# Patient Record
Sex: Female | Born: 1945
Health system: Southern US, Community
[De-identification: ages and names within clinical notes are randomized; demographics above are authoritative.]

## PROBLEM LIST (undated history)

## (undated) DIAGNOSIS — K551 Chronic vascular disorders of intestine: Secondary | ICD-10-CM

## (undated) DIAGNOSIS — I771 Stricture of artery: Secondary | ICD-10-CM

## (undated) DIAGNOSIS — J449 Chronic obstructive pulmonary disease, unspecified: Secondary | ICD-10-CM

## (undated) DIAGNOSIS — I739 Peripheral vascular disease, unspecified: Secondary | ICD-10-CM

## (undated) DIAGNOSIS — U071 COVID-19: Secondary | ICD-10-CM

## (undated) HISTORY — PX: AORTO-FEMORAL BYPASS GRAFT: SHX885

---

## 2013-06-02 DIAGNOSIS — M545 Low back pain: Secondary | ICD-10-CM | POA: Diagnosis not present

## 2013-06-02 DIAGNOSIS — G8929 Other chronic pain: Secondary | ICD-10-CM | POA: Diagnosis not present

## 2013-06-02 DIAGNOSIS — F329 Major depressive disorder, single episode, unspecified: Secondary | ICD-10-CM | POA: Diagnosis not present

## 2013-06-02 DIAGNOSIS — F17200 Nicotine dependence, unspecified, uncomplicated: Secondary | ICD-10-CM | POA: Diagnosis not present

## 2013-06-08 DIAGNOSIS — Z79899 Other long term (current) drug therapy: Secondary | ICD-10-CM | POA: Diagnosis not present

## 2013-06-15 DIAGNOSIS — D649 Anemia, unspecified: Secondary | ICD-10-CM | POA: Diagnosis not present

## 2013-07-01 DIAGNOSIS — D649 Anemia, unspecified: Secondary | ICD-10-CM | POA: Diagnosis not present

## 2013-07-01 DIAGNOSIS — D45 Polycythemia vera: Secondary | ICD-10-CM | POA: Diagnosis not present

## 2013-07-05 DIAGNOSIS — Z0000 Encounter for general adult medical examination without abnormal findings: Secondary | ICD-10-CM | POA: Diagnosis not present

## 2013-07-08 DIAGNOSIS — J40 Bronchitis, not specified as acute or chronic: Secondary | ICD-10-CM | POA: Diagnosis not present

## 2013-07-08 DIAGNOSIS — D45 Polycythemia vera: Secondary | ICD-10-CM | POA: Diagnosis not present

## 2013-07-08 DIAGNOSIS — R0602 Shortness of breath: Secondary | ICD-10-CM | POA: Diagnosis not present

## 2013-07-08 DIAGNOSIS — F17200 Nicotine dependence, unspecified, uncomplicated: Secondary | ICD-10-CM | POA: Diagnosis not present

## 2013-07-27 DIAGNOSIS — I10 Essential (primary) hypertension: Secondary | ICD-10-CM | POA: Diagnosis not present

## 2013-07-27 DIAGNOSIS — R9431 Abnormal electrocardiogram [ECG] [EKG]: Secondary | ICD-10-CM | POA: Diagnosis not present

## 2013-07-27 DIAGNOSIS — F17200 Nicotine dependence, unspecified, uncomplicated: Secondary | ICD-10-CM | POA: Diagnosis not present

## 2013-07-27 DIAGNOSIS — I739 Peripheral vascular disease, unspecified: Secondary | ICD-10-CM | POA: Diagnosis not present

## 2013-08-08 DIAGNOSIS — R9431 Abnormal electrocardiogram [ECG] [EKG]: Secondary | ICD-10-CM | POA: Diagnosis not present

## 2013-08-30 DIAGNOSIS — I119 Hypertensive heart disease without heart failure: Secondary | ICD-10-CM | POA: Diagnosis not present

## 2013-09-27 DIAGNOSIS — I70219 Atherosclerosis of native arteries of extremities with intermittent claudication, unspecified extremity: Secondary | ICD-10-CM | POA: Diagnosis not present

## 2013-09-29 DIAGNOSIS — R9431 Abnormal electrocardiogram [ECG] [EKG]: Secondary | ICD-10-CM | POA: Diagnosis not present

## 2013-09-29 DIAGNOSIS — I739 Peripheral vascular disease, unspecified: Secondary | ICD-10-CM | POA: Diagnosis not present

## 2013-09-29 DIAGNOSIS — I10 Essential (primary) hypertension: Secondary | ICD-10-CM | POA: Diagnosis not present

## 2013-11-04 DIAGNOSIS — I70219 Atherosclerosis of native arteries of extremities with intermittent claudication, unspecified extremity: Secondary | ICD-10-CM | POA: Diagnosis not present

## 2013-11-09 DIAGNOSIS — D45 Polycythemia vera: Secondary | ICD-10-CM | POA: Diagnosis not present

## 2013-11-09 DIAGNOSIS — E785 Hyperlipidemia, unspecified: Secondary | ICD-10-CM | POA: Diagnosis not present

## 2013-11-09 DIAGNOSIS — I10 Essential (primary) hypertension: Secondary | ICD-10-CM | POA: Diagnosis not present

## 2013-11-09 DIAGNOSIS — Z87891 Personal history of nicotine dependence: Secondary | ICD-10-CM | POA: Diagnosis not present

## 2013-11-09 DIAGNOSIS — I739 Peripheral vascular disease, unspecified: Secondary | ICD-10-CM | POA: Diagnosis not present

## 2013-12-07 DIAGNOSIS — I10 Essential (primary) hypertension: Secondary | ICD-10-CM | POA: Diagnosis not present

## 2013-12-07 DIAGNOSIS — I739 Peripheral vascular disease, unspecified: Secondary | ICD-10-CM | POA: Diagnosis not present

## 2013-12-07 DIAGNOSIS — E785 Hyperlipidemia, unspecified: Secondary | ICD-10-CM | POA: Diagnosis not present

## 2013-12-12 DIAGNOSIS — I739 Peripheral vascular disease, unspecified: Secondary | ICD-10-CM | POA: Diagnosis not present

## 2013-12-12 DIAGNOSIS — E785 Hyperlipidemia, unspecified: Secondary | ICD-10-CM | POA: Diagnosis not present

## 2013-12-12 DIAGNOSIS — F17200 Nicotine dependence, unspecified, uncomplicated: Secondary | ICD-10-CM | POA: Diagnosis not present

## 2013-12-12 DIAGNOSIS — I10 Essential (primary) hypertension: Secondary | ICD-10-CM | POA: Diagnosis not present

## 2013-12-26 DIAGNOSIS — Z79899 Other long term (current) drug therapy: Secondary | ICD-10-CM | POA: Diagnosis not present

## 2013-12-26 DIAGNOSIS — F17200 Nicotine dependence, unspecified, uncomplicated: Secondary | ICD-10-CM | POA: Diagnosis not present

## 2013-12-26 DIAGNOSIS — E785 Hyperlipidemia, unspecified: Secondary | ICD-10-CM | POA: Diagnosis not present

## 2013-12-26 DIAGNOSIS — Z7982 Long term (current) use of aspirin: Secondary | ICD-10-CM | POA: Diagnosis not present

## 2013-12-26 DIAGNOSIS — I700 Atherosclerosis of aorta: Secondary | ICD-10-CM | POA: Diagnosis not present

## 2013-12-26 DIAGNOSIS — Z886 Allergy status to analgesic agent status: Secondary | ICD-10-CM | POA: Diagnosis not present

## 2013-12-26 DIAGNOSIS — I70219 Atherosclerosis of native arteries of extremities with intermittent claudication, unspecified extremity: Secondary | ICD-10-CM | POA: Diagnosis not present

## 2013-12-26 DIAGNOSIS — I10 Essential (primary) hypertension: Secondary | ICD-10-CM | POA: Diagnosis not present

## 2013-12-26 DIAGNOSIS — I7409 Other arterial embolism and thrombosis of abdominal aorta: Secondary | ICD-10-CM | POA: Diagnosis not present

## 2013-12-26 DIAGNOSIS — Z7902 Long term (current) use of antithrombotics/antiplatelets: Secondary | ICD-10-CM | POA: Diagnosis not present

## 2013-12-26 DIAGNOSIS — Z91041 Radiographic dye allergy status: Secondary | ICD-10-CM | POA: Diagnosis not present

## 2014-01-05 DIAGNOSIS — I509 Heart failure, unspecified: Secondary | ICD-10-CM | POA: Diagnosis not present

## 2014-01-05 DIAGNOSIS — F17200 Nicotine dependence, unspecified, uncomplicated: Secondary | ICD-10-CM | POA: Diagnosis not present

## 2014-01-05 DIAGNOSIS — I110 Hypertensive heart disease with heart failure: Secondary | ICD-10-CM | POA: Diagnosis not present

## 2014-01-05 DIAGNOSIS — I739 Peripheral vascular disease, unspecified: Secondary | ICD-10-CM | POA: Diagnosis not present

## 2014-01-05 DIAGNOSIS — I5032 Chronic diastolic (congestive) heart failure: Secondary | ICD-10-CM | POA: Diagnosis not present

## 2014-01-12 DIAGNOSIS — I10 Essential (primary) hypertension: Secondary | ICD-10-CM | POA: Diagnosis not present

## 2014-01-12 DIAGNOSIS — R0989 Other specified symptoms and signs involving the circulatory and respiratory systems: Secondary | ICD-10-CM | POA: Diagnosis not present

## 2014-01-12 DIAGNOSIS — R109 Unspecified abdominal pain: Secondary | ICD-10-CM | POA: Diagnosis not present

## 2014-01-12 DIAGNOSIS — Q2529 Other atresia of aorta: Secondary | ICD-10-CM | POA: Diagnosis not present

## 2014-01-12 DIAGNOSIS — I739 Peripheral vascular disease, unspecified: Secondary | ICD-10-CM | POA: Diagnosis not present

## 2014-01-12 DIAGNOSIS — F17200 Nicotine dependence, unspecified, uncomplicated: Secondary | ICD-10-CM | POA: Diagnosis not present

## 2014-01-12 DIAGNOSIS — E785 Hyperlipidemia, unspecified: Secondary | ICD-10-CM | POA: Diagnosis not present

## 2014-01-12 DIAGNOSIS — Q251 Coarctation of aorta: Secondary | ICD-10-CM | POA: Diagnosis not present

## 2014-02-02 DIAGNOSIS — I110 Hypertensive heart disease with heart failure: Secondary | ICD-10-CM | POA: Diagnosis not present

## 2014-02-02 DIAGNOSIS — I739 Peripheral vascular disease, unspecified: Secondary | ICD-10-CM | POA: Diagnosis not present

## 2014-02-02 DIAGNOSIS — I509 Heart failure, unspecified: Secondary | ICD-10-CM | POA: Diagnosis not present

## 2014-02-02 DIAGNOSIS — J449 Chronic obstructive pulmonary disease, unspecified: Secondary | ICD-10-CM | POA: Diagnosis not present

## 2014-02-15 DIAGNOSIS — R109 Unspecified abdominal pain: Secondary | ICD-10-CM | POA: Diagnosis not present

## 2014-02-15 DIAGNOSIS — I658 Occlusion and stenosis of other precerebral arteries: Secondary | ICD-10-CM | POA: Diagnosis not present

## 2014-02-16 DIAGNOSIS — F17200 Nicotine dependence, unspecified, uncomplicated: Secondary | ICD-10-CM | POA: Diagnosis not present

## 2014-02-16 DIAGNOSIS — K551 Chronic vascular disorders of intestine: Secondary | ICD-10-CM | POA: Diagnosis not present

## 2014-02-16 DIAGNOSIS — E785 Hyperlipidemia, unspecified: Secondary | ICD-10-CM | POA: Diagnosis not present

## 2014-02-16 DIAGNOSIS — I739 Peripheral vascular disease, unspecified: Secondary | ICD-10-CM | POA: Diagnosis not present

## 2014-02-16 DIAGNOSIS — I6529 Occlusion and stenosis of unspecified carotid artery: Secondary | ICD-10-CM | POA: Diagnosis not present

## 2014-02-16 DIAGNOSIS — Q251 Coarctation of aorta: Secondary | ICD-10-CM | POA: Diagnosis not present

## 2014-02-16 DIAGNOSIS — Q2529 Other atresia of aorta: Secondary | ICD-10-CM | POA: Diagnosis not present

## 2014-02-16 DIAGNOSIS — I10 Essential (primary) hypertension: Secondary | ICD-10-CM | POA: Diagnosis not present

## 2014-02-21 DIAGNOSIS — I110 Hypertensive heart disease with heart failure: Secondary | ICD-10-CM | POA: Diagnosis not present

## 2014-02-21 DIAGNOSIS — J449 Chronic obstructive pulmonary disease, unspecified: Secondary | ICD-10-CM | POA: Diagnosis not present

## 2014-02-21 DIAGNOSIS — I739 Peripheral vascular disease, unspecified: Secondary | ICD-10-CM | POA: Diagnosis not present

## 2014-02-21 DIAGNOSIS — I509 Heart failure, unspecified: Secondary | ICD-10-CM | POA: Diagnosis not present

## 2014-02-27 DIAGNOSIS — I10 Essential (primary) hypertension: Secondary | ICD-10-CM | POA: Diagnosis not present

## 2014-02-27 DIAGNOSIS — K551 Chronic vascular disorders of intestine: Secondary | ICD-10-CM | POA: Diagnosis not present

## 2014-02-27 DIAGNOSIS — Z5309 Procedure and treatment not carried out because of other contraindication: Secondary | ICD-10-CM | POA: Diagnosis not present

## 2014-02-27 DIAGNOSIS — I739 Peripheral vascular disease, unspecified: Secondary | ICD-10-CM | POA: Diagnosis not present

## 2014-02-27 DIAGNOSIS — E7800 Pure hypercholesterolemia, unspecified: Secondary | ICD-10-CM | POA: Diagnosis not present

## 2014-02-27 DIAGNOSIS — Z886 Allergy status to analgesic agent status: Secondary | ICD-10-CM | POA: Diagnosis not present

## 2014-02-27 DIAGNOSIS — Z01818 Encounter for other preprocedural examination: Secondary | ICD-10-CM | POA: Diagnosis not present

## 2014-02-28 DIAGNOSIS — K551 Chronic vascular disorders of intestine: Secondary | ICD-10-CM | POA: Diagnosis not present

## 2014-02-28 DIAGNOSIS — Z01818 Encounter for other preprocedural examination: Secondary | ICD-10-CM | POA: Diagnosis not present

## 2014-02-28 DIAGNOSIS — I10 Essential (primary) hypertension: Secondary | ICD-10-CM | POA: Diagnosis not present

## 2014-02-28 DIAGNOSIS — I739 Peripheral vascular disease, unspecified: Secondary | ICD-10-CM | POA: Diagnosis not present

## 2014-02-28 DIAGNOSIS — E7800 Pure hypercholesterolemia, unspecified: Secondary | ICD-10-CM | POA: Diagnosis not present

## 2014-02-28 DIAGNOSIS — Z5309 Procedure and treatment not carried out because of other contraindication: Secondary | ICD-10-CM | POA: Diagnosis not present

## 2014-03-01 DIAGNOSIS — R269 Unspecified abnormalities of gait and mobility: Secondary | ICD-10-CM | POA: Diagnosis not present

## 2014-03-01 DIAGNOSIS — F17200 Nicotine dependence, unspecified, uncomplicated: Secondary | ICD-10-CM | POA: Diagnosis not present

## 2014-03-01 DIAGNOSIS — I110 Hypertensive heart disease with heart failure: Secondary | ICD-10-CM | POA: Diagnosis not present

## 2014-03-01 DIAGNOSIS — J449 Chronic obstructive pulmonary disease, unspecified: Secondary | ICD-10-CM | POA: Diagnosis not present

## 2014-03-01 DIAGNOSIS — I5032 Chronic diastolic (congestive) heart failure: Secondary | ICD-10-CM | POA: Diagnosis not present

## 2014-03-01 DIAGNOSIS — I739 Peripheral vascular disease, unspecified: Secondary | ICD-10-CM | POA: Diagnosis not present

## 2014-03-21 DIAGNOSIS — F17200 Nicotine dependence, unspecified, uncomplicated: Secondary | ICD-10-CM | POA: Diagnosis not present

## 2014-03-21 DIAGNOSIS — K551 Chronic vascular disorders of intestine: Secondary | ICD-10-CM | POA: Diagnosis not present

## 2014-03-21 DIAGNOSIS — I70219 Atherosclerosis of native arteries of extremities with intermittent claudication, unspecified extremity: Secondary | ICD-10-CM | POA: Diagnosis not present

## 2014-03-21 DIAGNOSIS — Z79899 Other long term (current) drug therapy: Secondary | ICD-10-CM | POA: Diagnosis not present

## 2014-03-21 DIAGNOSIS — Z90710 Acquired absence of both cervix and uterus: Secondary | ICD-10-CM | POA: Diagnosis not present

## 2014-03-21 DIAGNOSIS — I739 Peripheral vascular disease, unspecified: Secondary | ICD-10-CM | POA: Diagnosis not present

## 2014-03-21 DIAGNOSIS — E785 Hyperlipidemia, unspecified: Secondary | ICD-10-CM | POA: Diagnosis not present

## 2014-03-21 DIAGNOSIS — Z9889 Other specified postprocedural states: Secondary | ICD-10-CM | POA: Diagnosis not present

## 2014-03-21 DIAGNOSIS — I771 Stricture of artery: Secondary | ICD-10-CM | POA: Diagnosis not present

## 2014-03-21 DIAGNOSIS — I6529 Occlusion and stenosis of unspecified carotid artery: Secondary | ICD-10-CM | POA: Diagnosis not present

## 2014-03-21 DIAGNOSIS — Z01818 Encounter for other preprocedural examination: Secondary | ICD-10-CM | POA: Diagnosis not present

## 2014-03-21 DIAGNOSIS — Z7982 Long term (current) use of aspirin: Secondary | ICD-10-CM | POA: Diagnosis not present

## 2014-03-21 DIAGNOSIS — I10 Essential (primary) hypertension: Secondary | ICD-10-CM | POA: Diagnosis not present

## 2014-03-22 DIAGNOSIS — I10 Essential (primary) hypertension: Secondary | ICD-10-CM | POA: Diagnosis not present

## 2014-03-22 DIAGNOSIS — Z79899 Other long term (current) drug therapy: Secondary | ICD-10-CM | POA: Diagnosis not present

## 2014-03-22 DIAGNOSIS — I6529 Occlusion and stenosis of unspecified carotid artery: Secondary | ICD-10-CM | POA: Diagnosis not present

## 2014-03-22 DIAGNOSIS — K551 Chronic vascular disorders of intestine: Secondary | ICD-10-CM | POA: Diagnosis not present

## 2014-03-22 DIAGNOSIS — E785 Hyperlipidemia, unspecified: Secondary | ICD-10-CM | POA: Diagnosis not present

## 2014-03-22 DIAGNOSIS — I739 Peripheral vascular disease, unspecified: Secondary | ICD-10-CM | POA: Diagnosis not present

## 2014-03-22 DIAGNOSIS — F17200 Nicotine dependence, unspecified, uncomplicated: Secondary | ICD-10-CM | POA: Diagnosis not present

## 2014-03-22 DIAGNOSIS — I70219 Atherosclerosis of native arteries of extremities with intermittent claudication, unspecified extremity: Secondary | ICD-10-CM | POA: Diagnosis not present

## 2014-03-30 DIAGNOSIS — I509 Heart failure, unspecified: Secondary | ICD-10-CM | POA: Diagnosis not present

## 2014-03-30 DIAGNOSIS — I110 Hypertensive heart disease with heart failure: Secondary | ICD-10-CM | POA: Diagnosis not present

## 2014-03-30 DIAGNOSIS — I739 Peripheral vascular disease, unspecified: Secondary | ICD-10-CM | POA: Diagnosis not present

## 2014-03-30 DIAGNOSIS — F17200 Nicotine dependence, unspecified, uncomplicated: Secondary | ICD-10-CM | POA: Diagnosis not present

## 2014-04-05 DIAGNOSIS — R197 Diarrhea, unspecified: Secondary | ICD-10-CM | POA: Diagnosis not present

## 2014-04-10 DIAGNOSIS — R634 Abnormal weight loss: Secondary | ICD-10-CM | POA: Diagnosis not present

## 2014-04-10 DIAGNOSIS — I739 Peripheral vascular disease, unspecified: Secondary | ICD-10-CM | POA: Diagnosis not present

## 2014-04-10 DIAGNOSIS — R197 Diarrhea, unspecified: Secondary | ICD-10-CM | POA: Diagnosis not present

## 2014-04-10 DIAGNOSIS — K551 Chronic vascular disorders of intestine: Secondary | ICD-10-CM | POA: Diagnosis not present

## 2014-04-25 DIAGNOSIS — R1031 Right lower quadrant pain: Secondary | ICD-10-CM | POA: Diagnosis not present

## 2014-04-25 DIAGNOSIS — R197 Diarrhea, unspecified: Secondary | ICD-10-CM | POA: Diagnosis not present

## 2014-04-25 DIAGNOSIS — R634 Abnormal weight loss: Secondary | ICD-10-CM | POA: Diagnosis not present

## 2014-04-30 DIAGNOSIS — I5032 Chronic diastolic (congestive) heart failure: Secondary | ICD-10-CM | POA: Diagnosis not present

## 2014-04-30 DIAGNOSIS — I119 Hypertensive heart disease without heart failure: Secondary | ICD-10-CM | POA: Diagnosis not present

## 2014-04-30 DIAGNOSIS — J449 Chronic obstructive pulmonary disease, unspecified: Secondary | ICD-10-CM | POA: Diagnosis not present

## 2014-04-30 DIAGNOSIS — I509 Heart failure, unspecified: Secondary | ICD-10-CM | POA: Diagnosis not present

## 2014-04-30 DIAGNOSIS — B9681 Helicobacter pylori [H. pylori] as the cause of diseases classified elsewhere: Secondary | ICD-10-CM | POA: Diagnosis not present

## 2014-04-30 DIAGNOSIS — I739 Peripheral vascular disease, unspecified: Secondary | ICD-10-CM | POA: Diagnosis not present

## 2014-05-02 DIAGNOSIS — I739 Peripheral vascular disease, unspecified: Secondary | ICD-10-CM | POA: Diagnosis not present

## 2014-05-02 DIAGNOSIS — Q2529 Other atresia of aorta: Secondary | ICD-10-CM | POA: Diagnosis not present

## 2014-05-02 DIAGNOSIS — K551 Chronic vascular disorders of intestine: Secondary | ICD-10-CM | POA: Diagnosis not present

## 2014-05-02 DIAGNOSIS — Q251 Coarctation of aorta: Secondary | ICD-10-CM | POA: Diagnosis not present

## 2014-05-03 DIAGNOSIS — I509 Heart failure, unspecified: Secondary | ICD-10-CM | POA: Diagnosis not present

## 2014-05-03 DIAGNOSIS — I119 Hypertensive heart disease without heart failure: Secondary | ICD-10-CM | POA: Diagnosis not present

## 2014-05-03 DIAGNOSIS — B9681 Helicobacter pylori [H. pylori] as the cause of diseases classified elsewhere: Secondary | ICD-10-CM | POA: Diagnosis not present

## 2014-05-03 DIAGNOSIS — I739 Peripheral vascular disease, unspecified: Secondary | ICD-10-CM | POA: Diagnosis not present

## 2014-05-03 DIAGNOSIS — J449 Chronic obstructive pulmonary disease, unspecified: Secondary | ICD-10-CM | POA: Diagnosis not present

## 2014-05-03 DIAGNOSIS — I5032 Chronic diastolic (congestive) heart failure: Secondary | ICD-10-CM | POA: Diagnosis not present

## 2014-05-04 DIAGNOSIS — J449 Chronic obstructive pulmonary disease, unspecified: Secondary | ICD-10-CM | POA: Diagnosis not present

## 2014-05-04 DIAGNOSIS — I119 Hypertensive heart disease without heart failure: Secondary | ICD-10-CM | POA: Diagnosis not present

## 2014-05-04 DIAGNOSIS — B9681 Helicobacter pylori [H. pylori] as the cause of diseases classified elsewhere: Secondary | ICD-10-CM | POA: Diagnosis not present

## 2014-05-04 DIAGNOSIS — I5032 Chronic diastolic (congestive) heart failure: Secondary | ICD-10-CM | POA: Diagnosis not present

## 2014-05-04 DIAGNOSIS — I739 Peripheral vascular disease, unspecified: Secondary | ICD-10-CM | POA: Diagnosis not present

## 2014-05-04 DIAGNOSIS — I509 Heart failure, unspecified: Secondary | ICD-10-CM | POA: Diagnosis not present

## 2014-05-05 DIAGNOSIS — I739 Peripheral vascular disease, unspecified: Secondary | ICD-10-CM | POA: Diagnosis not present

## 2014-05-05 DIAGNOSIS — I509 Heart failure, unspecified: Secondary | ICD-10-CM | POA: Diagnosis not present

## 2014-05-05 DIAGNOSIS — B9681 Helicobacter pylori [H. pylori] as the cause of diseases classified elsewhere: Secondary | ICD-10-CM | POA: Diagnosis not present

## 2014-05-05 DIAGNOSIS — J449 Chronic obstructive pulmonary disease, unspecified: Secondary | ICD-10-CM | POA: Diagnosis not present

## 2014-05-05 DIAGNOSIS — I5032 Chronic diastolic (congestive) heart failure: Secondary | ICD-10-CM | POA: Diagnosis not present

## 2014-05-05 DIAGNOSIS — I119 Hypertensive heart disease without heart failure: Secondary | ICD-10-CM | POA: Diagnosis not present

## 2014-05-09 DIAGNOSIS — J449 Chronic obstructive pulmonary disease, unspecified: Secondary | ICD-10-CM | POA: Diagnosis not present

## 2014-05-09 DIAGNOSIS — I739 Peripheral vascular disease, unspecified: Secondary | ICD-10-CM | POA: Diagnosis not present

## 2014-05-09 DIAGNOSIS — B9681 Helicobacter pylori [H. pylori] as the cause of diseases classified elsewhere: Secondary | ICD-10-CM | POA: Diagnosis not present

## 2014-05-09 DIAGNOSIS — I509 Heart failure, unspecified: Secondary | ICD-10-CM | POA: Diagnosis not present

## 2014-05-09 DIAGNOSIS — I5032 Chronic diastolic (congestive) heart failure: Secondary | ICD-10-CM | POA: Diagnosis not present

## 2014-05-09 DIAGNOSIS — I119 Hypertensive heart disease without heart failure: Secondary | ICD-10-CM | POA: Diagnosis not present

## 2014-05-10 DIAGNOSIS — R197 Diarrhea, unspecified: Secondary | ICD-10-CM | POA: Diagnosis not present

## 2014-05-11 DIAGNOSIS — I5032 Chronic diastolic (congestive) heart failure: Secondary | ICD-10-CM | POA: Diagnosis not present

## 2014-05-11 DIAGNOSIS — I739 Peripheral vascular disease, unspecified: Secondary | ICD-10-CM | POA: Diagnosis not present

## 2014-05-11 DIAGNOSIS — B9681 Helicobacter pylori [H. pylori] as the cause of diseases classified elsewhere: Secondary | ICD-10-CM | POA: Diagnosis not present

## 2014-05-11 DIAGNOSIS — I509 Heart failure, unspecified: Secondary | ICD-10-CM | POA: Diagnosis not present

## 2014-05-11 DIAGNOSIS — J449 Chronic obstructive pulmonary disease, unspecified: Secondary | ICD-10-CM | POA: Diagnosis not present

## 2014-05-11 DIAGNOSIS — I119 Hypertensive heart disease without heart failure: Secondary | ICD-10-CM | POA: Diagnosis not present

## 2014-05-12 DIAGNOSIS — I509 Heart failure, unspecified: Secondary | ICD-10-CM | POA: Diagnosis not present

## 2014-05-12 DIAGNOSIS — I739 Peripheral vascular disease, unspecified: Secondary | ICD-10-CM | POA: Diagnosis not present

## 2014-05-12 DIAGNOSIS — I119 Hypertensive heart disease without heart failure: Secondary | ICD-10-CM | POA: Diagnosis not present

## 2014-05-12 DIAGNOSIS — B9681 Helicobacter pylori [H. pylori] as the cause of diseases classified elsewhere: Secondary | ICD-10-CM | POA: Diagnosis not present

## 2014-05-12 DIAGNOSIS — I5032 Chronic diastolic (congestive) heart failure: Secondary | ICD-10-CM | POA: Diagnosis not present

## 2014-05-12 DIAGNOSIS — J449 Chronic obstructive pulmonary disease, unspecified: Secondary | ICD-10-CM | POA: Diagnosis not present

## 2014-05-17 DIAGNOSIS — I739 Peripheral vascular disease, unspecified: Secondary | ICD-10-CM | POA: Diagnosis not present

## 2014-05-17 DIAGNOSIS — I119 Hypertensive heart disease without heart failure: Secondary | ICD-10-CM | POA: Diagnosis not present

## 2014-05-17 DIAGNOSIS — J449 Chronic obstructive pulmonary disease, unspecified: Secondary | ICD-10-CM | POA: Diagnosis not present

## 2014-05-17 DIAGNOSIS — I509 Heart failure, unspecified: Secondary | ICD-10-CM | POA: Diagnosis not present

## 2014-05-17 DIAGNOSIS — I5032 Chronic diastolic (congestive) heart failure: Secondary | ICD-10-CM | POA: Diagnosis not present

## 2014-05-17 DIAGNOSIS — B9681 Helicobacter pylori [H. pylori] as the cause of diseases classified elsewhere: Secondary | ICD-10-CM | POA: Diagnosis not present

## 2014-05-18 DIAGNOSIS — I509 Heart failure, unspecified: Secondary | ICD-10-CM | POA: Diagnosis not present

## 2014-05-18 DIAGNOSIS — J449 Chronic obstructive pulmonary disease, unspecified: Secondary | ICD-10-CM | POA: Diagnosis not present

## 2014-05-18 DIAGNOSIS — I119 Hypertensive heart disease without heart failure: Secondary | ICD-10-CM | POA: Diagnosis not present

## 2014-05-18 DIAGNOSIS — I5032 Chronic diastolic (congestive) heart failure: Secondary | ICD-10-CM | POA: Diagnosis not present

## 2014-05-18 DIAGNOSIS — I739 Peripheral vascular disease, unspecified: Secondary | ICD-10-CM | POA: Diagnosis not present

## 2014-05-18 DIAGNOSIS — B9681 Helicobacter pylori [H. pylori] as the cause of diseases classified elsewhere: Secondary | ICD-10-CM | POA: Diagnosis not present

## 2014-05-19 DIAGNOSIS — I5032 Chronic diastolic (congestive) heart failure: Secondary | ICD-10-CM | POA: Diagnosis not present

## 2014-05-19 DIAGNOSIS — I739 Peripheral vascular disease, unspecified: Secondary | ICD-10-CM | POA: Diagnosis not present

## 2014-05-19 DIAGNOSIS — B9681 Helicobacter pylori [H. pylori] as the cause of diseases classified elsewhere: Secondary | ICD-10-CM | POA: Diagnosis not present

## 2014-05-19 DIAGNOSIS — J449 Chronic obstructive pulmonary disease, unspecified: Secondary | ICD-10-CM | POA: Diagnosis not present

## 2014-05-19 DIAGNOSIS — I509 Heart failure, unspecified: Secondary | ICD-10-CM | POA: Diagnosis not present

## 2014-05-19 DIAGNOSIS — I119 Hypertensive heart disease without heart failure: Secondary | ICD-10-CM | POA: Diagnosis not present

## 2014-05-22 DIAGNOSIS — I5032 Chronic diastolic (congestive) heart failure: Secondary | ICD-10-CM | POA: Diagnosis not present

## 2014-05-22 DIAGNOSIS — I119 Hypertensive heart disease without heart failure: Secondary | ICD-10-CM | POA: Diagnosis not present

## 2014-05-22 DIAGNOSIS — B9681 Helicobacter pylori [H. pylori] as the cause of diseases classified elsewhere: Secondary | ICD-10-CM | POA: Diagnosis not present

## 2014-05-22 DIAGNOSIS — J449 Chronic obstructive pulmonary disease, unspecified: Secondary | ICD-10-CM | POA: Diagnosis not present

## 2014-05-22 DIAGNOSIS — I509 Heart failure, unspecified: Secondary | ICD-10-CM | POA: Diagnosis not present

## 2014-05-22 DIAGNOSIS — I739 Peripheral vascular disease, unspecified: Secondary | ICD-10-CM | POA: Diagnosis not present

## 2014-05-23 DIAGNOSIS — I119 Hypertensive heart disease without heart failure: Secondary | ICD-10-CM | POA: Diagnosis not present

## 2014-05-23 DIAGNOSIS — I509 Heart failure, unspecified: Secondary | ICD-10-CM | POA: Diagnosis not present

## 2014-05-23 DIAGNOSIS — I739 Peripheral vascular disease, unspecified: Secondary | ICD-10-CM | POA: Diagnosis not present

## 2014-05-23 DIAGNOSIS — B9681 Helicobacter pylori [H. pylori] as the cause of diseases classified elsewhere: Secondary | ICD-10-CM | POA: Diagnosis not present

## 2014-05-23 DIAGNOSIS — I5032 Chronic diastolic (congestive) heart failure: Secondary | ICD-10-CM | POA: Diagnosis not present

## 2014-05-23 DIAGNOSIS — J449 Chronic obstructive pulmonary disease, unspecified: Secondary | ICD-10-CM | POA: Diagnosis not present

## 2014-05-25 DIAGNOSIS — I10 Essential (primary) hypertension: Secondary | ICD-10-CM | POA: Diagnosis not present

## 2014-05-25 DIAGNOSIS — J449 Chronic obstructive pulmonary disease, unspecified: Secondary | ICD-10-CM | POA: Diagnosis not present

## 2014-05-25 DIAGNOSIS — I739 Peripheral vascular disease, unspecified: Secondary | ICD-10-CM | POA: Diagnosis not present

## 2014-05-25 DIAGNOSIS — I771 Stricture of artery: Secondary | ICD-10-CM | POA: Diagnosis not present

## 2014-05-25 DIAGNOSIS — I509 Heart failure, unspecified: Secondary | ICD-10-CM | POA: Diagnosis not present

## 2014-05-25 DIAGNOSIS — B9681 Helicobacter pylori [H. pylori] as the cause of diseases classified elsewhere: Secondary | ICD-10-CM | POA: Diagnosis not present

## 2014-05-25 DIAGNOSIS — I119 Hypertensive heart disease without heart failure: Secondary | ICD-10-CM | POA: Diagnosis not present

## 2014-05-25 DIAGNOSIS — I5032 Chronic diastolic (congestive) heart failure: Secondary | ICD-10-CM | POA: Diagnosis not present

## 2014-05-25 DIAGNOSIS — Q253 Supravalvular aortic stenosis: Secondary | ICD-10-CM | POA: Diagnosis not present

## 2014-05-26 DIAGNOSIS — I739 Peripheral vascular disease, unspecified: Secondary | ICD-10-CM | POA: Diagnosis not present

## 2014-05-26 DIAGNOSIS — I5032 Chronic diastolic (congestive) heart failure: Secondary | ICD-10-CM | POA: Diagnosis not present

## 2014-05-26 DIAGNOSIS — J449 Chronic obstructive pulmonary disease, unspecified: Secondary | ICD-10-CM | POA: Diagnosis not present

## 2014-05-26 DIAGNOSIS — A09 Infectious gastroenteritis and colitis, unspecified: Secondary | ICD-10-CM | POA: Diagnosis not present

## 2014-05-26 DIAGNOSIS — I509 Heart failure, unspecified: Secondary | ICD-10-CM | POA: Diagnosis not present

## 2014-05-26 DIAGNOSIS — B9681 Helicobacter pylori [H. pylori] as the cause of diseases classified elsewhere: Secondary | ICD-10-CM | POA: Diagnosis not present

## 2014-05-26 DIAGNOSIS — I119 Hypertensive heart disease without heart failure: Secondary | ICD-10-CM | POA: Diagnosis not present

## 2014-05-30 DIAGNOSIS — I739 Peripheral vascular disease, unspecified: Secondary | ICD-10-CM | POA: Diagnosis not present

## 2014-05-30 DIAGNOSIS — I119 Hypertensive heart disease without heart failure: Secondary | ICD-10-CM | POA: Diagnosis not present

## 2014-05-30 DIAGNOSIS — B9681 Helicobacter pylori [H. pylori] as the cause of diseases classified elsewhere: Secondary | ICD-10-CM | POA: Diagnosis not present

## 2014-05-30 DIAGNOSIS — J449 Chronic obstructive pulmonary disease, unspecified: Secondary | ICD-10-CM | POA: Diagnosis not present

## 2014-05-30 DIAGNOSIS — I5032 Chronic diastolic (congestive) heart failure: Secondary | ICD-10-CM | POA: Diagnosis not present

## 2014-05-30 DIAGNOSIS — I509 Heart failure, unspecified: Secondary | ICD-10-CM | POA: Diagnosis not present

## 2014-06-01 DIAGNOSIS — I509 Heart failure, unspecified: Secondary | ICD-10-CM | POA: Diagnosis not present

## 2014-06-01 DIAGNOSIS — J449 Chronic obstructive pulmonary disease, unspecified: Secondary | ICD-10-CM | POA: Diagnosis not present

## 2014-06-01 DIAGNOSIS — I119 Hypertensive heart disease without heart failure: Secondary | ICD-10-CM | POA: Diagnosis not present

## 2014-06-01 DIAGNOSIS — I739 Peripheral vascular disease, unspecified: Secondary | ICD-10-CM | POA: Diagnosis not present

## 2014-06-01 DIAGNOSIS — B9681 Helicobacter pylori [H. pylori] as the cause of diseases classified elsewhere: Secondary | ICD-10-CM | POA: Diagnosis not present

## 2014-06-01 DIAGNOSIS — I5032 Chronic diastolic (congestive) heart failure: Secondary | ICD-10-CM | POA: Diagnosis not present

## 2014-06-02 DIAGNOSIS — I119 Hypertensive heart disease without heart failure: Secondary | ICD-10-CM | POA: Diagnosis not present

## 2014-06-02 DIAGNOSIS — B9681 Helicobacter pylori [H. pylori] as the cause of diseases classified elsewhere: Secondary | ICD-10-CM | POA: Diagnosis not present

## 2014-06-02 DIAGNOSIS — I509 Heart failure, unspecified: Secondary | ICD-10-CM | POA: Diagnosis not present

## 2014-06-02 DIAGNOSIS — I5032 Chronic diastolic (congestive) heart failure: Secondary | ICD-10-CM | POA: Diagnosis not present

## 2014-06-02 DIAGNOSIS — J449 Chronic obstructive pulmonary disease, unspecified: Secondary | ICD-10-CM | POA: Diagnosis not present

## 2014-06-02 DIAGNOSIS — I739 Peripheral vascular disease, unspecified: Secondary | ICD-10-CM | POA: Diagnosis not present

## 2014-06-05 DIAGNOSIS — J449 Chronic obstructive pulmonary disease, unspecified: Secondary | ICD-10-CM | POA: Diagnosis not present

## 2014-06-05 DIAGNOSIS — I739 Peripheral vascular disease, unspecified: Secondary | ICD-10-CM | POA: Diagnosis not present

## 2014-06-05 DIAGNOSIS — I119 Hypertensive heart disease without heart failure: Secondary | ICD-10-CM | POA: Diagnosis not present

## 2014-06-05 DIAGNOSIS — I5032 Chronic diastolic (congestive) heart failure: Secondary | ICD-10-CM | POA: Diagnosis not present

## 2014-06-05 DIAGNOSIS — B9681 Helicobacter pylori [H. pylori] as the cause of diseases classified elsewhere: Secondary | ICD-10-CM | POA: Diagnosis not present

## 2014-06-05 DIAGNOSIS — I509 Heart failure, unspecified: Secondary | ICD-10-CM | POA: Diagnosis not present

## 2014-06-07 DIAGNOSIS — I739 Peripheral vascular disease, unspecified: Secondary | ICD-10-CM | POA: Diagnosis not present

## 2014-06-07 DIAGNOSIS — I5032 Chronic diastolic (congestive) heart failure: Secondary | ICD-10-CM | POA: Diagnosis not present

## 2014-06-07 DIAGNOSIS — B9681 Helicobacter pylori [H. pylori] as the cause of diseases classified elsewhere: Secondary | ICD-10-CM | POA: Diagnosis not present

## 2014-06-07 DIAGNOSIS — I509 Heart failure, unspecified: Secondary | ICD-10-CM | POA: Diagnosis not present

## 2014-06-07 DIAGNOSIS — I119 Hypertensive heart disease without heart failure: Secondary | ICD-10-CM | POA: Diagnosis not present

## 2014-06-07 DIAGNOSIS — J449 Chronic obstructive pulmonary disease, unspecified: Secondary | ICD-10-CM | POA: Diagnosis not present

## 2014-06-08 DIAGNOSIS — J449 Chronic obstructive pulmonary disease, unspecified: Secondary | ICD-10-CM | POA: Diagnosis not present

## 2014-06-08 DIAGNOSIS — I119 Hypertensive heart disease without heart failure: Secondary | ICD-10-CM | POA: Diagnosis not present

## 2014-06-08 DIAGNOSIS — I739 Peripheral vascular disease, unspecified: Secondary | ICD-10-CM | POA: Diagnosis not present

## 2014-06-08 DIAGNOSIS — I509 Heart failure, unspecified: Secondary | ICD-10-CM | POA: Diagnosis not present

## 2014-06-08 DIAGNOSIS — I5032 Chronic diastolic (congestive) heart failure: Secondary | ICD-10-CM | POA: Diagnosis not present

## 2014-06-08 DIAGNOSIS — B9681 Helicobacter pylori [H. pylori] as the cause of diseases classified elsewhere: Secondary | ICD-10-CM | POA: Diagnosis not present

## 2014-06-09 DIAGNOSIS — I739 Peripheral vascular disease, unspecified: Secondary | ICD-10-CM | POA: Diagnosis not present

## 2014-06-09 DIAGNOSIS — B9681 Helicobacter pylori [H. pylori] as the cause of diseases classified elsewhere: Secondary | ICD-10-CM | POA: Diagnosis not present

## 2014-06-09 DIAGNOSIS — I5032 Chronic diastolic (congestive) heart failure: Secondary | ICD-10-CM | POA: Diagnosis not present

## 2014-06-09 DIAGNOSIS — J449 Chronic obstructive pulmonary disease, unspecified: Secondary | ICD-10-CM | POA: Diagnosis not present

## 2014-06-09 DIAGNOSIS — I509 Heart failure, unspecified: Secondary | ICD-10-CM | POA: Diagnosis not present

## 2014-06-09 DIAGNOSIS — I119 Hypertensive heart disease without heart failure: Secondary | ICD-10-CM | POA: Diagnosis not present

## 2014-06-14 DIAGNOSIS — I509 Heart failure, unspecified: Secondary | ICD-10-CM | POA: Diagnosis not present

## 2014-06-14 DIAGNOSIS — J449 Chronic obstructive pulmonary disease, unspecified: Secondary | ICD-10-CM | POA: Diagnosis not present

## 2014-06-14 DIAGNOSIS — B9681 Helicobacter pylori [H. pylori] as the cause of diseases classified elsewhere: Secondary | ICD-10-CM | POA: Diagnosis not present

## 2014-06-14 DIAGNOSIS — I119 Hypertensive heart disease without heart failure: Secondary | ICD-10-CM | POA: Diagnosis not present

## 2014-06-14 DIAGNOSIS — I5032 Chronic diastolic (congestive) heart failure: Secondary | ICD-10-CM | POA: Diagnosis not present

## 2014-06-14 DIAGNOSIS — I739 Peripheral vascular disease, unspecified: Secondary | ICD-10-CM | POA: Diagnosis not present

## 2014-06-16 DIAGNOSIS — J449 Chronic obstructive pulmonary disease, unspecified: Secondary | ICD-10-CM | POA: Diagnosis not present

## 2014-06-16 DIAGNOSIS — R1032 Left lower quadrant pain: Secondary | ICD-10-CM | POA: Diagnosis not present

## 2014-06-16 DIAGNOSIS — I509 Heart failure, unspecified: Secondary | ICD-10-CM | POA: Diagnosis not present

## 2014-06-16 DIAGNOSIS — I5032 Chronic diastolic (congestive) heart failure: Secondary | ICD-10-CM | POA: Diagnosis not present

## 2014-06-16 DIAGNOSIS — R197 Diarrhea, unspecified: Secondary | ICD-10-CM | POA: Diagnosis not present

## 2014-06-16 DIAGNOSIS — B9681 Helicobacter pylori [H. pylori] as the cause of diseases classified elsewhere: Secondary | ICD-10-CM | POA: Diagnosis not present

## 2014-06-16 DIAGNOSIS — I739 Peripheral vascular disease, unspecified: Secondary | ICD-10-CM | POA: Diagnosis not present

## 2014-06-16 DIAGNOSIS — I119 Hypertensive heart disease without heart failure: Secondary | ICD-10-CM | POA: Diagnosis not present

## 2014-06-16 DIAGNOSIS — R634 Abnormal weight loss: Secondary | ICD-10-CM | POA: Diagnosis not present

## 2014-06-20 DIAGNOSIS — I119 Hypertensive heart disease without heart failure: Secondary | ICD-10-CM | POA: Diagnosis not present

## 2014-06-20 DIAGNOSIS — B9681 Helicobacter pylori [H. pylori] as the cause of diseases classified elsewhere: Secondary | ICD-10-CM | POA: Diagnosis not present

## 2014-06-20 DIAGNOSIS — I5032 Chronic diastolic (congestive) heart failure: Secondary | ICD-10-CM | POA: Diagnosis not present

## 2014-06-20 DIAGNOSIS — I509 Heart failure, unspecified: Secondary | ICD-10-CM | POA: Diagnosis not present

## 2014-06-20 DIAGNOSIS — J449 Chronic obstructive pulmonary disease, unspecified: Secondary | ICD-10-CM | POA: Diagnosis not present

## 2014-06-20 DIAGNOSIS — I739 Peripheral vascular disease, unspecified: Secondary | ICD-10-CM | POA: Diagnosis not present

## 2014-06-22 DIAGNOSIS — I509 Heart failure, unspecified: Secondary | ICD-10-CM | POA: Diagnosis not present

## 2014-06-22 DIAGNOSIS — I119 Hypertensive heart disease without heart failure: Secondary | ICD-10-CM | POA: Diagnosis not present

## 2014-06-22 DIAGNOSIS — I5032 Chronic diastolic (congestive) heart failure: Secondary | ICD-10-CM | POA: Diagnosis not present

## 2014-06-22 DIAGNOSIS — J449 Chronic obstructive pulmonary disease, unspecified: Secondary | ICD-10-CM | POA: Diagnosis not present

## 2014-06-22 DIAGNOSIS — I739 Peripheral vascular disease, unspecified: Secondary | ICD-10-CM | POA: Diagnosis not present

## 2014-06-22 DIAGNOSIS — B9681 Helicobacter pylori [H. pylori] as the cause of diseases classified elsewhere: Secondary | ICD-10-CM | POA: Diagnosis not present

## 2014-06-27 DIAGNOSIS — J449 Chronic obstructive pulmonary disease, unspecified: Secondary | ICD-10-CM | POA: Diagnosis not present

## 2014-06-27 DIAGNOSIS — I509 Heart failure, unspecified: Secondary | ICD-10-CM | POA: Diagnosis not present

## 2014-06-27 DIAGNOSIS — I5032 Chronic diastolic (congestive) heart failure: Secondary | ICD-10-CM | POA: Diagnosis not present

## 2014-06-27 DIAGNOSIS — B9681 Helicobacter pylori [H. pylori] as the cause of diseases classified elsewhere: Secondary | ICD-10-CM | POA: Diagnosis not present

## 2014-06-27 DIAGNOSIS — I119 Hypertensive heart disease without heart failure: Secondary | ICD-10-CM | POA: Diagnosis not present

## 2014-06-27 DIAGNOSIS — I739 Peripheral vascular disease, unspecified: Secondary | ICD-10-CM | POA: Diagnosis not present

## 2014-07-04 DIAGNOSIS — J449 Chronic obstructive pulmonary disease, unspecified: Secondary | ICD-10-CM | POA: Diagnosis not present

## 2014-07-04 DIAGNOSIS — I110 Hypertensive heart disease with heart failure: Secondary | ICD-10-CM | POA: Diagnosis not present

## 2014-07-04 DIAGNOSIS — I5032 Chronic diastolic (congestive) heart failure: Secondary | ICD-10-CM | POA: Diagnosis not present

## 2014-07-04 DIAGNOSIS — I509 Heart failure, unspecified: Secondary | ICD-10-CM | POA: Diagnosis not present

## 2014-07-11 DIAGNOSIS — R1033 Periumbilical pain: Secondary | ICD-10-CM | POA: Diagnosis not present

## 2014-07-11 DIAGNOSIS — I700 Atherosclerosis of aorta: Secondary | ICD-10-CM | POA: Diagnosis not present

## 2014-07-11 DIAGNOSIS — K5790 Diverticulosis of intestine, part unspecified, without perforation or abscess without bleeding: Secondary | ICD-10-CM | POA: Diagnosis not present

## 2014-07-11 DIAGNOSIS — R1031 Right lower quadrant pain: Secondary | ICD-10-CM | POA: Diagnosis not present

## 2014-07-11 DIAGNOSIS — R634 Abnormal weight loss: Secondary | ICD-10-CM | POA: Diagnosis not present

## 2014-07-11 DIAGNOSIS — R1030 Lower abdominal pain, unspecified: Secondary | ICD-10-CM | POA: Diagnosis not present

## 2014-07-24 DIAGNOSIS — I70213 Atherosclerosis of native arteries of extremities with intermittent claudication, bilateral legs: Secondary | ICD-10-CM | POA: Diagnosis not present

## 2014-07-24 DIAGNOSIS — F17210 Nicotine dependence, cigarettes, uncomplicated: Secondary | ICD-10-CM | POA: Diagnosis not present

## 2014-07-24 DIAGNOSIS — I771 Stricture of artery: Secondary | ICD-10-CM | POA: Diagnosis not present

## 2014-07-24 DIAGNOSIS — E785 Hyperlipidemia, unspecified: Secondary | ICD-10-CM | POA: Diagnosis not present

## 2014-07-24 DIAGNOSIS — I10 Essential (primary) hypertension: Secondary | ICD-10-CM | POA: Diagnosis not present

## 2014-09-13 DIAGNOSIS — I70203 Unspecified atherosclerosis of native arteries of extremities, bilateral legs: Secondary | ICD-10-CM | POA: Diagnosis not present

## 2014-09-13 DIAGNOSIS — K551 Chronic vascular disorders of intestine: Secondary | ICD-10-CM | POA: Diagnosis not present

## 2014-09-21 DIAGNOSIS — I739 Peripheral vascular disease, unspecified: Secondary | ICD-10-CM | POA: Diagnosis not present

## 2014-09-21 DIAGNOSIS — Z95820 Peripheral vascular angioplasty status with implants and grafts: Secondary | ICD-10-CM | POA: Diagnosis not present

## 2014-09-21 DIAGNOSIS — I70213 Atherosclerosis of native arteries of extremities with intermittent claudication, bilateral legs: Secondary | ICD-10-CM | POA: Diagnosis not present

## 2014-09-21 DIAGNOSIS — Q253 Supravalvular aortic stenosis: Secondary | ICD-10-CM | POA: Diagnosis not present

## 2014-09-21 DIAGNOSIS — I771 Stricture of artery: Secondary | ICD-10-CM | POA: Diagnosis not present

## 2014-09-21 DIAGNOSIS — R197 Diarrhea, unspecified: Secondary | ICD-10-CM | POA: Diagnosis not present

## 2014-09-29 DIAGNOSIS — I739 Peripheral vascular disease, unspecified: Secondary | ICD-10-CM | POA: Diagnosis not present

## 2014-09-29 DIAGNOSIS — E559 Vitamin D deficiency, unspecified: Secondary | ICD-10-CM | POA: Diagnosis not present

## 2014-09-29 DIAGNOSIS — J449 Chronic obstructive pulmonary disease, unspecified: Secondary | ICD-10-CM | POA: Diagnosis not present

## 2014-09-29 DIAGNOSIS — Z0000 Encounter for general adult medical examination without abnormal findings: Secondary | ICD-10-CM | POA: Diagnosis not present

## 2014-09-29 DIAGNOSIS — G629 Polyneuropathy, unspecified: Secondary | ICD-10-CM | POA: Diagnosis not present

## 2014-09-29 DIAGNOSIS — I110 Hypertensive heart disease with heart failure: Secondary | ICD-10-CM | POA: Diagnosis not present

## 2014-09-29 DIAGNOSIS — Z79899 Other long term (current) drug therapy: Secondary | ICD-10-CM | POA: Diagnosis not present

## 2015-01-04 DIAGNOSIS — I70203 Unspecified atherosclerosis of native arteries of extremities, bilateral legs: Secondary | ICD-10-CM | POA: Diagnosis not present

## 2015-01-25 DIAGNOSIS — I771 Stricture of artery: Secondary | ICD-10-CM | POA: Diagnosis not present

## 2015-01-25 DIAGNOSIS — I70213 Atherosclerosis of native arteries of extremities with intermittent claudication, bilateral legs: Secondary | ICD-10-CM | POA: Diagnosis not present

## 2015-01-25 DIAGNOSIS — Q253 Supravalvular aortic stenosis: Secondary | ICD-10-CM | POA: Diagnosis not present

## 2015-01-25 DIAGNOSIS — I739 Peripheral vascular disease, unspecified: Secondary | ICD-10-CM | POA: Diagnosis not present

## 2015-01-25 DIAGNOSIS — E785 Hyperlipidemia, unspecified: Secondary | ICD-10-CM | POA: Diagnosis not present

## 2015-01-25 DIAGNOSIS — I10 Essential (primary) hypertension: Secondary | ICD-10-CM | POA: Diagnosis not present

## 2015-02-01 DIAGNOSIS — Q253 Supravalvular aortic stenosis: Secondary | ICD-10-CM | POA: Diagnosis not present

## 2015-02-01 DIAGNOSIS — I701 Atherosclerosis of renal artery: Secondary | ICD-10-CM | POA: Diagnosis not present

## 2015-02-01 DIAGNOSIS — I739 Peripheral vascular disease, unspecified: Secondary | ICD-10-CM | POA: Diagnosis not present

## 2015-02-27 DIAGNOSIS — Z01810 Encounter for preprocedural cardiovascular examination: Secondary | ICD-10-CM | POA: Diagnosis not present

## 2015-03-15 DIAGNOSIS — I7410 Embolism and thrombosis of unspecified parts of aorta: Secondary | ICD-10-CM | POA: Diagnosis not present

## 2015-03-15 DIAGNOSIS — I771 Stricture of artery: Secondary | ICD-10-CM | POA: Diagnosis not present

## 2015-03-15 DIAGNOSIS — I70213 Atherosclerosis of native arteries of extremities with intermittent claudication, bilateral legs: Secondary | ICD-10-CM | POA: Diagnosis not present

## 2015-03-15 DIAGNOSIS — I739 Peripheral vascular disease, unspecified: Secondary | ICD-10-CM | POA: Diagnosis not present

## 2015-04-04 DIAGNOSIS — Z01818 Encounter for other preprocedural examination: Secondary | ICD-10-CM | POA: Diagnosis not present

## 2015-04-04 DIAGNOSIS — I7410 Embolism and thrombosis of unspecified parts of aorta: Secondary | ICD-10-CM | POA: Diagnosis not present

## 2015-05-01 DIAGNOSIS — I70223 Atherosclerosis of native arteries of extremities with rest pain, bilateral legs: Secondary | ICD-10-CM | POA: Diagnosis not present

## 2015-05-01 DIAGNOSIS — I7410 Embolism and thrombosis of unspecified parts of aorta: Secondary | ICD-10-CM | POA: Diagnosis not present

## 2015-05-02 DIAGNOSIS — E872 Acidosis: Secondary | ICD-10-CM | POA: Diagnosis not present

## 2015-05-02 DIAGNOSIS — R0600 Dyspnea, unspecified: Secondary | ICD-10-CM | POA: Diagnosis not present

## 2015-05-02 DIAGNOSIS — I70301 Unspecified atherosclerosis of unspecified type of bypass graft(s) of the extremities, right leg: Secondary | ICD-10-CM | POA: Diagnosis not present

## 2015-05-02 DIAGNOSIS — E46 Unspecified protein-calorie malnutrition: Secondary | ICD-10-CM | POA: Diagnosis not present

## 2015-05-02 DIAGNOSIS — J9601 Acute respiratory failure with hypoxia: Secondary | ICD-10-CM | POA: Diagnosis not present

## 2015-05-02 DIAGNOSIS — D751 Secondary polycythemia: Secondary | ICD-10-CM | POA: Diagnosis present

## 2015-05-02 DIAGNOSIS — Z681 Body mass index (BMI) 19 or less, adult: Secondary | ICD-10-CM | POA: Diagnosis not present

## 2015-05-02 DIAGNOSIS — I70223 Atherosclerosis of native arteries of extremities with rest pain, bilateral legs: Secondary | ICD-10-CM | POA: Diagnosis not present

## 2015-05-02 DIAGNOSIS — I10 Essential (primary) hypertension: Secondary | ICD-10-CM | POA: Diagnosis present

## 2015-05-02 DIAGNOSIS — Z4682 Encounter for fitting and adjustment of non-vascular catheter: Secondary | ICD-10-CM | POA: Diagnosis not present

## 2015-05-02 DIAGNOSIS — Q253 Supravalvular aortic stenosis: Secondary | ICD-10-CM | POA: Diagnosis not present

## 2015-05-02 DIAGNOSIS — I7409 Other arterial embolism and thrombosis of abdominal aorta: Secondary | ICD-10-CM | POA: Diagnosis not present

## 2015-05-02 DIAGNOSIS — D72829 Elevated white blood cell count, unspecified: Secondary | ICD-10-CM | POA: Diagnosis not present

## 2015-05-02 DIAGNOSIS — Z9889 Other specified postprocedural states: Secondary | ICD-10-CM | POA: Diagnosis not present

## 2015-05-02 DIAGNOSIS — D696 Thrombocytopenia, unspecified: Secondary | ICD-10-CM | POA: Diagnosis not present

## 2015-05-02 DIAGNOSIS — I700 Atherosclerosis of aorta: Secondary | ICD-10-CM | POA: Diagnosis not present

## 2015-05-02 DIAGNOSIS — I70203 Unspecified atherosclerosis of native arteries of extremities, bilateral legs: Secondary | ICD-10-CM | POA: Diagnosis not present

## 2015-05-02 DIAGNOSIS — J189 Pneumonia, unspecified organism: Secondary | ICD-10-CM | POA: Diagnosis not present

## 2015-05-02 DIAGNOSIS — T82868A Thrombosis of vascular prosthetic devices, implants and grafts, initial encounter: Secondary | ICD-10-CM | POA: Diagnosis not present

## 2015-05-02 DIAGNOSIS — J90 Pleural effusion, not elsewhere classified: Secondary | ICD-10-CM | POA: Diagnosis not present

## 2015-05-02 DIAGNOSIS — D649 Anemia, unspecified: Secondary | ICD-10-CM | POA: Diagnosis not present

## 2015-05-02 DIAGNOSIS — Z01818 Encounter for other preprocedural examination: Secondary | ICD-10-CM | POA: Diagnosis not present

## 2015-05-02 DIAGNOSIS — R739 Hyperglycemia, unspecified: Secondary | ICD-10-CM | POA: Diagnosis not present

## 2015-05-02 DIAGNOSIS — N179 Acute kidney failure, unspecified: Secondary | ICD-10-CM | POA: Diagnosis not present

## 2015-05-02 DIAGNOSIS — E870 Hyperosmolality and hypernatremia: Secondary | ICD-10-CM | POA: Diagnosis not present

## 2015-05-02 DIAGNOSIS — F17210 Nicotine dependence, cigarettes, uncomplicated: Secondary | ICD-10-CM | POA: Diagnosis present

## 2015-05-02 DIAGNOSIS — J439 Emphysema, unspecified: Secondary | ICD-10-CM | POA: Diagnosis not present

## 2015-05-02 DIAGNOSIS — I7410 Embolism and thrombosis of unspecified parts of aorta: Secondary | ICD-10-CM | POA: Diagnosis not present

## 2015-05-02 DIAGNOSIS — I998 Other disorder of circulatory system: Secondary | ICD-10-CM | POA: Diagnosis not present

## 2015-05-02 DIAGNOSIS — E2740 Unspecified adrenocortical insufficiency: Secondary | ICD-10-CM | POA: Diagnosis not present

## 2015-05-02 DIAGNOSIS — A419 Sepsis, unspecified organism: Secondary | ICD-10-CM | POA: Diagnosis not present

## 2015-05-02 DIAGNOSIS — Z95828 Presence of other vascular implants and grafts: Secondary | ICD-10-CM | POA: Diagnosis not present

## 2015-05-02 DIAGNOSIS — G92 Toxic encephalopathy: Secondary | ICD-10-CM | POA: Diagnosis not present

## 2015-05-02 DIAGNOSIS — T4275XA Adverse effect of unspecified antiepileptic and sedative-hypnotic drugs, initial encounter: Secondary | ICD-10-CM | POA: Diagnosis not present

## 2015-05-02 DIAGNOSIS — I70302 Unspecified atherosclerosis of unspecified type of bypass graft(s) of the extremities, left leg: Secondary | ICD-10-CM | POA: Diagnosis not present

## 2015-05-02 DIAGNOSIS — E876 Hypokalemia: Secondary | ICD-10-CM | POA: Diagnosis not present

## 2015-05-02 HISTORY — DX: Emphysema, unspecified: J43.9

## 2015-05-06 DIAGNOSIS — Q253 Supravalvular aortic stenosis: Secondary | ICD-10-CM | POA: Diagnosis not present

## 2015-05-10 ENCOUNTER — Other Ambulatory Visit (HOSPITAL_COMMUNITY): Payer: Self-pay

## 2015-05-10 ENCOUNTER — Inpatient Hospital Stay
Admission: AD | Admit: 2015-05-10 | Discharge: 2015-06-01 | Disposition: A | Discharge status: Skilled Nursing Facility | Payer: Medicare Other | Source: Ambulatory Visit | Attending: Internal Medicine | Admitting: Internal Medicine

## 2015-05-10 DIAGNOSIS — J9601 Acute respiratory failure with hypoxia: Secondary | ICD-10-CM | POA: Diagnosis not present

## 2015-05-10 DIAGNOSIS — Z720 Tobacco use: Secondary | ICD-10-CM | POA: Diagnosis not present

## 2015-05-10 DIAGNOSIS — Z87898 Personal history of other specified conditions: Secondary | ICD-10-CM | POA: Diagnosis not present

## 2015-05-10 DIAGNOSIS — J189 Pneumonia, unspecified organism: Secondary | ICD-10-CM | POA: Diagnosis present

## 2015-05-10 DIAGNOSIS — I7410 Embolism and thrombosis of unspecified parts of aorta: Secondary | ICD-10-CM | POA: Diagnosis not present

## 2015-05-10 DIAGNOSIS — R319 Hematuria, unspecified: Secondary | ICD-10-CM | POA: Diagnosis not present

## 2015-05-10 DIAGNOSIS — K5900 Constipation, unspecified: Secondary | ICD-10-CM | POA: Diagnosis present

## 2015-05-10 DIAGNOSIS — I739 Peripheral vascular disease, unspecified: Secondary | ICD-10-CM

## 2015-05-10 DIAGNOSIS — I70213 Atherosclerosis of native arteries of extremities with intermittent claudication, bilateral legs: Secondary | ICD-10-CM | POA: Diagnosis not present

## 2015-05-10 DIAGNOSIS — J439 Emphysema, unspecified: Secondary | ICD-10-CM | POA: Diagnosis not present

## 2015-05-10 DIAGNOSIS — R531 Weakness: Secondary | ICD-10-CM | POA: Diagnosis not present

## 2015-05-10 DIAGNOSIS — R8290 Unspecified abnormal findings in urine: Secondary | ICD-10-CM

## 2015-05-10 DIAGNOSIS — J9811 Atelectasis: Secondary | ICD-10-CM | POA: Diagnosis not present

## 2015-05-10 DIAGNOSIS — Z9911 Dependence on respirator [ventilator] status: Secondary | ICD-10-CM

## 2015-05-10 DIAGNOSIS — J9690 Respiratory failure, unspecified, unspecified whether with hypoxia or hypercapnia: Secondary | ICD-10-CM

## 2015-05-10 DIAGNOSIS — I10 Essential (primary) hypertension: Secondary | ICD-10-CM | POA: Diagnosis not present

## 2015-05-10 DIAGNOSIS — R739 Hyperglycemia, unspecified: Secondary | ICD-10-CM | POA: Diagnosis present

## 2015-05-10 DIAGNOSIS — I2510 Atherosclerotic heart disease of native coronary artery without angina pectoris: Secondary | ICD-10-CM | POA: Diagnosis present

## 2015-05-10 DIAGNOSIS — E43 Unspecified severe protein-calorie malnutrition: Secondary | ICD-10-CM | POA: Diagnosis not present

## 2015-05-10 DIAGNOSIS — J9621 Acute and chronic respiratory failure with hypoxia: Secondary | ICD-10-CM | POA: Diagnosis not present

## 2015-05-10 DIAGNOSIS — D638 Anemia in other chronic diseases classified elsewhere: Secondary | ICD-10-CM | POA: Diagnosis present

## 2015-05-10 DIAGNOSIS — J811 Chronic pulmonary edema: Secondary | ICD-10-CM | POA: Diagnosis not present

## 2015-05-10 DIAGNOSIS — Z87891 Personal history of nicotine dependence: Secondary | ICD-10-CM

## 2015-05-10 DIAGNOSIS — Z66 Do not resuscitate: Secondary | ICD-10-CM | POA: Diagnosis present

## 2015-05-10 DIAGNOSIS — J9600 Acute respiratory failure, unspecified whether with hypoxia or hypercapnia: Secondary | ICD-10-CM

## 2015-05-10 DIAGNOSIS — B964 Proteus (mirabilis) (morganii) as the cause of diseases classified elsewhere: Secondary | ICD-10-CM | POA: Diagnosis not present

## 2015-05-10 DIAGNOSIS — R1310 Dysphagia, unspecified: Secondary | ICD-10-CM | POA: Diagnosis present

## 2015-05-10 DIAGNOSIS — N390 Urinary tract infection, site not specified: Secondary | ICD-10-CM | POA: Diagnosis not present

## 2015-05-10 DIAGNOSIS — T8131XA Disruption of external operation (surgical) wound, not elsewhere classified, initial encounter: Secondary | ICD-10-CM | POA: Diagnosis not present

## 2015-05-10 DIAGNOSIS — J449 Chronic obstructive pulmonary disease, unspecified: Secondary | ICD-10-CM | POA: Diagnosis not present

## 2015-05-10 DIAGNOSIS — H3530 Unspecified macular degeneration: Secondary | ICD-10-CM

## 2015-05-10 DIAGNOSIS — F418 Other specified anxiety disorders: Secondary | ICD-10-CM | POA: Diagnosis present

## 2015-05-10 DIAGNOSIS — E46 Unspecified protein-calorie malnutrition: Secondary | ICD-10-CM | POA: Diagnosis not present

## 2015-05-10 DIAGNOSIS — J432 Centrilobular emphysema: Secondary | ICD-10-CM | POA: Diagnosis not present

## 2015-05-10 DIAGNOSIS — E870 Hyperosmolality and hypernatremia: Secondary | ICD-10-CM | POA: Diagnosis present

## 2015-05-10 DIAGNOSIS — Z4682 Encounter for fitting and adjustment of non-vascular catheter: Secondary | ICD-10-CM | POA: Diagnosis not present

## 2015-05-10 DIAGNOSIS — M1990 Unspecified osteoarthritis, unspecified site: Secondary | ICD-10-CM

## 2015-05-10 DIAGNOSIS — Z452 Encounter for adjustment and management of vascular access device: Secondary | ICD-10-CM

## 2015-05-10 DIAGNOSIS — E876 Hypokalemia: Secondary | ICD-10-CM | POA: Diagnosis present

## 2015-05-10 DIAGNOSIS — Z95828 Presence of other vascular implants and grafts: Secondary | ICD-10-CM | POA: Diagnosis not present

## 2015-05-10 DIAGNOSIS — Z48812 Encounter for surgical aftercare following surgery on the circulatory system: Secondary | ICD-10-CM | POA: Diagnosis not present

## 2015-05-10 DIAGNOSIS — Z9889 Other specified postprocedural states: Secondary | ICD-10-CM | POA: Diagnosis not present

## 2015-05-10 DIAGNOSIS — J95821 Acute postprocedural respiratory failure: Secondary | ICD-10-CM | POA: Diagnosis not present

## 2015-05-10 DIAGNOSIS — I70209 Unspecified atherosclerosis of native arteries of extremities, unspecified extremity: Secondary | ICD-10-CM

## 2015-05-10 DIAGNOSIS — Z4659 Encounter for fitting and adjustment of other gastrointestinal appliance and device: Secondary | ICD-10-CM

## 2015-05-10 DIAGNOSIS — I70203 Unspecified atherosclerosis of native arteries of extremities, bilateral legs: Secondary | ICD-10-CM | POA: Diagnosis not present

## 2015-05-10 HISTORY — DX: Chronic obstructive pulmonary disease, unspecified: J44.9

## 2015-05-10 HISTORY — DX: Stricture of artery: I77.1

## 2015-05-10 HISTORY — DX: Peripheral vascular disease, unspecified: I73.9

## 2015-05-10 HISTORY — DX: Chronic vascular disorders of intestine: K55.1

## 2015-05-11 ENCOUNTER — Other Ambulatory Visit (HOSPITAL_COMMUNITY): Payer: Self-pay

## 2015-05-11 DIAGNOSIS — Z9911 Dependence on respirator [ventilator] status: Secondary | ICD-10-CM

## 2015-05-11 DIAGNOSIS — H3530 Unspecified macular degeneration: Secondary | ICD-10-CM

## 2015-05-11 DIAGNOSIS — I739 Peripheral vascular disease, unspecified: Secondary | ICD-10-CM

## 2015-05-11 DIAGNOSIS — J449 Chronic obstructive pulmonary disease, unspecified: Secondary | ICD-10-CM

## 2015-05-11 DIAGNOSIS — I70209 Unspecified atherosclerosis of native arteries of extremities, unspecified extremity: Secondary | ICD-10-CM

## 2015-05-11 DIAGNOSIS — Z720 Tobacco use: Secondary | ICD-10-CM

## 2015-05-11 DIAGNOSIS — J9690 Respiratory failure, unspecified, unspecified whether with hypoxia or hypercapnia: Secondary | ICD-10-CM | POA: Diagnosis not present

## 2015-05-11 DIAGNOSIS — Z87891 Personal history of nicotine dependence: Secondary | ICD-10-CM

## 2015-05-11 DIAGNOSIS — J95821 Acute postprocedural respiratory failure: Secondary | ICD-10-CM

## 2015-05-11 DIAGNOSIS — M1990 Unspecified osteoarthritis, unspecified site: Secondary | ICD-10-CM

## 2015-05-11 LAB — BLOOD GAS, ARTERIAL
ACID-BASE EXCESS: 9.4 mmol/L — AB (ref 0.0–2.0)
Bicarbonate: 32.7 mEq/L — ABNORMAL HIGH (ref 20.0–24.0)
FIO2: 0.4
LHR: 10 {breaths}/min
O2 SAT: 97.6 %
PATIENT TEMPERATURE: 98.6
PCO2 ART: 37.6 mmHg (ref 35.0–45.0)
PEEP/CPAP: 10 cmH2O
PH ART: 7.548 — AB (ref 7.350–7.450)
PO2 ART: 91.6 mmHg (ref 80.0–100.0)
TCO2: 33.8 mmol/L (ref 0–100)
VT: 500 mL

## 2015-05-11 LAB — COMPREHENSIVE METABOLIC PANEL
ALBUMIN: 2.4 g/dL — AB (ref 3.5–5.0)
ALT: 20 U/L (ref 14–54)
AST: 42 U/L — AB (ref 15–41)
Alkaline Phosphatase: 88 U/L (ref 38–126)
Anion gap: 10 (ref 5–15)
BUN: 26 mg/dL — AB (ref 6–20)
CHLORIDE: 108 mmol/L (ref 101–111)
CO2: 32 mmol/L (ref 22–32)
CREATININE: 0.53 mg/dL (ref 0.44–1.00)
Calcium: 8.3 mg/dL — ABNORMAL LOW (ref 8.9–10.3)
GFR calc Af Amer: >60 mL/min (ref >60)
GLUCOSE: 236 mg/dL — AB (ref 65–99)
Potassium: 3.2 mmol/L — ABNORMAL LOW (ref 3.5–5.1)
Sodium: 150 mmol/L — ABNORMAL HIGH (ref 135–145)
Total Bilirubin: 1.1 mg/dL (ref 0.3–1.2)
Total Protein: 5.3 g/dL — ABNORMAL LOW (ref 6.5–8.1)

## 2015-05-11 LAB — CBC
HEMATOCRIT: 29 % — AB (ref 36.0–46.0)
Hemoglobin: 9.5 g/dL — ABNORMAL LOW (ref 12.0–15.0)
MCH: 32.6 pg (ref 26.0–34.0)
MCHC: 32.8 g/dL (ref 30.0–36.0)
MCV: 99.7 fL (ref 78.0–100.0)
PLATELETS: 84 10*3/uL — AB (ref 150–400)
RBC: 2.91 MIL/uL — AB (ref 3.87–5.11)
RDW: 18.4 % — ABNORMAL HIGH (ref 11.5–15.5)
WBC: 11.7 10*3/uL — AB (ref 4.0–10.5)

## 2015-05-11 LAB — MAGNESIUM: MAGNESIUM: 2 mg/dL (ref 1.7–2.4)

## 2015-05-11 LAB — BRAIN NATRIURETIC PEPTIDE: B Natriuretic Peptide: 581.7 pg/mL — ABNORMAL HIGH (ref 0.0–100.0)

## 2015-05-11 NOTE — Consult Note (Signed)
Name: Kelly Phillips MRN: [id number] DOB: 11-16-1945    ADMISSION DATE:  05/10/2015 CONSULTATION DATE:  9/16  REFERRING MD :  [facility]  CHIEF COMPLAINT:  VDRF  BRIEF PATIENT DESCRIPTION: 69 yo on vent  SIGNIFICANT EVENTS    STUDIES:     HISTORY OF PRESENT ILLNESS:   69 yo female , life long smoker, who underwent aortic - bi femoral grafting 04/26/15 at [facility] and was extubated post op. She required return to OR for thrombectomy and sustained substantial blood loss requiring transfusion and fluid resuscitation which led to massive volume overload. She was transferred to [facility], intubated with FIO2 needs of 40%  , right IJ CVL, sedated on diprivan but awake and following commands. CxR consistent with multifocal pna and PCCM asked to assist with vent management. Please note there was no discharge summary available at time of this note. Information was gleaned from chart and [facility] MD. Note she is on stress steroids without any documentation. She has been life long smoker and her c x r is consistent with copd.  PAST MEDICAL HISTORY :   has no past medical history on file.  has no past surgical history on file. Prior to Admission medications   Reviewed     FAMILY HISTORY:  family history is not on file. SOCIAL HISTORY:  Life long smoker  REVIEW OF SYSTEMS:   Na  VITAL SIGNS: Vital signs reviewed. Abnormal values will appear under impression plan section.    Vent:    PHYSICAL EXAMINATION: General:  EWF Sedated on vent Neuro:  Follows comands HEENT:  OTT-> vent , OGT-> TF Cardiovascular:  HSR RRR Lungs:  Decreased in bases, faint exp wheeze Abdomen:  Dressings intact abd/bifem with serous drainage    Musculoskeletal:  Intact Skin:  Warm and dry, toes warm with good cap fill   Recent Labs Lab 05/11/15 0600  NA 150*  K 3.2*  CL 108  CO2 32  BUN 26*  CREATININE 0.53  GLUCOSE 236*    Recent Labs Lab 05/11/15 0600  HGB 9.5*  HCT 29.0*  WBC 11.7*  PLT 84*   Dg  Chest Port 1 View  05/11/2015   CLINICAL DATA:  Respiratory failure.  EXAM: PORTABLE CHEST - 1 VIEW  COMPARISON:  None.  FINDINGS: Endotracheal tube, NG tube, right IJ line in good anatomic position. Heart size normal. Multifocal bilateral pulmonary infiltrates particularly prominent in the right lung base. Findings consist with multifocal pneumonia. No pleural effusion or pneumothorax. No acute osseous abnormality.  IMPRESSION: 1. Lines and tubes in good anatomic position. 2. Multifocal bilateral pulmonary infiltrates, particular prominent in the right lung base. Findings consistent with multifocal pneumonia.   Electronically Signed   By: Marcello Moores  Register   On: 05/11/2015 07:25   Dg Abd Portable 1v  05/10/2015   CLINICAL DATA:  Enteric tube placement.  EXAM: PORTABLE ABDOMEN - 1 VIEW  COMPARISON:  None.  FINDINGS: Exam demonstrates an enteric tube with tip over the left mid to lower abdomen and side-port in the left mid to upper abdomen. Bowel gas pattern is nonobstructive. There are surgical clips over the midline abdomen as well as skin staples vertically just right of midline. Metallic stent like structure over the upper abdomen just left of midline. Mild degenerate change of the spine.  IMPRESSION: Nonobstructive bowel gas pattern.  Enteric tube with tip over the left mid to lower abdomen and side-port over the mid to upper abdomen.   Electronically Signed   By: Quillian Quince  Derrel Nip M.D.   On: 05/10/2015 23:52   Discussion: 69 yo female , life long smoker, who underwent aortic - bi femoral grafting 04/26/15 at [facility] and was extubated post op. She required return to OR for thrombectomy and sustained substantial blood loss requiring transfusion and fluid resuscitation which led to massive volume overload. She was transferred to [facility], intubated with FIO2 needs of 40%  , right IJ CVL, sedated on diprivan but awake and following commands. CxR consistent with multifocal pna and PCCM asked to assist with vent management.  Please note there was no discharge summary available at time of this note. Information was gleaned from chart and  [facility] MD. Note she is on stress steroids without any documentation. She has been life long smoker and her c x r is consistent with copd. ASSESSMENT:    Ventilator dependence, reintubated 9/7 for return to OR for thrombectomy, Massive volume overload, may need trach, suspected pna   Tobacco abuse , life long smoker, suspected COPD   Atherosclerotic peripheral vascular disease, post AF bypass 9/6 with return to OR 9/7 for thrombectomy   Arthritis   Macular degeneration   PLAN: Wean per protocol Agressive diuresis as tolerated, creatine 0.53 BD as needed Wean steroids as tolerated  Abx per [facility] PCCM Pager [phone] till 3 pm If no answer page [phone] 05/11/2015, 10:48 AM

## 2015-05-12 LAB — BASIC METABOLIC PANEL
ANION GAP: 10 (ref 5–15)
BUN: 35 mg/dL — ABNORMAL HIGH (ref 6–20)
CALCIUM: 8.4 mg/dL — AB (ref 8.9–10.3)
CO2: 36 mmol/L — ABNORMAL HIGH (ref 22–32)
CREATININE: 0.72 mg/dL (ref 0.44–1.00)
Chloride: 107 mmol/L (ref 101–111)
Glucose, Bld: 127 mg/dL — ABNORMAL HIGH (ref 65–99)
Potassium: 3.6 mmol/L (ref 3.5–5.1)
SODIUM: 153 mmol/L — AB (ref 135–145)

## 2015-05-12 LAB — CBC
HEMATOCRIT: 29.1 % — AB (ref 36.0–46.0)
HEMOGLOBIN: 9.3 g/dL — AB (ref 12.0–15.0)
MCH: 32.6 pg (ref 26.0–34.0)
MCHC: 32 g/dL (ref 30.0–36.0)
MCV: 102.1 fL — ABNORMAL HIGH (ref 78.0–100.0)
Platelets: 97 10*3/uL — ABNORMAL LOW (ref 150–400)
RBC: 2.85 MIL/uL — ABNORMAL LOW (ref 3.87–5.11)
RDW: 18.2 % — AB (ref 11.5–15.5)
WBC: 12.7 10*3/uL — AB (ref 4.0–10.5)

## 2015-05-12 LAB — BRAIN NATRIURETIC PEPTIDE: B NATRIURETIC PEPTIDE 5: 428.6 pg/mL — AB (ref 0.0–100.0)

## 2015-05-13 ENCOUNTER — Other Ambulatory Visit (HOSPITAL_COMMUNITY): Payer: Self-pay

## 2015-05-13 DIAGNOSIS — J9690 Respiratory failure, unspecified, unspecified whether with hypoxia or hypercapnia: Secondary | ICD-10-CM | POA: Diagnosis not present

## 2015-05-13 LAB — BASIC METABOLIC PANEL
Anion gap: 9 (ref 5–15)
BUN: 29 mg/dL — AB (ref 6–20)
CHLORIDE: 105 mmol/L (ref 101–111)
CO2: 40 mmol/L — AB (ref 22–32)
CREATININE: 0.6 mg/dL (ref 0.44–1.00)
Calcium: 8.3 mg/dL — ABNORMAL LOW (ref 8.9–10.3)
GFR calc Af Amer: >60 mL/min (ref >60)
GFR calc non Af Amer: >60 mL/min (ref >60)
GLUCOSE: 164 mg/dL — AB (ref 65–99)
POTASSIUM: 2.6 mmol/L — AB (ref 3.5–5.1)
SODIUM: 154 mmol/L — AB (ref 135–145)

## 2015-05-13 LAB — CBC WITH DIFFERENTIAL/PLATELET
Basophils Absolute: 0 10*3/uL (ref 0.0–0.1)
Basophils Relative: 0 %
EOS ABS: 0 10*3/uL (ref 0.0–0.7)
Eosinophils Relative: 0 %
HCT: 27.6 % — ABNORMAL LOW (ref 36.0–46.0)
HEMOGLOBIN: 8.4 g/dL — AB (ref 12.0–15.0)
LYMPHS ABS: 0.5 10*3/uL — AB (ref 0.7–4.0)
LYMPHS PCT: 4 %
MCH: 31.2 pg (ref 26.0–34.0)
MCHC: 30.4 g/dL (ref 30.0–36.0)
MCV: 102.6 fL — AB (ref 78.0–100.0)
MONOS PCT: 3 %
Monocytes Absolute: 0.4 10*3/uL (ref 0.1–1.0)
NEUTROS PCT: 92 %
Neutro Abs: 10.6 10*3/uL — ABNORMAL HIGH (ref 1.7–7.7)
Platelets: 113 10*3/uL — ABNORMAL LOW (ref 150–400)
RBC: 2.69 MIL/uL — ABNORMAL LOW (ref 3.87–5.11)
RDW: 17.7 % — ABNORMAL HIGH (ref 11.5–15.5)
WBC: 11.6 10*3/uL — ABNORMAL HIGH (ref 4.0–10.5)

## 2015-05-14 LAB — BRAIN NATRIURETIC PEPTIDE: B NATRIURETIC PEPTIDE 5: 440.9 pg/mL — AB (ref 0.0–100.0)

## 2015-05-14 LAB — CBC
HEMATOCRIT: 25.7 % — AB (ref 36.0–46.0)
Hemoglobin: 7.9 g/dL — ABNORMAL LOW (ref 12.0–15.0)
MCH: 32 pg (ref 26.0–34.0)
MCHC: 30.7 g/dL (ref 30.0–36.0)
MCV: 104 fL — ABNORMAL HIGH (ref 78.0–100.0)
PLATELETS: 135 10*3/uL — AB (ref 150–400)
RBC: 2.47 MIL/uL — ABNORMAL LOW (ref 3.87–5.11)
RDW: 17.6 % — AB (ref 11.5–15.5)
WBC: 12.1 10*3/uL — AB (ref 4.0–10.5)

## 2015-05-14 LAB — BASIC METABOLIC PANEL
ANION GAP: 8 (ref 5–15)
BUN: 30 mg/dL — ABNORMAL HIGH (ref 6–20)
CALCIUM: 8.8 mg/dL — AB (ref 8.9–10.3)
CO2: 36 mmol/L — ABNORMAL HIGH (ref 22–32)
Chloride: 109 mmol/L (ref 101–111)
Creatinine, Ser: 0.58 mg/dL (ref 0.44–1.00)
Glucose, Bld: 158 mg/dL — ABNORMAL HIGH (ref 65–99)
Potassium: 4.6 mmol/L (ref 3.5–5.1)
Sodium: 153 mmol/L — ABNORMAL HIGH (ref 135–145)

## 2015-05-14 LAB — PHOSPHORUS: Phosphorus: 4.5 mg/dL (ref 2.5–4.6)

## 2015-05-14 LAB — ALBUMIN: Albumin: 2.1 g/dL — ABNORMAL LOW (ref 3.5–5.0)

## 2015-05-14 LAB — MAGNESIUM: MAGNESIUM: 2.1 mg/dL (ref 1.7–2.4)

## 2015-05-15 ENCOUNTER — Encounter: Payer: Self-pay | Admitting: Pulmonary Disease

## 2015-05-15 DIAGNOSIS — Z720 Tobacco use: Secondary | ICD-10-CM

## 2015-05-15 DIAGNOSIS — J432 Centrilobular emphysema: Secondary | ICD-10-CM

## 2015-05-15 DIAGNOSIS — J9621 Acute and chronic respiratory failure with hypoxia: Secondary | ICD-10-CM

## 2015-05-15 DIAGNOSIS — Z9911 Dependence on respirator [ventilator] status: Secondary | ICD-10-CM

## 2015-05-15 NOTE — Progress Notes (Signed)
PCCM PROGRESS NOTE  ADMISSION DATE: 05/10/2015 CONSULT DATE: 05/11/2015 REFERRING PROVIDER: Dr. Laren Everts  CC: Failure to wean from vent  SUBJECTIVE: Tolerating pressure support.  Denies chest pain.  C/o back discomfort from laying in bed.  OBJECTIVE: SpO2 100%, HR 101, BP 108/64  General: pleasant HEENT: ETT in place Cardiac: regular, tachycardic Chest: no wheeze Abd: soft, non tender Ext: wound vac in place Neuro: alert, follows commands Skin:no rashes   CMP Latest Ref Rng 05/14/2015 05/13/2015 05/12/2015  Glucose 65 - 99 mg/dL 158(H) 164(H) 127(H)  BUN 6 - 20 mg/dL 30(H) 29(H) 35(H)  Creatinine 0.44 - 1.00 mg/dL 0.58 0.60 0.72  Sodium 135 - 145 mmol/L 153(H) 154(H) 153(H)  Potassium 3.5 - 5.1 mmol/L 4.6 2.6(LL) 3.6  Chloride 101 - 111 mmol/L 109 105 107  CO2 22 - 32 mmol/L 36(H) 40(H) 36(H)  Calcium 8.9 - 10.3 mg/dL 8.8(L) 8.3(L) 8.4(L)  Total Protein 6.5 - 8.1 g/dL - - -  Total Bilirubin 0.3 - 1.2 mg/dL - - -  Alkaline Phos 38 - 126 U/L - - -  AST 15 - 41 U/L - - -  ALT 14 - 54 U/L - - -     CBC Latest Ref Rng 05/14/2015 05/13/2015 05/12/2015  WBC 4.0 - 10.5 K/uL 12.1(H) 11.6(H) 12.7(H)  Hemoglobin 12.0 - 15.0 g/dL 7.9(L) 8.4(L) 9.3(L)  Hematocrit 36.0 - 46.0 % 25.7(L) 27.6(L) 29.1(L)  Platelets 150 - 400 K/uL 135(L) 113(L) 97(L)     No results found.  DISCUSSION: 69 yo female smoker s/p aortobifemoral bypass 04/26/15 at [facility], and re-admitted for acute thrombosis with cold extremity.  She had b/l thrombectomy on 05/02/15, but was not able to be weaned of vent post-op.  She developed HCAP.  She was transferred to [facility] for further vent weaning.  She has hx of COPD/emphysema.  ASSESSMENT/PLAN:  Acute on chronic respiratory failure. Plan: - proceed with extubation 9/20 - adjust oxygen to keep SpO2 90 to 95% - prn BiPAP post-extubation - even to negative fluid balance as tolerated  HCAP. Plan: - f/u CXR intermittently - Abx per primary  team  COPD/emphysema Plan: - continue scheduled BDs  Tobacco abuse. Plan: - smoking cessation  PAD s/p aortobifemoral bypass complicated by acute thrombosis. Plan: - per primary team   CC time 34 minutes.  Chesley Mires, MD [facility] Pulmonary/Critical Care 05/15/2015, 8:46 AM Pager:  [phone] After 3pm call: [phone]

## 2015-05-16 ENCOUNTER — Other Ambulatory Visit (HOSPITAL_COMMUNITY): Payer: Self-pay

## 2015-05-16 DIAGNOSIS — J439 Emphysema, unspecified: Secondary | ICD-10-CM

## 2015-05-16 LAB — BASIC METABOLIC PANEL
ANION GAP: 8 (ref 5–15)
BUN: 22 mg/dL — AB (ref 6–20)
CHLORIDE: 105 mmol/L (ref 101–111)
CO2: 34 mmol/L — ABNORMAL HIGH (ref 22–32)
Calcium: 8.4 mg/dL — ABNORMAL LOW (ref 8.9–10.3)
Creatinine, Ser: 0.57 mg/dL (ref 0.44–1.00)
GFR calc non Af Amer: >60 mL/min (ref >60)
Glucose, Bld: 88 mg/dL (ref 65–99)
POTASSIUM: 2.8 mmol/L — AB (ref 3.5–5.1)
SODIUM: 147 mmol/L — AB (ref 135–145)

## 2015-05-16 LAB — URINALYSIS, ROUTINE W REFLEX MICROSCOPIC
Bilirubin Urine: NEGATIVE
Glucose, UA: NEGATIVE mg/dL
Ketones, ur: NEGATIVE mg/dL
NITRITE: NEGATIVE
Specific Gravity, Urine: 1.019 (ref 1.005–1.030)
UROBILINOGEN UA: 1 mg/dL (ref 0.0–1.0)
pH: 8.5 — ABNORMAL HIGH (ref 5.0–8.0)

## 2015-05-16 LAB — URINE MICROSCOPIC-ADD ON

## 2015-05-16 NOTE — Progress Notes (Signed)
PCCM PROGRESS NOTE  ADMISSION DATE: 05/10/2015 CONSULT DATE: 05/11/2015 REFERRING PROVIDER: Dr. Laren Everts  CC: Failure to wean from vent  SUBJECTIVE: Has mild throat irritation.  Denies chest pain, or wheeze.  OBJECTIVE: SpO2 100%, HR 76, BP 104/68  General: pleasant HEENT: no stridor Cardiac: regular Chest: no wheeze Abd: soft, non tender Ext: wound vac in place Neuro: alert, follows commands Skin: no rashes   CMP Latest Ref Rng 05/16/2015 05/14/2015 05/13/2015  Glucose 65 - 99 mg/dL 88 158(H) 164(H)  BUN 6 - 20 mg/dL 22(H) 30(H) 29(H)  Creatinine 0.44 - 1.00 mg/dL 0.57 0.58 0.60  Sodium 135 - 145 mmol/L 147(H) 153(H) 154(H)  Potassium 3.5 - 5.1 mmol/L 2.8(L) 4.6 2.6(LL)  Chloride 101 - 111 mmol/L 105 109 105  CO2 22 - 32 mmol/L 34(H) 36(H) 40(H)  Calcium 8.9 - 10.3 mg/dL 8.4(L) 8.8(L) 8.3(L)  Total Protein 6.5 - 8.1 g/dL - - -  Total Bilirubin 0.3 - 1.2 mg/dL - - -  Alkaline Phos 38 - 126 U/L - - -  AST 15 - 41 U/L - - -  ALT 14 - 54 U/L - - -     CBC Latest Ref Rng 05/14/2015 05/13/2015 05/12/2015  WBC 4.0 - 10.5 K/uL 12.1(H) 11.6(H) 12.7(H)  Hemoglobin 12.0 - 15.0 g/dL 7.9(L) 8.4(L) 9.3(L)  Hematocrit 36.0 - 46.0 % 25.7(L) 27.6(L) 29.1(L)  Platelets 150 - 400 K/uL 135(L) 113(L) 97(L)     No results found.  DISCUSSION: 69 yo female smoker s/p aortobifemoral bypass 04/26/15 at [facility], and re-admitted for acute thrombosis with cold extremity.  She had b/l thrombectomy on 05/02/15, but was not able to be weaned of vent post-op.  She developed HCAP.  She was transferred to [facility] for further vent weaning.  She has hx of COPD/emphysema.  ASSESSMENT/PLAN:  Acute on chronic respiratory failure >> extubated 9/20. Plan: - adjust oxygen to keep SpO2 90 to 95% - prn BiPAP post-extubation - even to negative fluid balance as tolerated  HCAP. Plan: - f/u CXR intermittently - Abx per primary team  COPD/emphysema Plan: - continue scheduled BDs >> likely can transition  from nebulizer to inhaler medication soon  Tobacco abuse. Plan: - smoking cessation  PAD s/p aortobifemoral bypass complicated by acute thrombosis. Plan: - per primary team   PCCM will sign off.  Please call if additional help is needed.  Chesley Mires, MD [facility] Pulmonary/Critical Care 05/16/2015, 10:14 AM Pager:  [phone] After 3pm call: [phone]

## 2015-05-17 LAB — CBC
HCT: 24.3 % — ABNORMAL LOW (ref 36.0–46.0)
Hemoglobin: 7.7 g/dL — ABNORMAL LOW (ref 12.0–15.0)
MCH: 31.3 pg (ref 26.0–34.0)
MCHC: 31.7 g/dL (ref 30.0–36.0)
MCV: 98.8 fL (ref 78.0–100.0)
PLATELETS: 220 10*3/uL (ref 150–400)
RBC: 2.46 MIL/uL — AB (ref 3.87–5.11)
RDW: 17.6 % — AB (ref 11.5–15.5)
WBC: 7.1 10*3/uL (ref 4.0–10.5)

## 2015-05-17 LAB — BASIC METABOLIC PANEL
ANION GAP: 5 (ref 5–15)
BUN: 19 mg/dL (ref 6–20)
CALCIUM: 8.5 mg/dL — AB (ref 8.9–10.3)
CO2: 30 mmol/L (ref 22–32)
Chloride: 106 mmol/L (ref 101–111)
Creatinine, Ser: 0.57 mg/dL (ref 0.44–1.00)
GFR calc Af Amer: >60 mL/min (ref >60)
Glucose, Bld: 96 mg/dL (ref 65–99)
POTASSIUM: 4.1 mmol/L (ref 3.5–5.1)
SODIUM: 141 mmol/L (ref 135–145)

## 2015-05-18 ENCOUNTER — Other Ambulatory Visit (HOSPITAL_COMMUNITY): Payer: Self-pay

## 2015-05-18 DIAGNOSIS — J9811 Atelectasis: Secondary | ICD-10-CM | POA: Diagnosis not present

## 2015-05-18 DIAGNOSIS — R319 Hematuria, unspecified: Secondary | ICD-10-CM | POA: Diagnosis not present

## 2015-05-18 LAB — HEMOGLOBIN A1C
HEMOGLOBIN A1C: 6.1 % — AB (ref 4.8–5.6)
MEAN PLASMA GLUCOSE: 128 mg/dL

## 2015-05-19 LAB — BASIC METABOLIC PANEL
Anion gap: 8 (ref 5–15)
BUN: 15 mg/dL (ref 6–20)
CALCIUM: 8.8 mg/dL — AB (ref 8.9–10.3)
CO2: 28 mmol/L (ref 22–32)
CREATININE: 0.49 mg/dL (ref 0.44–1.00)
Chloride: 101 mmol/L (ref 101–111)
Glucose, Bld: 75 mg/dL (ref 65–99)
Potassium: 3.8 mmol/L (ref 3.5–5.1)
SODIUM: 137 mmol/L (ref 135–145)

## 2015-05-19 LAB — CBC
HCT: 24.8 % — ABNORMAL LOW (ref 36.0–46.0)
HEMOGLOBIN: 7.9 g/dL — AB (ref 12.0–15.0)
MCH: 32 pg (ref 26.0–34.0)
MCHC: 31.9 g/dL (ref 30.0–36.0)
MCV: 100.4 fL — ABNORMAL HIGH (ref 78.0–100.0)
PLATELETS: 271 10*3/uL (ref 150–400)
RBC: 2.47 MIL/uL — ABNORMAL LOW (ref 3.87–5.11)
RDW: 17.1 % — AB (ref 11.5–15.5)
WBC: 7.4 10*3/uL (ref 4.0–10.5)

## 2015-05-23 LAB — URINE MICROSCOPIC-ADD ON

## 2015-05-23 LAB — URINALYSIS, ROUTINE W REFLEX MICROSCOPIC
Bilirubin Urine: NEGATIVE
GLUCOSE, UA: NEGATIVE mg/dL
Ketones, ur: NEGATIVE mg/dL
Nitrite: NEGATIVE
PH: 8.5 — AB (ref 5.0–8.0)
Protein, ur: NEGATIVE mg/dL
SPECIFIC GRAVITY, URINE: 1.006 (ref 1.005–1.030)
Urobilinogen, UA: 0.2 mg/dL (ref 0.0–1.0)

## 2015-05-25 LAB — URINE CULTURE

## 2015-05-26 LAB — CBC
HEMATOCRIT: 26.3 % — AB (ref 36.0–46.0)
HEMOGLOBIN: 8.5 g/dL — AB (ref 12.0–15.0)
MCH: 31.5 pg (ref 26.0–34.0)
MCHC: 32.3 g/dL (ref 30.0–36.0)
MCV: 97.4 fL (ref 78.0–100.0)
Platelets: 308 10*3/uL (ref 150–400)
RBC: 2.7 MIL/uL — AB (ref 3.87–5.11)
RDW: 15.4 % (ref 11.5–15.5)
WBC: 7.7 10*3/uL (ref 4.0–10.5)

## 2015-05-26 LAB — BASIC METABOLIC PANEL
ANION GAP: 6 (ref 5–15)
BUN: 13 mg/dL (ref 6–20)
CHLORIDE: 97 mmol/L — AB (ref 101–111)
CO2: 31 mmol/L (ref 22–32)
CREATININE: 0.57 mg/dL (ref 0.44–1.00)
Calcium: 8.7 mg/dL — ABNORMAL LOW (ref 8.9–10.3)
GFR calc non Af Amer: >60 mL/min (ref >60)
Glucose, Bld: 81 mg/dL (ref 65–99)
POTASSIUM: 3.4 mmol/L — AB (ref 3.5–5.1)
SODIUM: 134 mmol/L — AB (ref 135–145)

## 2015-05-27 LAB — POTASSIUM: Potassium: 4.2 mmol/L (ref 3.5–5.1)

## 2015-06-01 DIAGNOSIS — R1310 Dysphagia, unspecified: Secondary | ICD-10-CM | POA: Diagnosis not present

## 2015-06-01 DIAGNOSIS — J9620 Acute and chronic respiratory failure, unspecified whether with hypoxia or hypercapnia: Secondary | ICD-10-CM | POA: Diagnosis not present

## 2015-06-01 DIAGNOSIS — E43 Unspecified severe protein-calorie malnutrition: Secondary | ICD-10-CM | POA: Diagnosis not present

## 2015-06-01 DIAGNOSIS — Z9889 Other specified postprocedural states: Secondary | ICD-10-CM | POA: Diagnosis not present

## 2015-06-01 DIAGNOSIS — J449 Chronic obstructive pulmonary disease, unspecified: Secondary | ICD-10-CM | POA: Diagnosis not present

## 2015-06-01 DIAGNOSIS — E46 Unspecified protein-calorie malnutrition: Secondary | ICD-10-CM | POA: Diagnosis not present

## 2015-06-01 DIAGNOSIS — D649 Anemia, unspecified: Secondary | ICD-10-CM | POA: Diagnosis not present

## 2015-06-01 DIAGNOSIS — R262 Difficulty in walking, not elsewhere classified: Secondary | ICD-10-CM | POA: Diagnosis not present

## 2015-06-01 DIAGNOSIS — R531 Weakness: Secondary | ICD-10-CM | POA: Diagnosis not present

## 2015-06-01 DIAGNOSIS — J9600 Acute respiratory failure, unspecified whether with hypoxia or hypercapnia: Secondary | ICD-10-CM | POA: Diagnosis not present

## 2015-06-01 DIAGNOSIS — I739 Peripheral vascular disease, unspecified: Secondary | ICD-10-CM | POA: Diagnosis not present

## 2015-06-01 LAB — CBC
HCT: 30.6 % — ABNORMAL LOW (ref 36.0–46.0)
HEMOGLOBIN: 9.6 g/dL — AB (ref 12.0–15.0)
MCH: 30.5 pg (ref 26.0–34.0)
MCHC: 31.4 g/dL (ref 30.0–36.0)
MCV: 97.1 fL (ref 78.0–100.0)
Platelets: 264 10*3/uL (ref 150–400)
RBC: 3.15 MIL/uL — AB (ref 3.87–5.11)
RDW: 15.7 % — ABNORMAL HIGH (ref 11.5–15.5)
WBC: 9 10*3/uL (ref 4.0–10.5)

## 2015-06-01 LAB — BASIC METABOLIC PANEL
Anion gap: 9 (ref 5–15)
BUN: 19 mg/dL (ref 6–20)
CHLORIDE: 98 mmol/L — AB (ref 101–111)
CO2: 28 mmol/L (ref 22–32)
Calcium: 8.6 mg/dL — ABNORMAL LOW (ref 8.9–10.3)
Creatinine, Ser: 0.67 mg/dL (ref 0.44–1.00)
GFR calc non Af Amer: >60 mL/min (ref >60)
Glucose, Bld: 101 mg/dL — ABNORMAL HIGH (ref 65–99)
POTASSIUM: 3.1 mmol/L — AB (ref 3.5–5.1)
SODIUM: 135 mmol/L (ref 135–145)

## 2015-06-04 DIAGNOSIS — J9620 Acute and chronic respiratory failure, unspecified whether with hypoxia or hypercapnia: Secondary | ICD-10-CM | POA: Diagnosis not present

## 2015-06-04 DIAGNOSIS — D649 Anemia, unspecified: Secondary | ICD-10-CM | POA: Diagnosis not present

## 2015-06-04 DIAGNOSIS — I739 Peripheral vascular disease, unspecified: Secondary | ICD-10-CM | POA: Diagnosis not present

## 2015-06-04 DIAGNOSIS — R262 Difficulty in walking, not elsewhere classified: Secondary | ICD-10-CM | POA: Diagnosis not present

## 2015-06-14 DIAGNOSIS — R531 Weakness: Secondary | ICD-10-CM | POA: Diagnosis not present

## 2015-06-14 DIAGNOSIS — T82868D Thrombosis of vascular prosthetic devices, implants and grafts, subsequent encounter: Secondary | ICD-10-CM | POA: Diagnosis not present

## 2015-06-14 DIAGNOSIS — J9690 Respiratory failure, unspecified, unspecified whether with hypoxia or hypercapnia: Secondary | ICD-10-CM | POA: Diagnosis not present

## 2015-06-14 DIAGNOSIS — F329 Major depressive disorder, single episode, unspecified: Secondary | ICD-10-CM | POA: Diagnosis not present

## 2015-06-14 DIAGNOSIS — I739 Peripheral vascular disease, unspecified: Secondary | ICD-10-CM | POA: Diagnosis not present

## 2015-06-14 DIAGNOSIS — J449 Chronic obstructive pulmonary disease, unspecified: Secondary | ICD-10-CM | POA: Diagnosis not present

## 2015-06-15 DIAGNOSIS — T82868D Thrombosis of vascular prosthetic devices, implants and grafts, subsequent encounter: Secondary | ICD-10-CM | POA: Diagnosis not present

## 2015-06-15 DIAGNOSIS — R531 Weakness: Secondary | ICD-10-CM | POA: Diagnosis not present

## 2015-06-15 DIAGNOSIS — J449 Chronic obstructive pulmonary disease, unspecified: Secondary | ICD-10-CM | POA: Diagnosis not present

## 2015-06-15 DIAGNOSIS — J9690 Respiratory failure, unspecified, unspecified whether with hypoxia or hypercapnia: Secondary | ICD-10-CM | POA: Diagnosis not present

## 2015-06-15 DIAGNOSIS — F329 Major depressive disorder, single episode, unspecified: Secondary | ICD-10-CM | POA: Diagnosis not present

## 2015-06-15 DIAGNOSIS — I739 Peripheral vascular disease, unspecified: Secondary | ICD-10-CM | POA: Diagnosis not present

## 2015-06-18 DIAGNOSIS — J9690 Respiratory failure, unspecified, unspecified whether with hypoxia or hypercapnia: Secondary | ICD-10-CM | POA: Diagnosis not present

## 2015-06-18 DIAGNOSIS — R531 Weakness: Secondary | ICD-10-CM | POA: Diagnosis not present

## 2015-06-18 DIAGNOSIS — T82868D Thrombosis of vascular prosthetic devices, implants and grafts, subsequent encounter: Secondary | ICD-10-CM | POA: Diagnosis not present

## 2015-06-18 DIAGNOSIS — F329 Major depressive disorder, single episode, unspecified: Secondary | ICD-10-CM | POA: Diagnosis not present

## 2015-06-18 DIAGNOSIS — I739 Peripheral vascular disease, unspecified: Secondary | ICD-10-CM | POA: Diagnosis not present

## 2015-06-18 DIAGNOSIS — J449 Chronic obstructive pulmonary disease, unspecified: Secondary | ICD-10-CM | POA: Diagnosis not present

## 2015-06-19 DIAGNOSIS — R531 Weakness: Secondary | ICD-10-CM | POA: Diagnosis not present

## 2015-06-19 DIAGNOSIS — F329 Major depressive disorder, single episode, unspecified: Secondary | ICD-10-CM | POA: Diagnosis not present

## 2015-06-19 DIAGNOSIS — I739 Peripheral vascular disease, unspecified: Secondary | ICD-10-CM | POA: Diagnosis not present

## 2015-06-19 DIAGNOSIS — J9690 Respiratory failure, unspecified, unspecified whether with hypoxia or hypercapnia: Secondary | ICD-10-CM | POA: Diagnosis not present

## 2015-06-19 DIAGNOSIS — J449 Chronic obstructive pulmonary disease, unspecified: Secondary | ICD-10-CM | POA: Diagnosis not present

## 2015-06-19 DIAGNOSIS — T82868D Thrombosis of vascular prosthetic devices, implants and grafts, subsequent encounter: Secondary | ICD-10-CM | POA: Diagnosis not present

## 2015-06-20 DIAGNOSIS — J449 Chronic obstructive pulmonary disease, unspecified: Secondary | ICD-10-CM | POA: Diagnosis not present

## 2015-06-20 DIAGNOSIS — T82868D Thrombosis of vascular prosthetic devices, implants and grafts, subsequent encounter: Secondary | ICD-10-CM | POA: Diagnosis not present

## 2015-06-20 DIAGNOSIS — F329 Major depressive disorder, single episode, unspecified: Secondary | ICD-10-CM | POA: Diagnosis not present

## 2015-06-20 DIAGNOSIS — J9690 Respiratory failure, unspecified, unspecified whether with hypoxia or hypercapnia: Secondary | ICD-10-CM | POA: Diagnosis not present

## 2015-06-20 DIAGNOSIS — R531 Weakness: Secondary | ICD-10-CM | POA: Diagnosis not present

## 2015-06-20 DIAGNOSIS — I739 Peripheral vascular disease, unspecified: Secondary | ICD-10-CM | POA: Diagnosis not present

## 2015-06-21 DIAGNOSIS — T82868D Thrombosis of vascular prosthetic devices, implants and grafts, subsequent encounter: Secondary | ICD-10-CM | POA: Diagnosis not present

## 2015-06-21 DIAGNOSIS — F329 Major depressive disorder, single episode, unspecified: Secondary | ICD-10-CM | POA: Diagnosis not present

## 2015-06-21 DIAGNOSIS — J9690 Respiratory failure, unspecified, unspecified whether with hypoxia or hypercapnia: Secondary | ICD-10-CM | POA: Diagnosis not present

## 2015-06-21 DIAGNOSIS — R531 Weakness: Secondary | ICD-10-CM | POA: Diagnosis not present

## 2015-06-21 DIAGNOSIS — J449 Chronic obstructive pulmonary disease, unspecified: Secondary | ICD-10-CM | POA: Diagnosis not present

## 2015-06-21 DIAGNOSIS — I739 Peripheral vascular disease, unspecified: Secondary | ICD-10-CM | POA: Diagnosis not present

## 2015-06-22 DIAGNOSIS — F329 Major depressive disorder, single episode, unspecified: Secondary | ICD-10-CM | POA: Diagnosis not present

## 2015-06-22 DIAGNOSIS — J449 Chronic obstructive pulmonary disease, unspecified: Secondary | ICD-10-CM | POA: Diagnosis not present

## 2015-06-22 DIAGNOSIS — J9690 Respiratory failure, unspecified, unspecified whether with hypoxia or hypercapnia: Secondary | ICD-10-CM | POA: Diagnosis not present

## 2015-06-22 DIAGNOSIS — T82868D Thrombosis of vascular prosthetic devices, implants and grafts, subsequent encounter: Secondary | ICD-10-CM | POA: Diagnosis not present

## 2015-06-22 DIAGNOSIS — R531 Weakness: Secondary | ICD-10-CM | POA: Diagnosis not present

## 2015-06-22 DIAGNOSIS — I739 Peripheral vascular disease, unspecified: Secondary | ICD-10-CM | POA: Diagnosis not present

## 2015-06-25 DIAGNOSIS — R531 Weakness: Secondary | ICD-10-CM | POA: Diagnosis not present

## 2015-06-25 DIAGNOSIS — F329 Major depressive disorder, single episode, unspecified: Secondary | ICD-10-CM | POA: Diagnosis not present

## 2015-06-25 DIAGNOSIS — I739 Peripheral vascular disease, unspecified: Secondary | ICD-10-CM | POA: Diagnosis not present

## 2015-06-25 DIAGNOSIS — J449 Chronic obstructive pulmonary disease, unspecified: Secondary | ICD-10-CM | POA: Diagnosis not present

## 2015-06-25 DIAGNOSIS — T82868D Thrombosis of vascular prosthetic devices, implants and grafts, subsequent encounter: Secondary | ICD-10-CM | POA: Diagnosis not present

## 2015-06-25 DIAGNOSIS — J9690 Respiratory failure, unspecified, unspecified whether with hypoxia or hypercapnia: Secondary | ICD-10-CM | POA: Diagnosis not present

## 2015-06-26 DIAGNOSIS — R531 Weakness: Secondary | ICD-10-CM | POA: Diagnosis not present

## 2015-06-26 DIAGNOSIS — T82868D Thrombosis of vascular prosthetic devices, implants and grafts, subsequent encounter: Secondary | ICD-10-CM | POA: Diagnosis not present

## 2015-06-26 DIAGNOSIS — J9690 Respiratory failure, unspecified, unspecified whether with hypoxia or hypercapnia: Secondary | ICD-10-CM | POA: Diagnosis not present

## 2015-06-26 DIAGNOSIS — F329 Major depressive disorder, single episode, unspecified: Secondary | ICD-10-CM | POA: Diagnosis not present

## 2015-06-26 DIAGNOSIS — I739 Peripheral vascular disease, unspecified: Secondary | ICD-10-CM | POA: Diagnosis not present

## 2015-06-26 DIAGNOSIS — J449 Chronic obstructive pulmonary disease, unspecified: Secondary | ICD-10-CM | POA: Diagnosis not present

## 2015-06-27 DIAGNOSIS — T82868D Thrombosis of vascular prosthetic devices, implants and grafts, subsequent encounter: Secondary | ICD-10-CM | POA: Diagnosis not present

## 2015-06-27 DIAGNOSIS — R531 Weakness: Secondary | ICD-10-CM | POA: Diagnosis not present

## 2015-06-27 DIAGNOSIS — F329 Major depressive disorder, single episode, unspecified: Secondary | ICD-10-CM | POA: Diagnosis not present

## 2015-06-27 DIAGNOSIS — J449 Chronic obstructive pulmonary disease, unspecified: Secondary | ICD-10-CM | POA: Diagnosis not present

## 2015-06-27 DIAGNOSIS — J9690 Respiratory failure, unspecified, unspecified whether with hypoxia or hypercapnia: Secondary | ICD-10-CM | POA: Diagnosis not present

## 2015-06-27 DIAGNOSIS — I739 Peripheral vascular disease, unspecified: Secondary | ICD-10-CM | POA: Diagnosis not present

## 2015-06-28 DIAGNOSIS — J449 Chronic obstructive pulmonary disease, unspecified: Secondary | ICD-10-CM | POA: Diagnosis not present

## 2015-06-28 DIAGNOSIS — I110 Hypertensive heart disease with heart failure: Secondary | ICD-10-CM | POA: Diagnosis not present

## 2015-06-28 DIAGNOSIS — T82868D Thrombosis of vascular prosthetic devices, implants and grafts, subsequent encounter: Secondary | ICD-10-CM | POA: Diagnosis not present

## 2015-06-28 DIAGNOSIS — R531 Weakness: Secondary | ICD-10-CM | POA: Diagnosis not present

## 2015-06-28 DIAGNOSIS — I5032 Chronic diastolic (congestive) heart failure: Secondary | ICD-10-CM | POA: Diagnosis not present

## 2015-06-28 DIAGNOSIS — G629 Polyneuropathy, unspecified: Secondary | ICD-10-CM | POA: Diagnosis not present

## 2015-06-28 DIAGNOSIS — J9690 Respiratory failure, unspecified, unspecified whether with hypoxia or hypercapnia: Secondary | ICD-10-CM | POA: Diagnosis not present

## 2015-06-28 DIAGNOSIS — F329 Major depressive disorder, single episode, unspecified: Secondary | ICD-10-CM | POA: Diagnosis not present

## 2015-06-28 DIAGNOSIS — Z79899 Other long term (current) drug therapy: Secondary | ICD-10-CM | POA: Diagnosis not present

## 2015-06-28 DIAGNOSIS — I739 Peripheral vascular disease, unspecified: Secondary | ICD-10-CM | POA: Diagnosis not present

## 2015-06-29 DIAGNOSIS — J449 Chronic obstructive pulmonary disease, unspecified: Secondary | ICD-10-CM | POA: Diagnosis not present

## 2015-06-29 DIAGNOSIS — I739 Peripheral vascular disease, unspecified: Secondary | ICD-10-CM | POA: Diagnosis not present

## 2015-06-29 DIAGNOSIS — J9690 Respiratory failure, unspecified, unspecified whether with hypoxia or hypercapnia: Secondary | ICD-10-CM | POA: Diagnosis not present

## 2015-06-29 DIAGNOSIS — F329 Major depressive disorder, single episode, unspecified: Secondary | ICD-10-CM | POA: Diagnosis not present

## 2015-06-29 DIAGNOSIS — R531 Weakness: Secondary | ICD-10-CM | POA: Diagnosis not present

## 2015-06-29 DIAGNOSIS — T82868D Thrombosis of vascular prosthetic devices, implants and grafts, subsequent encounter: Secondary | ICD-10-CM | POA: Diagnosis not present

## 2015-07-02 DIAGNOSIS — J449 Chronic obstructive pulmonary disease, unspecified: Secondary | ICD-10-CM | POA: Diagnosis not present

## 2015-07-02 DIAGNOSIS — I739 Peripheral vascular disease, unspecified: Secondary | ICD-10-CM | POA: Diagnosis not present

## 2015-07-02 DIAGNOSIS — R531 Weakness: Secondary | ICD-10-CM | POA: Diagnosis not present

## 2015-07-02 DIAGNOSIS — J9690 Respiratory failure, unspecified, unspecified whether with hypoxia or hypercapnia: Secondary | ICD-10-CM | POA: Diagnosis not present

## 2015-07-02 DIAGNOSIS — T82868D Thrombosis of vascular prosthetic devices, implants and grafts, subsequent encounter: Secondary | ICD-10-CM | POA: Diagnosis not present

## 2015-07-02 DIAGNOSIS — F329 Major depressive disorder, single episode, unspecified: Secondary | ICD-10-CM | POA: Diagnosis not present

## 2015-07-04 DIAGNOSIS — I739 Peripheral vascular disease, unspecified: Secondary | ICD-10-CM | POA: Diagnosis not present

## 2015-07-04 DIAGNOSIS — T82868D Thrombosis of vascular prosthetic devices, implants and grafts, subsequent encounter: Secondary | ICD-10-CM | POA: Diagnosis not present

## 2015-07-04 DIAGNOSIS — F329 Major depressive disorder, single episode, unspecified: Secondary | ICD-10-CM | POA: Diagnosis not present

## 2015-07-04 DIAGNOSIS — J449 Chronic obstructive pulmonary disease, unspecified: Secondary | ICD-10-CM | POA: Diagnosis not present

## 2015-07-04 DIAGNOSIS — J9690 Respiratory failure, unspecified, unspecified whether with hypoxia or hypercapnia: Secondary | ICD-10-CM | POA: Diagnosis not present

## 2015-07-04 DIAGNOSIS — R531 Weakness: Secondary | ICD-10-CM | POA: Diagnosis not present

## 2015-07-06 DIAGNOSIS — J449 Chronic obstructive pulmonary disease, unspecified: Secondary | ICD-10-CM | POA: Diagnosis not present

## 2015-07-06 DIAGNOSIS — J9690 Respiratory failure, unspecified, unspecified whether with hypoxia or hypercapnia: Secondary | ICD-10-CM | POA: Diagnosis not present

## 2015-07-06 DIAGNOSIS — R531 Weakness: Secondary | ICD-10-CM | POA: Diagnosis not present

## 2015-07-06 DIAGNOSIS — I739 Peripheral vascular disease, unspecified: Secondary | ICD-10-CM | POA: Diagnosis not present

## 2015-07-06 DIAGNOSIS — T82868D Thrombosis of vascular prosthetic devices, implants and grafts, subsequent encounter: Secondary | ICD-10-CM | POA: Diagnosis not present

## 2015-07-06 DIAGNOSIS — F329 Major depressive disorder, single episode, unspecified: Secondary | ICD-10-CM | POA: Diagnosis not present

## 2015-07-09 DIAGNOSIS — F329 Major depressive disorder, single episode, unspecified: Secondary | ICD-10-CM | POA: Diagnosis not present

## 2015-07-09 DIAGNOSIS — J449 Chronic obstructive pulmonary disease, unspecified: Secondary | ICD-10-CM | POA: Diagnosis not present

## 2015-07-09 DIAGNOSIS — J9690 Respiratory failure, unspecified, unspecified whether with hypoxia or hypercapnia: Secondary | ICD-10-CM | POA: Diagnosis not present

## 2015-07-09 DIAGNOSIS — R531 Weakness: Secondary | ICD-10-CM | POA: Diagnosis not present

## 2015-07-09 DIAGNOSIS — I739 Peripheral vascular disease, unspecified: Secondary | ICD-10-CM | POA: Diagnosis not present

## 2015-07-09 DIAGNOSIS — T82868D Thrombosis of vascular prosthetic devices, implants and grafts, subsequent encounter: Secondary | ICD-10-CM | POA: Diagnosis not present

## 2015-07-11 DIAGNOSIS — I70203 Unspecified atherosclerosis of native arteries of extremities, bilateral legs: Secondary | ICD-10-CM | POA: Diagnosis not present

## 2015-07-11 DIAGNOSIS — Z95820 Peripheral vascular angioplasty status with implants and grafts: Secondary | ICD-10-CM | POA: Diagnosis not present

## 2015-07-12 DIAGNOSIS — I739 Peripheral vascular disease, unspecified: Secondary | ICD-10-CM | POA: Diagnosis not present

## 2015-07-12 DIAGNOSIS — F329 Major depressive disorder, single episode, unspecified: Secondary | ICD-10-CM | POA: Diagnosis not present

## 2015-07-12 DIAGNOSIS — J9690 Respiratory failure, unspecified, unspecified whether with hypoxia or hypercapnia: Secondary | ICD-10-CM | POA: Diagnosis not present

## 2015-07-12 DIAGNOSIS — T82868D Thrombosis of vascular prosthetic devices, implants and grafts, subsequent encounter: Secondary | ICD-10-CM | POA: Diagnosis not present

## 2015-07-12 DIAGNOSIS — J449 Chronic obstructive pulmonary disease, unspecified: Secondary | ICD-10-CM | POA: Diagnosis not present

## 2015-07-12 DIAGNOSIS — R531 Weakness: Secondary | ICD-10-CM | POA: Diagnosis not present

## 2015-07-13 DIAGNOSIS — J9690 Respiratory failure, unspecified, unspecified whether with hypoxia or hypercapnia: Secondary | ICD-10-CM | POA: Diagnosis not present

## 2015-07-13 DIAGNOSIS — J449 Chronic obstructive pulmonary disease, unspecified: Secondary | ICD-10-CM | POA: Diagnosis not present

## 2015-07-13 DIAGNOSIS — R531 Weakness: Secondary | ICD-10-CM | POA: Diagnosis not present

## 2015-07-13 DIAGNOSIS — I739 Peripheral vascular disease, unspecified: Secondary | ICD-10-CM | POA: Diagnosis not present

## 2015-07-13 DIAGNOSIS — F329 Major depressive disorder, single episode, unspecified: Secondary | ICD-10-CM | POA: Diagnosis not present

## 2015-07-13 DIAGNOSIS — T82868D Thrombosis of vascular prosthetic devices, implants and grafts, subsequent encounter: Secondary | ICD-10-CM | POA: Diagnosis not present

## 2015-07-17 DIAGNOSIS — R531 Weakness: Secondary | ICD-10-CM | POA: Diagnosis not present

## 2015-07-17 DIAGNOSIS — I739 Peripheral vascular disease, unspecified: Secondary | ICD-10-CM | POA: Diagnosis not present

## 2015-07-17 DIAGNOSIS — J9690 Respiratory failure, unspecified, unspecified whether with hypoxia or hypercapnia: Secondary | ICD-10-CM | POA: Diagnosis not present

## 2015-07-17 DIAGNOSIS — T82868D Thrombosis of vascular prosthetic devices, implants and grafts, subsequent encounter: Secondary | ICD-10-CM | POA: Diagnosis not present

## 2015-07-17 DIAGNOSIS — J449 Chronic obstructive pulmonary disease, unspecified: Secondary | ICD-10-CM | POA: Diagnosis not present

## 2015-07-17 DIAGNOSIS — F329 Major depressive disorder, single episode, unspecified: Secondary | ICD-10-CM | POA: Diagnosis not present

## 2015-07-24 DIAGNOSIS — R531 Weakness: Secondary | ICD-10-CM | POA: Diagnosis not present

## 2015-07-24 DIAGNOSIS — I739 Peripheral vascular disease, unspecified: Secondary | ICD-10-CM | POA: Diagnosis not present

## 2015-07-24 DIAGNOSIS — J9690 Respiratory failure, unspecified, unspecified whether with hypoxia or hypercapnia: Secondary | ICD-10-CM | POA: Diagnosis not present

## 2015-07-24 DIAGNOSIS — J449 Chronic obstructive pulmonary disease, unspecified: Secondary | ICD-10-CM | POA: Diagnosis not present

## 2015-07-24 DIAGNOSIS — F329 Major depressive disorder, single episode, unspecified: Secondary | ICD-10-CM | POA: Diagnosis not present

## 2015-07-24 DIAGNOSIS — T82868D Thrombosis of vascular prosthetic devices, implants and grafts, subsequent encounter: Secondary | ICD-10-CM | POA: Diagnosis not present

## 2015-07-31 DIAGNOSIS — I739 Peripheral vascular disease, unspecified: Secondary | ICD-10-CM | POA: Diagnosis not present

## 2015-07-31 DIAGNOSIS — F329 Major depressive disorder, single episode, unspecified: Secondary | ICD-10-CM | POA: Diagnosis not present

## 2015-07-31 DIAGNOSIS — J449 Chronic obstructive pulmonary disease, unspecified: Secondary | ICD-10-CM | POA: Diagnosis not present

## 2015-07-31 DIAGNOSIS — J9690 Respiratory failure, unspecified, unspecified whether with hypoxia or hypercapnia: Secondary | ICD-10-CM | POA: Diagnosis not present

## 2015-07-31 DIAGNOSIS — T82868D Thrombosis of vascular prosthetic devices, implants and grafts, subsequent encounter: Secondary | ICD-10-CM | POA: Diagnosis not present

## 2015-07-31 DIAGNOSIS — R531 Weakness: Secondary | ICD-10-CM | POA: Diagnosis not present

## 2015-08-01 DIAGNOSIS — M5442 Lumbago with sciatica, left side: Secondary | ICD-10-CM | POA: Diagnosis not present

## 2015-08-01 DIAGNOSIS — I739 Peripheral vascular disease, unspecified: Secondary | ICD-10-CM | POA: Diagnosis not present

## 2015-08-01 DIAGNOSIS — F17201 Nicotine dependence, unspecified, in remission: Secondary | ICD-10-CM | POA: Diagnosis not present

## 2015-08-01 DIAGNOSIS — I110 Hypertensive heart disease with heart failure: Secondary | ICD-10-CM | POA: Diagnosis not present

## 2015-08-03 DIAGNOSIS — M545 Low back pain: Secondary | ICD-10-CM | POA: Diagnosis not present

## 2015-08-03 DIAGNOSIS — M5442 Lumbago with sciatica, left side: Secondary | ICD-10-CM | POA: Diagnosis not present

## 2015-08-09 DIAGNOSIS — T82868D Thrombosis of vascular prosthetic devices, implants and grafts, subsequent encounter: Secondary | ICD-10-CM | POA: Diagnosis not present

## 2015-08-09 DIAGNOSIS — J449 Chronic obstructive pulmonary disease, unspecified: Secondary | ICD-10-CM | POA: Diagnosis not present

## 2015-08-09 DIAGNOSIS — R531 Weakness: Secondary | ICD-10-CM | POA: Diagnosis not present

## 2015-08-09 DIAGNOSIS — F329 Major depressive disorder, single episode, unspecified: Secondary | ICD-10-CM | POA: Diagnosis not present

## 2015-08-09 DIAGNOSIS — J9690 Respiratory failure, unspecified, unspecified whether with hypoxia or hypercapnia: Secondary | ICD-10-CM | POA: Diagnosis not present

## 2015-08-09 DIAGNOSIS — I739 Peripheral vascular disease, unspecified: Secondary | ICD-10-CM | POA: Diagnosis not present

## 2015-08-10 DIAGNOSIS — T82868D Thrombosis of vascular prosthetic devices, implants and grafts, subsequent encounter: Secondary | ICD-10-CM | POA: Diagnosis not present

## 2015-08-10 DIAGNOSIS — F329 Major depressive disorder, single episode, unspecified: Secondary | ICD-10-CM | POA: Diagnosis not present

## 2015-08-10 DIAGNOSIS — J449 Chronic obstructive pulmonary disease, unspecified: Secondary | ICD-10-CM | POA: Diagnosis not present

## 2015-08-10 DIAGNOSIS — J9690 Respiratory failure, unspecified, unspecified whether with hypoxia or hypercapnia: Secondary | ICD-10-CM | POA: Diagnosis not present

## 2015-08-10 DIAGNOSIS — R300 Dysuria: Secondary | ICD-10-CM | POA: Diagnosis not present

## 2015-08-10 DIAGNOSIS — I739 Peripheral vascular disease, unspecified: Secondary | ICD-10-CM | POA: Diagnosis not present

## 2015-08-10 DIAGNOSIS — R531 Weakness: Secondary | ICD-10-CM | POA: Diagnosis not present

## 2015-08-13 DIAGNOSIS — J439 Emphysema, unspecified: Secondary | ICD-10-CM | POA: Diagnosis not present

## 2015-08-13 DIAGNOSIS — R911 Solitary pulmonary nodule: Secondary | ICD-10-CM | POA: Diagnosis not present

## 2015-08-13 DIAGNOSIS — J449 Chronic obstructive pulmonary disease, unspecified: Secondary | ICD-10-CM | POA: Diagnosis not present

## 2015-08-13 DIAGNOSIS — I70209 Unspecified atherosclerosis of native arteries of extremities, unspecified extremity: Secondary | ICD-10-CM | POA: Diagnosis not present

## 2015-08-13 DIAGNOSIS — R918 Other nonspecific abnormal finding of lung field: Secondary | ICD-10-CM | POA: Diagnosis not present

## 2015-08-13 DIAGNOSIS — M79604 Pain in right leg: Secondary | ICD-10-CM | POA: Diagnosis not present

## 2015-08-13 DIAGNOSIS — J9690 Respiratory failure, unspecified, unspecified whether with hypoxia or hypercapnia: Secondary | ICD-10-CM | POA: Diagnosis not present

## 2015-08-13 DIAGNOSIS — I10 Essential (primary) hypertension: Secondary | ICD-10-CM | POA: Diagnosis not present

## 2015-08-13 DIAGNOSIS — M79605 Pain in left leg: Secondary | ICD-10-CM | POA: Diagnosis not present

## 2015-08-16 DIAGNOSIS — J449 Chronic obstructive pulmonary disease, unspecified: Secondary | ICD-10-CM | POA: Diagnosis not present

## 2015-08-16 DIAGNOSIS — M79605 Pain in left leg: Secondary | ICD-10-CM | POA: Diagnosis not present

## 2015-08-16 DIAGNOSIS — R5383 Other fatigue: Secondary | ICD-10-CM | POA: Diagnosis not present

## 2015-08-16 DIAGNOSIS — M79604 Pain in right leg: Secondary | ICD-10-CM | POA: Diagnosis not present

## 2015-08-16 DIAGNOSIS — R911 Solitary pulmonary nodule: Secondary | ICD-10-CM | POA: Diagnosis not present

## 2015-08-16 DIAGNOSIS — J9690 Respiratory failure, unspecified, unspecified whether with hypoxia or hypercapnia: Secondary | ICD-10-CM | POA: Diagnosis not present

## 2015-08-16 DIAGNOSIS — I70209 Unspecified atherosclerosis of native arteries of extremities, unspecified extremity: Secondary | ICD-10-CM | POA: Diagnosis not present

## 2015-08-16 DIAGNOSIS — I10 Essential (primary) hypertension: Secondary | ICD-10-CM | POA: Diagnosis not present

## 2015-08-21 DIAGNOSIS — E559 Vitamin D deficiency, unspecified: Secondary | ICD-10-CM | POA: Diagnosis not present

## 2015-08-22 DIAGNOSIS — I10 Essential (primary) hypertension: Secondary | ICD-10-CM | POA: Diagnosis not present

## 2015-08-22 DIAGNOSIS — I70209 Unspecified atherosclerosis of native arteries of extremities, unspecified extremity: Secondary | ICD-10-CM | POA: Diagnosis not present

## 2015-08-22 DIAGNOSIS — J9690 Respiratory failure, unspecified, unspecified whether with hypoxia or hypercapnia: Secondary | ICD-10-CM | POA: Diagnosis not present

## 2015-08-22 DIAGNOSIS — J449 Chronic obstructive pulmonary disease, unspecified: Secondary | ICD-10-CM | POA: Diagnosis not present

## 2015-08-22 DIAGNOSIS — M79605 Pain in left leg: Secondary | ICD-10-CM | POA: Diagnosis not present

## 2015-08-22 DIAGNOSIS — M79604 Pain in right leg: Secondary | ICD-10-CM | POA: Diagnosis not present

## 2015-08-24 DIAGNOSIS — M79604 Pain in right leg: Secondary | ICD-10-CM | POA: Diagnosis not present

## 2015-08-24 DIAGNOSIS — I70209 Unspecified atherosclerosis of native arteries of extremities, unspecified extremity: Secondary | ICD-10-CM | POA: Diagnosis not present

## 2015-08-24 DIAGNOSIS — J449 Chronic obstructive pulmonary disease, unspecified: Secondary | ICD-10-CM | POA: Diagnosis not present

## 2015-08-24 DIAGNOSIS — R300 Dysuria: Secondary | ICD-10-CM | POA: Diagnosis not present

## 2015-08-24 DIAGNOSIS — J9690 Respiratory failure, unspecified, unspecified whether with hypoxia or hypercapnia: Secondary | ICD-10-CM | POA: Diagnosis not present

## 2015-08-24 DIAGNOSIS — I10 Essential (primary) hypertension: Secondary | ICD-10-CM | POA: Diagnosis not present

## 2015-08-24 DIAGNOSIS — M79605 Pain in left leg: Secondary | ICD-10-CM | POA: Diagnosis not present

## 2015-08-29 DIAGNOSIS — R918 Other nonspecific abnormal finding of lung field: Secondary | ICD-10-CM | POA: Diagnosis not present

## 2015-08-29 DIAGNOSIS — R911 Solitary pulmonary nodule: Secondary | ICD-10-CM | POA: Diagnosis not present

## 2015-08-30 DIAGNOSIS — J449 Chronic obstructive pulmonary disease, unspecified: Secondary | ICD-10-CM | POA: Diagnosis not present

## 2015-08-30 DIAGNOSIS — J9690 Respiratory failure, unspecified, unspecified whether with hypoxia or hypercapnia: Secondary | ICD-10-CM | POA: Diagnosis not present

## 2015-08-30 DIAGNOSIS — M79605 Pain in left leg: Secondary | ICD-10-CM | POA: Diagnosis not present

## 2015-08-30 DIAGNOSIS — M79604 Pain in right leg: Secondary | ICD-10-CM | POA: Diagnosis not present

## 2015-08-30 DIAGNOSIS — I70209 Unspecified atherosclerosis of native arteries of extremities, unspecified extremity: Secondary | ICD-10-CM | POA: Diagnosis not present

## 2015-08-30 DIAGNOSIS — I10 Essential (primary) hypertension: Secondary | ICD-10-CM | POA: Diagnosis not present

## 2015-08-31 DIAGNOSIS — R5383 Other fatigue: Secondary | ICD-10-CM | POA: Diagnosis not present

## 2015-08-31 DIAGNOSIS — J449 Chronic obstructive pulmonary disease, unspecified: Secondary | ICD-10-CM | POA: Diagnosis not present

## 2015-08-31 DIAGNOSIS — R911 Solitary pulmonary nodule: Secondary | ICD-10-CM | POA: Diagnosis not present

## 2015-09-06 DIAGNOSIS — M79605 Pain in left leg: Secondary | ICD-10-CM | POA: Diagnosis not present

## 2015-09-06 DIAGNOSIS — I10 Essential (primary) hypertension: Secondary | ICD-10-CM | POA: Diagnosis not present

## 2015-09-06 DIAGNOSIS — M79604 Pain in right leg: Secondary | ICD-10-CM | POA: Diagnosis not present

## 2015-09-06 DIAGNOSIS — J449 Chronic obstructive pulmonary disease, unspecified: Secondary | ICD-10-CM | POA: Diagnosis not present

## 2015-09-06 DIAGNOSIS — I70209 Unspecified atherosclerosis of native arteries of extremities, unspecified extremity: Secondary | ICD-10-CM | POA: Diagnosis not present

## 2015-09-06 DIAGNOSIS — J9690 Respiratory failure, unspecified, unspecified whether with hypoxia or hypercapnia: Secondary | ICD-10-CM | POA: Diagnosis not present

## 2015-09-07 DIAGNOSIS — G629 Polyneuropathy, unspecified: Secondary | ICD-10-CM | POA: Diagnosis not present

## 2015-09-07 DIAGNOSIS — I110 Hypertensive heart disease with heart failure: Secondary | ICD-10-CM | POA: Diagnosis not present

## 2015-09-07 DIAGNOSIS — N3090 Cystitis, unspecified without hematuria: Secondary | ICD-10-CM | POA: Diagnosis not present

## 2015-09-07 DIAGNOSIS — J449 Chronic obstructive pulmonary disease, unspecified: Secondary | ICD-10-CM | POA: Diagnosis not present

## 2015-09-11 DIAGNOSIS — I10 Essential (primary) hypertension: Secondary | ICD-10-CM | POA: Diagnosis not present

## 2015-09-11 DIAGNOSIS — I70209 Unspecified atherosclerosis of native arteries of extremities, unspecified extremity: Secondary | ICD-10-CM | POA: Diagnosis not present

## 2015-09-11 DIAGNOSIS — M79605 Pain in left leg: Secondary | ICD-10-CM | POA: Diagnosis not present

## 2015-09-11 DIAGNOSIS — M79604 Pain in right leg: Secondary | ICD-10-CM | POA: Diagnosis not present

## 2015-09-11 DIAGNOSIS — J9690 Respiratory failure, unspecified, unspecified whether with hypoxia or hypercapnia: Secondary | ICD-10-CM | POA: Diagnosis not present

## 2015-09-11 DIAGNOSIS — J449 Chronic obstructive pulmonary disease, unspecified: Secondary | ICD-10-CM | POA: Diagnosis not present

## 2015-09-18 DIAGNOSIS — R5383 Other fatigue: Secondary | ICD-10-CM | POA: Diagnosis not present

## 2015-09-18 DIAGNOSIS — R911 Solitary pulmonary nodule: Secondary | ICD-10-CM | POA: Diagnosis not present

## 2015-09-18 DIAGNOSIS — J449 Chronic obstructive pulmonary disease, unspecified: Secondary | ICD-10-CM | POA: Diagnosis not present

## 2015-09-19 DIAGNOSIS — M79604 Pain in right leg: Secondary | ICD-10-CM | POA: Diagnosis not present

## 2015-09-19 DIAGNOSIS — I10 Essential (primary) hypertension: Secondary | ICD-10-CM | POA: Diagnosis not present

## 2015-09-19 DIAGNOSIS — J449 Chronic obstructive pulmonary disease, unspecified: Secondary | ICD-10-CM | POA: Diagnosis not present

## 2015-09-19 DIAGNOSIS — M79605 Pain in left leg: Secondary | ICD-10-CM | POA: Diagnosis not present

## 2015-09-19 DIAGNOSIS — J9690 Respiratory failure, unspecified, unspecified whether with hypoxia or hypercapnia: Secondary | ICD-10-CM | POA: Diagnosis not present

## 2015-09-19 DIAGNOSIS — I70209 Unspecified atherosclerosis of native arteries of extremities, unspecified extremity: Secondary | ICD-10-CM | POA: Diagnosis not present

## 2015-09-27 DIAGNOSIS — J9690 Respiratory failure, unspecified, unspecified whether with hypoxia or hypercapnia: Secondary | ICD-10-CM | POA: Diagnosis not present

## 2015-09-27 DIAGNOSIS — M79604 Pain in right leg: Secondary | ICD-10-CM | POA: Diagnosis not present

## 2015-09-27 DIAGNOSIS — M79605 Pain in left leg: Secondary | ICD-10-CM | POA: Diagnosis not present

## 2015-09-27 DIAGNOSIS — J449 Chronic obstructive pulmonary disease, unspecified: Secondary | ICD-10-CM | POA: Diagnosis not present

## 2015-09-27 DIAGNOSIS — I70209 Unspecified atherosclerosis of native arteries of extremities, unspecified extremity: Secondary | ICD-10-CM | POA: Diagnosis not present

## 2015-09-27 DIAGNOSIS — I10 Essential (primary) hypertension: Secondary | ICD-10-CM | POA: Diagnosis not present

## 2015-09-28 DIAGNOSIS — N3090 Cystitis, unspecified without hematuria: Secondary | ICD-10-CM | POA: Diagnosis not present

## 2015-09-28 DIAGNOSIS — N3091 Cystitis, unspecified with hematuria: Secondary | ICD-10-CM | POA: Diagnosis not present

## 2015-09-28 DIAGNOSIS — N3289 Other specified disorders of bladder: Secondary | ICD-10-CM | POA: Diagnosis not present

## 2015-09-28 DIAGNOSIS — R351 Nocturia: Secondary | ICD-10-CM | POA: Diagnosis not present

## 2015-10-04 DIAGNOSIS — J9690 Respiratory failure, unspecified, unspecified whether with hypoxia or hypercapnia: Secondary | ICD-10-CM | POA: Diagnosis not present

## 2015-10-04 DIAGNOSIS — I10 Essential (primary) hypertension: Secondary | ICD-10-CM | POA: Diagnosis not present

## 2015-10-04 DIAGNOSIS — J449 Chronic obstructive pulmonary disease, unspecified: Secondary | ICD-10-CM | POA: Diagnosis not present

## 2015-10-04 DIAGNOSIS — M79604 Pain in right leg: Secondary | ICD-10-CM | POA: Diagnosis not present

## 2015-10-04 DIAGNOSIS — I70209 Unspecified atherosclerosis of native arteries of extremities, unspecified extremity: Secondary | ICD-10-CM | POA: Diagnosis not present

## 2015-10-04 DIAGNOSIS — M79605 Pain in left leg: Secondary | ICD-10-CM | POA: Diagnosis not present

## 2015-10-09 DIAGNOSIS — J449 Chronic obstructive pulmonary disease, unspecified: Secondary | ICD-10-CM | POA: Diagnosis not present

## 2015-10-09 DIAGNOSIS — I10 Essential (primary) hypertension: Secondary | ICD-10-CM | POA: Diagnosis not present

## 2015-10-09 DIAGNOSIS — M79604 Pain in right leg: Secondary | ICD-10-CM | POA: Diagnosis not present

## 2015-10-09 DIAGNOSIS — J9690 Respiratory failure, unspecified, unspecified whether with hypoxia or hypercapnia: Secondary | ICD-10-CM | POA: Diagnosis not present

## 2015-10-09 DIAGNOSIS — M79605 Pain in left leg: Secondary | ICD-10-CM | POA: Diagnosis not present

## 2015-10-09 DIAGNOSIS — I70209 Unspecified atherosclerosis of native arteries of extremities, unspecified extremity: Secondary | ICD-10-CM | POA: Diagnosis not present

## 2015-10-09 DIAGNOSIS — R350 Frequency of micturition: Secondary | ICD-10-CM | POA: Diagnosis not present

## 2015-10-09 DIAGNOSIS — R109 Unspecified abdominal pain: Secondary | ICD-10-CM | POA: Diagnosis not present

## 2015-10-10 DIAGNOSIS — Z0000 Encounter for general adult medical examination without abnormal findings: Secondary | ICD-10-CM | POA: Diagnosis not present

## 2015-10-10 DIAGNOSIS — J449 Chronic obstructive pulmonary disease, unspecified: Secondary | ICD-10-CM | POA: Diagnosis not present

## 2015-10-10 DIAGNOSIS — I110 Hypertensive heart disease with heart failure: Secondary | ICD-10-CM | POA: Diagnosis not present

## 2015-10-10 DIAGNOSIS — R918 Other nonspecific abnormal finding of lung field: Secondary | ICD-10-CM | POA: Diagnosis not present

## 2015-10-10 DIAGNOSIS — G629 Polyneuropathy, unspecified: Secondary | ICD-10-CM | POA: Diagnosis not present

## 2015-10-12 DIAGNOSIS — M79605 Pain in left leg: Secondary | ICD-10-CM | POA: Diagnosis not present

## 2015-10-12 DIAGNOSIS — M79604 Pain in right leg: Secondary | ICD-10-CM | POA: Diagnosis not present

## 2015-10-12 DIAGNOSIS — I10 Essential (primary) hypertension: Secondary | ICD-10-CM | POA: Diagnosis not present

## 2015-10-12 DIAGNOSIS — J449 Chronic obstructive pulmonary disease, unspecified: Secondary | ICD-10-CM | POA: Diagnosis not present

## 2015-10-12 DIAGNOSIS — N390 Urinary tract infection, site not specified: Secondary | ICD-10-CM | POA: Diagnosis not present

## 2015-10-12 DIAGNOSIS — J9690 Respiratory failure, unspecified, unspecified whether with hypoxia or hypercapnia: Secondary | ICD-10-CM | POA: Diagnosis not present

## 2015-10-15 DIAGNOSIS — N3090 Cystitis, unspecified without hematuria: Secondary | ICD-10-CM | POA: Diagnosis not present

## 2015-10-15 DIAGNOSIS — R310 Gross hematuria: Secondary | ICD-10-CM | POA: Diagnosis not present

## 2015-10-19 DIAGNOSIS — J9690 Respiratory failure, unspecified, unspecified whether with hypoxia or hypercapnia: Secondary | ICD-10-CM | POA: Diagnosis not present

## 2015-10-19 DIAGNOSIS — I10 Essential (primary) hypertension: Secondary | ICD-10-CM | POA: Diagnosis not present

## 2015-10-19 DIAGNOSIS — M79605 Pain in left leg: Secondary | ICD-10-CM | POA: Diagnosis not present

## 2015-10-19 DIAGNOSIS — M79604 Pain in right leg: Secondary | ICD-10-CM | POA: Diagnosis not present

## 2015-10-19 DIAGNOSIS — J449 Chronic obstructive pulmonary disease, unspecified: Secondary | ICD-10-CM | POA: Diagnosis not present

## 2015-10-19 DIAGNOSIS — N390 Urinary tract infection, site not specified: Secondary | ICD-10-CM | POA: Diagnosis not present

## 2015-10-23 DIAGNOSIS — J9690 Respiratory failure, unspecified, unspecified whether with hypoxia or hypercapnia: Secondary | ICD-10-CM | POA: Diagnosis not present

## 2015-10-23 DIAGNOSIS — M79604 Pain in right leg: Secondary | ICD-10-CM | POA: Diagnosis not present

## 2015-10-23 DIAGNOSIS — M79605 Pain in left leg: Secondary | ICD-10-CM | POA: Diagnosis not present

## 2015-10-23 DIAGNOSIS — I10 Essential (primary) hypertension: Secondary | ICD-10-CM | POA: Diagnosis not present

## 2015-10-23 DIAGNOSIS — N390 Urinary tract infection, site not specified: Secondary | ICD-10-CM | POA: Diagnosis not present

## 2015-10-23 DIAGNOSIS — J449 Chronic obstructive pulmonary disease, unspecified: Secondary | ICD-10-CM | POA: Diagnosis not present

## 2015-10-25 DIAGNOSIS — E785 Hyperlipidemia, unspecified: Secondary | ICD-10-CM | POA: Diagnosis not present

## 2015-10-25 DIAGNOSIS — J449 Chronic obstructive pulmonary disease, unspecified: Secondary | ICD-10-CM | POA: Diagnosis not present

## 2015-10-25 DIAGNOSIS — Z87891 Personal history of nicotine dependence: Secondary | ICD-10-CM | POA: Diagnosis not present

## 2015-10-25 DIAGNOSIS — R310 Gross hematuria: Secondary | ICD-10-CM | POA: Diagnosis not present

## 2015-10-25 DIAGNOSIS — I110 Hypertensive heart disease with heart failure: Secondary | ICD-10-CM | POA: Diagnosis not present

## 2015-10-25 DIAGNOSIS — G4700 Insomnia, unspecified: Secondary | ICD-10-CM | POA: Diagnosis not present

## 2015-10-25 DIAGNOSIS — I5032 Chronic diastolic (congestive) heart failure: Secondary | ICD-10-CM | POA: Diagnosis not present

## 2015-10-25 DIAGNOSIS — N3289 Other specified disorders of bladder: Secondary | ICD-10-CM | POA: Diagnosis not present

## 2015-10-25 DIAGNOSIS — F329 Major depressive disorder, single episode, unspecified: Secondary | ICD-10-CM | POA: Diagnosis not present

## 2015-10-25 DIAGNOSIS — N3080 Other cystitis without hematuria: Secondary | ICD-10-CM | POA: Diagnosis not present

## 2015-10-25 DIAGNOSIS — N3081 Other cystitis with hematuria: Secondary | ICD-10-CM | POA: Diagnosis not present

## 2015-10-25 DIAGNOSIS — F419 Anxiety disorder, unspecified: Secondary | ICD-10-CM | POA: Diagnosis not present

## 2015-10-25 DIAGNOSIS — N329 Bladder disorder, unspecified: Secondary | ICD-10-CM | POA: Diagnosis not present

## 2015-10-25 DIAGNOSIS — Z79899 Other long term (current) drug therapy: Secondary | ICD-10-CM | POA: Diagnosis not present

## 2015-10-25 DIAGNOSIS — R319 Hematuria, unspecified: Secondary | ICD-10-CM | POA: Diagnosis not present

## 2015-10-25 DIAGNOSIS — K219 Gastro-esophageal reflux disease without esophagitis: Secondary | ICD-10-CM | POA: Diagnosis not present

## 2015-10-30 DIAGNOSIS — N390 Urinary tract infection, site not specified: Secondary | ICD-10-CM | POA: Diagnosis not present

## 2015-10-30 DIAGNOSIS — M79604 Pain in right leg: Secondary | ICD-10-CM | POA: Diagnosis not present

## 2015-10-30 DIAGNOSIS — J449 Chronic obstructive pulmonary disease, unspecified: Secondary | ICD-10-CM | POA: Diagnosis not present

## 2015-10-30 DIAGNOSIS — M79605 Pain in left leg: Secondary | ICD-10-CM | POA: Diagnosis not present

## 2015-10-30 DIAGNOSIS — J9690 Respiratory failure, unspecified, unspecified whether with hypoxia or hypercapnia: Secondary | ICD-10-CM | POA: Diagnosis not present

## 2015-10-30 DIAGNOSIS — I10 Essential (primary) hypertension: Secondary | ICD-10-CM | POA: Diagnosis not present

## 2015-11-02 DIAGNOSIS — H2512 Age-related nuclear cataract, left eye: Secondary | ICD-10-CM | POA: Diagnosis not present

## 2015-11-05 DIAGNOSIS — M79604 Pain in right leg: Secondary | ICD-10-CM | POA: Diagnosis not present

## 2015-11-05 DIAGNOSIS — J449 Chronic obstructive pulmonary disease, unspecified: Secondary | ICD-10-CM | POA: Diagnosis not present

## 2015-11-05 DIAGNOSIS — M79605 Pain in left leg: Secondary | ICD-10-CM | POA: Diagnosis not present

## 2015-11-05 DIAGNOSIS — N390 Urinary tract infection, site not specified: Secondary | ICD-10-CM | POA: Diagnosis not present

## 2015-11-05 DIAGNOSIS — N3090 Cystitis, unspecified without hematuria: Secondary | ICD-10-CM | POA: Diagnosis not present

## 2015-11-05 DIAGNOSIS — N3289 Other specified disorders of bladder: Secondary | ICD-10-CM | POA: Diagnosis not present

## 2015-11-05 DIAGNOSIS — J9690 Respiratory failure, unspecified, unspecified whether with hypoxia or hypercapnia: Secondary | ICD-10-CM | POA: Diagnosis not present

## 2015-11-05 DIAGNOSIS — I10 Essential (primary) hypertension: Secondary | ICD-10-CM | POA: Diagnosis not present

## 2015-11-12 DIAGNOSIS — M79605 Pain in left leg: Secondary | ICD-10-CM | POA: Diagnosis not present

## 2015-11-12 DIAGNOSIS — J449 Chronic obstructive pulmonary disease, unspecified: Secondary | ICD-10-CM | POA: Diagnosis not present

## 2015-11-12 DIAGNOSIS — M79604 Pain in right leg: Secondary | ICD-10-CM | POA: Diagnosis not present

## 2015-11-12 DIAGNOSIS — J9690 Respiratory failure, unspecified, unspecified whether with hypoxia or hypercapnia: Secondary | ICD-10-CM | POA: Diagnosis not present

## 2015-11-12 DIAGNOSIS — N390 Urinary tract infection, site not specified: Secondary | ICD-10-CM | POA: Diagnosis not present

## 2015-11-12 DIAGNOSIS — I10 Essential (primary) hypertension: Secondary | ICD-10-CM | POA: Diagnosis not present

## 2015-11-21 DIAGNOSIS — N390 Urinary tract infection, site not specified: Secondary | ICD-10-CM | POA: Diagnosis not present

## 2015-11-21 DIAGNOSIS — J449 Chronic obstructive pulmonary disease, unspecified: Secondary | ICD-10-CM | POA: Diagnosis not present

## 2015-11-21 DIAGNOSIS — I10 Essential (primary) hypertension: Secondary | ICD-10-CM | POA: Diagnosis not present

## 2015-11-21 DIAGNOSIS — M79605 Pain in left leg: Secondary | ICD-10-CM | POA: Diagnosis not present

## 2015-11-21 DIAGNOSIS — J9690 Respiratory failure, unspecified, unspecified whether with hypoxia or hypercapnia: Secondary | ICD-10-CM | POA: Diagnosis not present

## 2015-11-21 DIAGNOSIS — M79604 Pain in right leg: Secondary | ICD-10-CM | POA: Diagnosis not present

## 2015-11-22 DIAGNOSIS — Z95828 Presence of other vascular implants and grafts: Secondary | ICD-10-CM | POA: Diagnosis not present

## 2015-11-22 DIAGNOSIS — K551 Chronic vascular disorders of intestine: Secondary | ICD-10-CM | POA: Insufficient documentation

## 2015-11-22 DIAGNOSIS — I70219 Atherosclerosis of native arteries of extremities with intermittent claudication, unspecified extremity: Secondary | ICD-10-CM | POA: Insufficient documentation

## 2015-11-22 DIAGNOSIS — R600 Localized edema: Secondary | ICD-10-CM | POA: Diagnosis not present

## 2015-11-22 DIAGNOSIS — I70203 Unspecified atherosclerosis of native arteries of extremities, bilateral legs: Secondary | ICD-10-CM | POA: Diagnosis not present

## 2015-11-27 DIAGNOSIS — I110 Hypertensive heart disease with heart failure: Secondary | ICD-10-CM | POA: Diagnosis not present

## 2015-11-27 DIAGNOSIS — J449 Chronic obstructive pulmonary disease, unspecified: Secondary | ICD-10-CM | POA: Diagnosis not present

## 2015-11-27 DIAGNOSIS — I10 Essential (primary) hypertension: Secondary | ICD-10-CM | POA: Diagnosis not present

## 2015-11-27 DIAGNOSIS — I739 Peripheral vascular disease, unspecified: Secondary | ICD-10-CM | POA: Diagnosis not present

## 2015-11-27 DIAGNOSIS — F17201 Nicotine dependence, unspecified, in remission: Secondary | ICD-10-CM | POA: Diagnosis not present

## 2015-11-27 DIAGNOSIS — J9690 Respiratory failure, unspecified, unspecified whether with hypoxia or hypercapnia: Secondary | ICD-10-CM | POA: Diagnosis not present

## 2015-11-27 DIAGNOSIS — M79604 Pain in right leg: Secondary | ICD-10-CM | POA: Diagnosis not present

## 2015-11-27 DIAGNOSIS — G629 Polyneuropathy, unspecified: Secondary | ICD-10-CM | POA: Diagnosis not present

## 2015-11-27 DIAGNOSIS — N390 Urinary tract infection, site not specified: Secondary | ICD-10-CM | POA: Diagnosis not present

## 2015-11-27 DIAGNOSIS — M79605 Pain in left leg: Secondary | ICD-10-CM | POA: Diagnosis not present

## 2015-12-03 DIAGNOSIS — N3090 Cystitis, unspecified without hematuria: Secondary | ICD-10-CM | POA: Diagnosis not present

## 2015-12-05 DIAGNOSIS — R911 Solitary pulmonary nodule: Secondary | ICD-10-CM | POA: Diagnosis not present

## 2015-12-05 DIAGNOSIS — N390 Urinary tract infection, site not specified: Secondary | ICD-10-CM | POA: Diagnosis not present

## 2015-12-05 DIAGNOSIS — J449 Chronic obstructive pulmonary disease, unspecified: Secondary | ICD-10-CM | POA: Diagnosis not present

## 2015-12-14 DIAGNOSIS — N3090 Cystitis, unspecified without hematuria: Secondary | ICD-10-CM | POA: Diagnosis not present

## 2015-12-14 DIAGNOSIS — N3289 Other specified disorders of bladder: Secondary | ICD-10-CM | POA: Diagnosis not present

## 2015-12-17 DIAGNOSIS — R918 Other nonspecific abnormal finding of lung field: Secondary | ICD-10-CM | POA: Diagnosis not present

## 2015-12-17 DIAGNOSIS — R911 Solitary pulmonary nodule: Secondary | ICD-10-CM | POA: Diagnosis not present

## 2015-12-17 DIAGNOSIS — Z01812 Encounter for preprocedural laboratory examination: Secondary | ICD-10-CM | POA: Diagnosis not present

## 2015-12-26 DIAGNOSIS — K551 Chronic vascular disorders of intestine: Secondary | ICD-10-CM | POA: Diagnosis not present

## 2015-12-26 DIAGNOSIS — I6523 Occlusion and stenosis of bilateral carotid arteries: Secondary | ICD-10-CM | POA: Diagnosis not present

## 2015-12-26 DIAGNOSIS — I70203 Unspecified atherosclerosis of native arteries of extremities, bilateral legs: Secondary | ICD-10-CM | POA: Diagnosis not present

## 2015-12-26 DIAGNOSIS — Z95820 Peripheral vascular angioplasty status with implants and grafts: Secondary | ICD-10-CM | POA: Diagnosis not present

## 2015-12-28 DIAGNOSIS — J449 Chronic obstructive pulmonary disease, unspecified: Secondary | ICD-10-CM | POA: Diagnosis not present

## 2015-12-28 DIAGNOSIS — R918 Other nonspecific abnormal finding of lung field: Secondary | ICD-10-CM | POA: Insufficient documentation

## 2015-12-28 DIAGNOSIS — R05 Cough: Secondary | ICD-10-CM | POA: Diagnosis not present

## 2016-01-08 DIAGNOSIS — N3010 Interstitial cystitis (chronic) without hematuria: Secondary | ICD-10-CM | POA: Diagnosis not present

## 2016-01-08 DIAGNOSIS — G629 Polyneuropathy, unspecified: Secondary | ICD-10-CM | POA: Diagnosis not present

## 2016-01-08 DIAGNOSIS — I739 Peripheral vascular disease, unspecified: Secondary | ICD-10-CM | POA: Diagnosis not present

## 2016-01-08 DIAGNOSIS — E7800 Pure hypercholesterolemia, unspecified: Secondary | ICD-10-CM | POA: Diagnosis not present

## 2016-01-14 DIAGNOSIS — N390 Urinary tract infection, site not specified: Secondary | ICD-10-CM | POA: Diagnosis not present

## 2016-01-15 DIAGNOSIS — H353110 Nonexudative age-related macular degeneration, right eye, stage unspecified: Secondary | ICD-10-CM | POA: Diagnosis not present

## 2016-01-15 DIAGNOSIS — H2512 Age-related nuclear cataract, left eye: Secondary | ICD-10-CM | POA: Diagnosis not present

## 2016-01-16 DIAGNOSIS — N3289 Other specified disorders of bladder: Secondary | ICD-10-CM | POA: Diagnosis not present

## 2016-01-16 DIAGNOSIS — N390 Urinary tract infection, site not specified: Secondary | ICD-10-CM | POA: Diagnosis not present

## 2016-01-16 DIAGNOSIS — N3090 Cystitis, unspecified without hematuria: Secondary | ICD-10-CM | POA: Diagnosis not present

## 2016-01-17 DIAGNOSIS — J449 Chronic obstructive pulmonary disease, unspecified: Secondary | ICD-10-CM | POA: Diagnosis not present

## 2016-01-17 DIAGNOSIS — N390 Urinary tract infection, site not specified: Secondary | ICD-10-CM | POA: Diagnosis not present

## 2016-01-17 DIAGNOSIS — Z87891 Personal history of nicotine dependence: Secondary | ICD-10-CM | POA: Diagnosis not present

## 2016-01-17 DIAGNOSIS — I771 Stricture of artery: Secondary | ICD-10-CM | POA: Diagnosis not present

## 2016-01-17 DIAGNOSIS — I6523 Occlusion and stenosis of bilateral carotid arteries: Secondary | ICD-10-CM | POA: Diagnosis not present

## 2016-01-17 DIAGNOSIS — I10 Essential (primary) hypertension: Secondary | ICD-10-CM | POA: Diagnosis not present

## 2016-01-17 DIAGNOSIS — I70213 Atherosclerosis of native arteries of extremities with intermittent claudication, bilateral legs: Secondary | ICD-10-CM | POA: Diagnosis not present

## 2016-01-17 DIAGNOSIS — K551 Chronic vascular disorders of intestine: Secondary | ICD-10-CM | POA: Insufficient documentation

## 2016-01-18 DIAGNOSIS — N390 Urinary tract infection, site not specified: Secondary | ICD-10-CM | POA: Diagnosis not present

## 2016-01-19 DIAGNOSIS — B961 Klebsiella pneumoniae [K. pneumoniae] as the cause of diseases classified elsewhere: Secondary | ICD-10-CM | POA: Diagnosis not present

## 2016-01-19 DIAGNOSIS — Z602 Problems related to living alone: Secondary | ICD-10-CM | POA: Diagnosis not present

## 2016-01-19 DIAGNOSIS — Z87891 Personal history of nicotine dependence: Secondary | ICD-10-CM | POA: Diagnosis not present

## 2016-01-19 DIAGNOSIS — N3001 Acute cystitis with hematuria: Secondary | ICD-10-CM | POA: Diagnosis not present

## 2016-01-20 DIAGNOSIS — Z602 Problems related to living alone: Secondary | ICD-10-CM | POA: Diagnosis not present

## 2016-01-20 DIAGNOSIS — Z87891 Personal history of nicotine dependence: Secondary | ICD-10-CM | POA: Diagnosis not present

## 2016-01-20 DIAGNOSIS — N3001 Acute cystitis with hematuria: Secondary | ICD-10-CM | POA: Diagnosis not present

## 2016-01-20 DIAGNOSIS — B961 Klebsiella pneumoniae [K. pneumoniae] as the cause of diseases classified elsewhere: Secondary | ICD-10-CM | POA: Diagnosis not present

## 2016-01-22 DIAGNOSIS — Z87891 Personal history of nicotine dependence: Secondary | ICD-10-CM | POA: Diagnosis not present

## 2016-01-22 DIAGNOSIS — H2512 Age-related nuclear cataract, left eye: Secondary | ICD-10-CM | POA: Diagnosis not present

## 2016-01-22 DIAGNOSIS — J45909 Unspecified asthma, uncomplicated: Secondary | ICD-10-CM | POA: Diagnosis not present

## 2016-01-22 DIAGNOSIS — H3530 Unspecified macular degeneration: Secondary | ICD-10-CM | POA: Diagnosis not present

## 2016-01-22 DIAGNOSIS — K219 Gastro-esophageal reflux disease without esophagitis: Secondary | ICD-10-CM | POA: Diagnosis not present

## 2016-01-22 DIAGNOSIS — E785 Hyperlipidemia, unspecified: Secondary | ICD-10-CM | POA: Diagnosis not present

## 2016-01-22 DIAGNOSIS — I10 Essential (primary) hypertension: Secondary | ICD-10-CM | POA: Diagnosis not present

## 2016-01-22 DIAGNOSIS — J449 Chronic obstructive pulmonary disease, unspecified: Secondary | ICD-10-CM | POA: Diagnosis not present

## 2016-01-25 DIAGNOSIS — N3001 Acute cystitis with hematuria: Secondary | ICD-10-CM | POA: Diagnosis not present

## 2016-01-25 DIAGNOSIS — B961 Klebsiella pneumoniae [K. pneumoniae] as the cause of diseases classified elsewhere: Secondary | ICD-10-CM | POA: Diagnosis not present

## 2016-01-25 DIAGNOSIS — Z87891 Personal history of nicotine dependence: Secondary | ICD-10-CM | POA: Diagnosis not present

## 2016-01-25 DIAGNOSIS — Z602 Problems related to living alone: Secondary | ICD-10-CM | POA: Diagnosis not present

## 2016-01-30 DIAGNOSIS — Z87891 Personal history of nicotine dependence: Secondary | ICD-10-CM | POA: Diagnosis not present

## 2016-01-30 DIAGNOSIS — N3001 Acute cystitis with hematuria: Secondary | ICD-10-CM | POA: Diagnosis not present

## 2016-01-30 DIAGNOSIS — B961 Klebsiella pneumoniae [K. pneumoniae] as the cause of diseases classified elsewhere: Secondary | ICD-10-CM | POA: Diagnosis not present

## 2016-01-30 DIAGNOSIS — Z602 Problems related to living alone: Secondary | ICD-10-CM | POA: Diagnosis not present

## 2016-01-31 DIAGNOSIS — H25811 Combined forms of age-related cataract, right eye: Secondary | ICD-10-CM | POA: Diagnosis not present

## 2016-01-31 DIAGNOSIS — H2511 Age-related nuclear cataract, right eye: Secondary | ICD-10-CM | POA: Diagnosis not present

## 2016-02-01 DIAGNOSIS — N390 Urinary tract infection, site not specified: Secondary | ICD-10-CM | POA: Diagnosis not present

## 2016-02-05 DIAGNOSIS — Y92099 Unspecified place in other non-institutional residence as the place of occurrence of the external cause: Secondary | ICD-10-CM | POA: Diagnosis not present

## 2016-02-05 DIAGNOSIS — N3010 Interstitial cystitis (chronic) without hematuria: Secondary | ICD-10-CM | POA: Diagnosis not present

## 2016-02-05 DIAGNOSIS — M545 Low back pain: Secondary | ICD-10-CM | POA: Diagnosis not present

## 2016-02-05 DIAGNOSIS — W19XXXA Unspecified fall, initial encounter: Secondary | ICD-10-CM | POA: Diagnosis not present

## 2016-02-15 DIAGNOSIS — N952 Postmenopausal atrophic vaginitis: Secondary | ICD-10-CM | POA: Diagnosis not present

## 2016-02-15 DIAGNOSIS — N390 Urinary tract infection, site not specified: Secondary | ICD-10-CM | POA: Diagnosis not present

## 2016-03-03 DIAGNOSIS — N3010 Interstitial cystitis (chronic) without hematuria: Secondary | ICD-10-CM | POA: Diagnosis not present

## 2016-04-07 DIAGNOSIS — F5104 Psychophysiologic insomnia: Secondary | ICD-10-CM | POA: Diagnosis not present

## 2016-04-07 DIAGNOSIS — N3010 Interstitial cystitis (chronic) without hematuria: Secondary | ICD-10-CM | POA: Diagnosis not present

## 2016-04-07 DIAGNOSIS — I5032 Chronic diastolic (congestive) heart failure: Secondary | ICD-10-CM | POA: Diagnosis not present

## 2016-04-07 DIAGNOSIS — I739 Peripheral vascular disease, unspecified: Secondary | ICD-10-CM | POA: Diagnosis not present

## 2016-04-22 DIAGNOSIS — J449 Chronic obstructive pulmonary disease, unspecified: Secondary | ICD-10-CM | POA: Diagnosis not present

## 2016-04-22 DIAGNOSIS — J441 Chronic obstructive pulmonary disease with (acute) exacerbation: Secondary | ICD-10-CM | POA: Diagnosis not present

## 2016-04-22 DIAGNOSIS — F17201 Nicotine dependence, unspecified, in remission: Secondary | ICD-10-CM | POA: Diagnosis not present

## 2016-04-22 DIAGNOSIS — I110 Hypertensive heart disease with heart failure: Secondary | ICD-10-CM | POA: Diagnosis not present

## 2016-04-22 DIAGNOSIS — N3010 Interstitial cystitis (chronic) without hematuria: Secondary | ICD-10-CM | POA: Diagnosis not present

## 2016-05-20 DIAGNOSIS — E876 Hypokalemia: Secondary | ICD-10-CM | POA: Diagnosis not present

## 2016-05-20 DIAGNOSIS — J449 Chronic obstructive pulmonary disease, unspecified: Secondary | ICD-10-CM | POA: Diagnosis not present

## 2016-05-20 DIAGNOSIS — I10 Essential (primary) hypertension: Secondary | ICD-10-CM | POA: Diagnosis not present

## 2016-05-20 DIAGNOSIS — R918 Other nonspecific abnormal finding of lung field: Secondary | ICD-10-CM | POA: Diagnosis not present

## 2016-05-20 DIAGNOSIS — D649 Anemia, unspecified: Secondary | ICD-10-CM | POA: Diagnosis not present

## 2016-05-20 DIAGNOSIS — R0602 Shortness of breath: Secondary | ICD-10-CM | POA: Diagnosis not present

## 2016-05-20 DIAGNOSIS — R000 Tachycardia, unspecified: Secondary | ICD-10-CM | POA: Diagnosis not present

## 2016-05-22 DIAGNOSIS — I739 Peripheral vascular disease, unspecified: Secondary | ICD-10-CM | POA: Diagnosis not present

## 2016-05-22 DIAGNOSIS — Z95828 Presence of other vascular implants and grafts: Secondary | ICD-10-CM | POA: Diagnosis not present

## 2016-05-22 DIAGNOSIS — R000 Tachycardia, unspecified: Secondary | ICD-10-CM | POA: Diagnosis not present

## 2016-05-22 DIAGNOSIS — I7410 Embolism and thrombosis of unspecified parts of aorta: Secondary | ICD-10-CM | POA: Diagnosis not present

## 2016-05-22 DIAGNOSIS — R12 Heartburn: Secondary | ICD-10-CM | POA: Diagnosis not present

## 2016-05-22 DIAGNOSIS — R0602 Shortness of breath: Secondary | ICD-10-CM | POA: Diagnosis not present

## 2016-05-22 DIAGNOSIS — J430 Unilateral pulmonary emphysema [MacLeod's syndrome]: Secondary | ICD-10-CM | POA: Diagnosis not present

## 2016-06-04 DIAGNOSIS — I7410 Embolism and thrombosis of unspecified parts of aorta: Secondary | ICD-10-CM | POA: Diagnosis not present

## 2016-06-04 DIAGNOSIS — I739 Peripheral vascular disease, unspecified: Secondary | ICD-10-CM | POA: Diagnosis not present

## 2016-06-04 DIAGNOSIS — R0602 Shortness of breath: Secondary | ICD-10-CM | POA: Diagnosis not present

## 2016-06-04 DIAGNOSIS — R000 Tachycardia, unspecified: Secondary | ICD-10-CM | POA: Diagnosis not present

## 2016-06-04 DIAGNOSIS — J430 Unilateral pulmonary emphysema [MacLeod's syndrome]: Secondary | ICD-10-CM | POA: Diagnosis not present

## 2016-06-04 DIAGNOSIS — Z95828 Presence of other vascular implants and grafts: Secondary | ICD-10-CM | POA: Diagnosis not present

## 2016-06-04 DIAGNOSIS — R12 Heartburn: Secondary | ICD-10-CM | POA: Diagnosis not present

## 2016-06-06 DIAGNOSIS — R000 Tachycardia, unspecified: Secondary | ICD-10-CM | POA: Diagnosis not present

## 2016-07-05 DIAGNOSIS — R000 Tachycardia, unspecified: Secondary | ICD-10-CM | POA: Diagnosis not present

## 2016-07-05 DIAGNOSIS — J430 Unilateral pulmonary emphysema [MacLeod's syndrome]: Secondary | ICD-10-CM | POA: Diagnosis not present

## 2016-07-05 DIAGNOSIS — I7410 Embolism and thrombosis of unspecified parts of aorta: Secondary | ICD-10-CM | POA: Diagnosis not present

## 2016-07-05 DIAGNOSIS — R12 Heartburn: Secondary | ICD-10-CM | POA: Diagnosis not present

## 2016-07-05 DIAGNOSIS — R0602 Shortness of breath: Secondary | ICD-10-CM | POA: Diagnosis not present

## 2016-07-05 DIAGNOSIS — I739 Peripheral vascular disease, unspecified: Secondary | ICD-10-CM | POA: Diagnosis not present

## 2016-07-05 DIAGNOSIS — Z95828 Presence of other vascular implants and grafts: Secondary | ICD-10-CM | POA: Diagnosis not present

## 2016-07-09 DIAGNOSIS — R0602 Shortness of breath: Secondary | ICD-10-CM | POA: Diagnosis not present

## 2016-07-09 DIAGNOSIS — I739 Peripheral vascular disease, unspecified: Secondary | ICD-10-CM | POA: Diagnosis not present

## 2016-07-09 DIAGNOSIS — I7410 Embolism and thrombosis of unspecified parts of aorta: Secondary | ICD-10-CM | POA: Diagnosis not present

## 2016-07-09 DIAGNOSIS — Z95828 Presence of other vascular implants and grafts: Secondary | ICD-10-CM | POA: Diagnosis not present

## 2016-07-09 DIAGNOSIS — J431 Panlobular emphysema: Secondary | ICD-10-CM | POA: Diagnosis not present

## 2016-07-10 DIAGNOSIS — J449 Chronic obstructive pulmonary disease, unspecified: Secondary | ICD-10-CM | POA: Diagnosis not present

## 2016-07-10 DIAGNOSIS — R000 Tachycardia, unspecified: Secondary | ICD-10-CM | POA: Diagnosis not present

## 2016-07-10 DIAGNOSIS — Z87891 Personal history of nicotine dependence: Secondary | ICD-10-CM | POA: Diagnosis not present

## 2016-07-10 DIAGNOSIS — R918 Other nonspecific abnormal finding of lung field: Secondary | ICD-10-CM | POA: Diagnosis not present

## 2016-07-10 DIAGNOSIS — R0902 Hypoxemia: Secondary | ICD-10-CM | POA: Insufficient documentation

## 2016-07-21 DIAGNOSIS — I6523 Occlusion and stenosis of bilateral carotid arteries: Secondary | ICD-10-CM | POA: Diagnosis not present

## 2016-07-21 DIAGNOSIS — I70203 Unspecified atherosclerosis of native arteries of extremities, bilateral legs: Secondary | ICD-10-CM | POA: Diagnosis not present

## 2016-07-21 DIAGNOSIS — Z95820 Peripheral vascular angioplasty status with implants and grafts: Secondary | ICD-10-CM | POA: Diagnosis not present

## 2016-07-21 DIAGNOSIS — I771 Stricture of artery: Secondary | ICD-10-CM | POA: Diagnosis not present

## 2016-07-21 DIAGNOSIS — Z87891 Personal history of nicotine dependence: Secondary | ICD-10-CM | POA: Diagnosis not present

## 2016-07-21 DIAGNOSIS — J449 Chronic obstructive pulmonary disease, unspecified: Secondary | ICD-10-CM | POA: Diagnosis not present

## 2016-07-21 DIAGNOSIS — I70213 Atherosclerosis of native arteries of extremities with intermittent claudication, bilateral legs: Secondary | ICD-10-CM | POA: Diagnosis not present

## 2016-07-21 DIAGNOSIS — I10 Essential (primary) hypertension: Secondary | ICD-10-CM | POA: Diagnosis not present

## 2016-07-22 DIAGNOSIS — N3090 Cystitis, unspecified without hematuria: Secondary | ICD-10-CM | POA: Diagnosis not present

## 2016-07-22 DIAGNOSIS — R300 Dysuria: Secondary | ICD-10-CM | POA: Diagnosis not present

## 2016-07-31 DIAGNOSIS — J449 Chronic obstructive pulmonary disease, unspecified: Secondary | ICD-10-CM | POA: Diagnosis not present

## 2016-09-02 DIAGNOSIS — N3289 Other specified disorders of bladder: Secondary | ICD-10-CM | POA: Diagnosis not present

## 2016-09-02 DIAGNOSIS — N390 Urinary tract infection, site not specified: Secondary | ICD-10-CM | POA: Diagnosis not present

## 2016-09-02 DIAGNOSIS — N952 Postmenopausal atrophic vaginitis: Secondary | ICD-10-CM | POA: Diagnosis not present

## 2016-09-25 DIAGNOSIS — H353221 Exudative age-related macular degeneration, left eye, with active choroidal neovascularization: Secondary | ICD-10-CM | POA: Diagnosis not present

## 2016-10-20 DIAGNOSIS — J449 Chronic obstructive pulmonary disease, unspecified: Secondary | ICD-10-CM | POA: Diagnosis not present

## 2016-10-20 DIAGNOSIS — R0902 Hypoxemia: Secondary | ICD-10-CM | POA: Diagnosis not present

## 2016-10-20 DIAGNOSIS — G4734 Idiopathic sleep related nonobstructive alveolar hypoventilation: Secondary | ICD-10-CM | POA: Diagnosis not present

## 2016-10-21 DIAGNOSIS — Z0000 Encounter for general adult medical examination without abnormal findings: Secondary | ICD-10-CM | POA: Diagnosis not present

## 2016-10-21 DIAGNOSIS — J449 Chronic obstructive pulmonary disease, unspecified: Secondary | ICD-10-CM | POA: Diagnosis not present

## 2016-10-21 DIAGNOSIS — I110 Hypertensive heart disease with heart failure: Secondary | ICD-10-CM | POA: Diagnosis not present

## 2016-10-21 DIAGNOSIS — J9611 Chronic respiratory failure with hypoxia: Secondary | ICD-10-CM | POA: Diagnosis not present

## 2016-10-21 DIAGNOSIS — E785 Hyperlipidemia, unspecified: Secondary | ICD-10-CM | POA: Diagnosis not present

## 2016-11-06 DIAGNOSIS — H353221 Exudative age-related macular degeneration, left eye, with active choroidal neovascularization: Secondary | ICD-10-CM | POA: Diagnosis not present

## 2016-11-10 DIAGNOSIS — N3289 Other specified disorders of bladder: Secondary | ICD-10-CM | POA: Diagnosis not present

## 2016-11-10 DIAGNOSIS — N3090 Cystitis, unspecified without hematuria: Secondary | ICD-10-CM | POA: Diagnosis not present

## 2016-11-17 IMAGING — US US RENAL
1 series · 14 of 25 positions shown · non-contrast
Comparison: None.

CLINICAL DATA: 69-year-old post of aortobifemoral bypass grafting
04/26/2015 with acute thrombosis of the graft and thrombectomy on
05/02/2015. Patient was unable to wean from the ventilator and
developed community acquired pneumonia. Patient now with 6 day
history of hematuria.

EXAM:
RENAL / URINARY TRACT ULTRASOUND COMPLETE

[Series 1: us renal · 0.21mm/px · 14 of 32 slices shown]
[im 1/32]
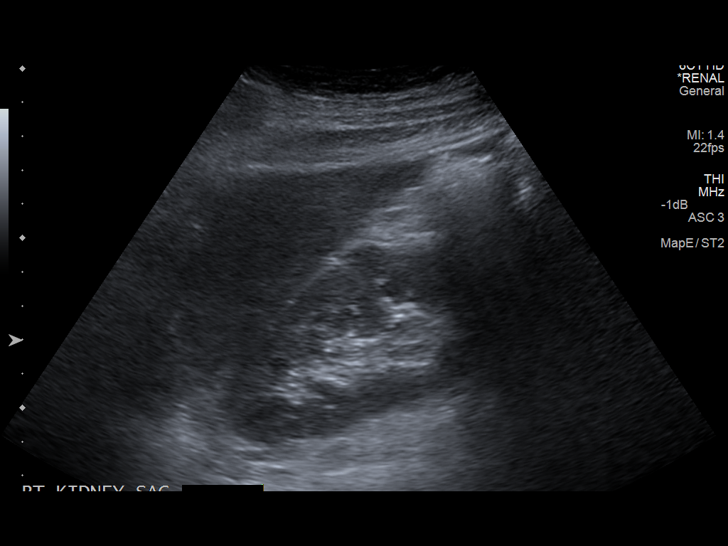
[im 3/32]
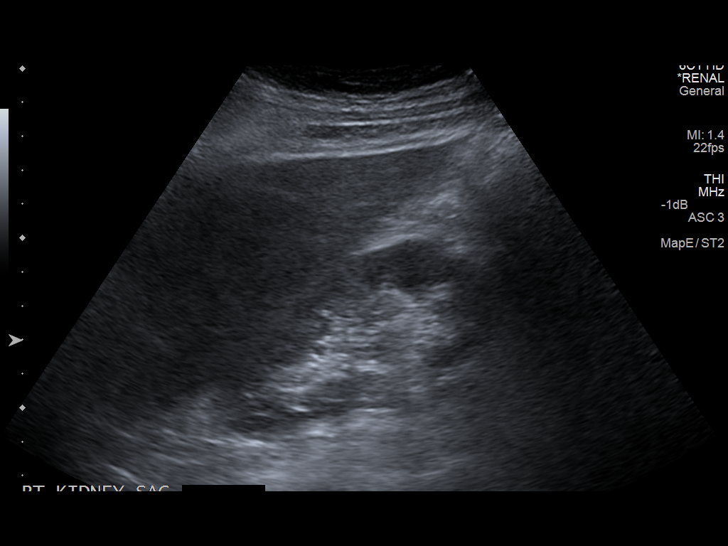
[im 6/32]
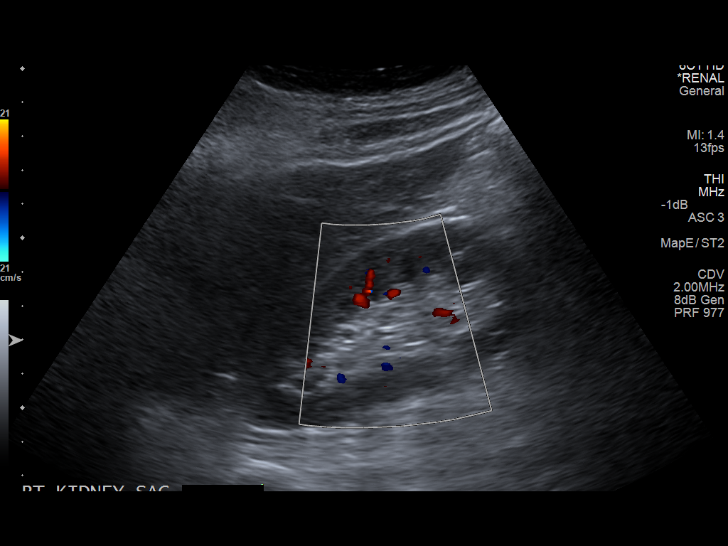
[im 8/32]
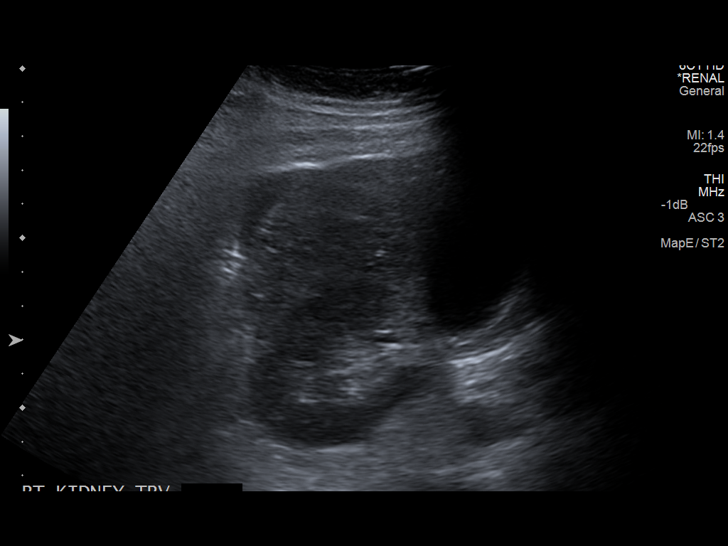
[im 11/32]
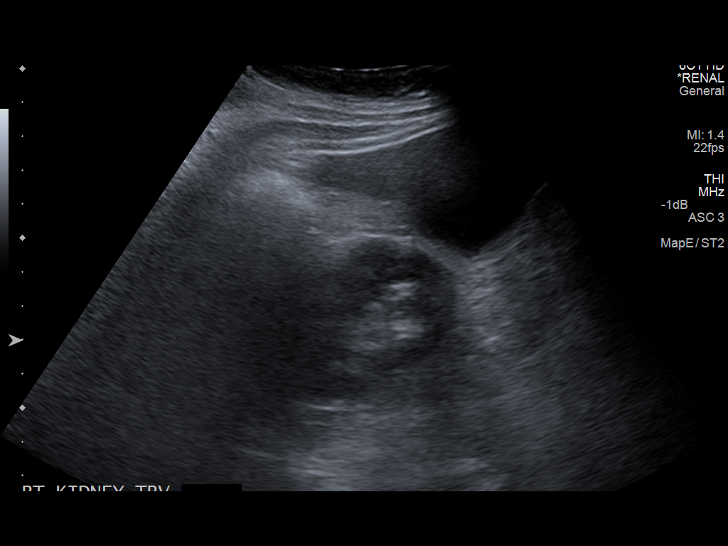
[im 12/32]
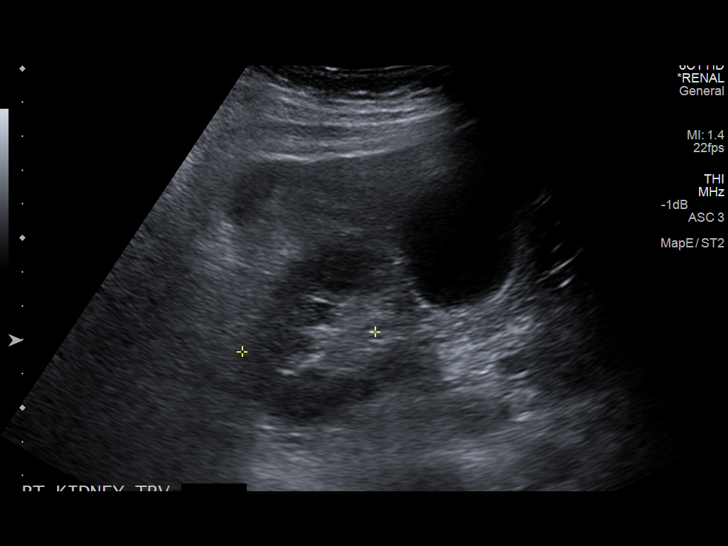
[im 15/32]
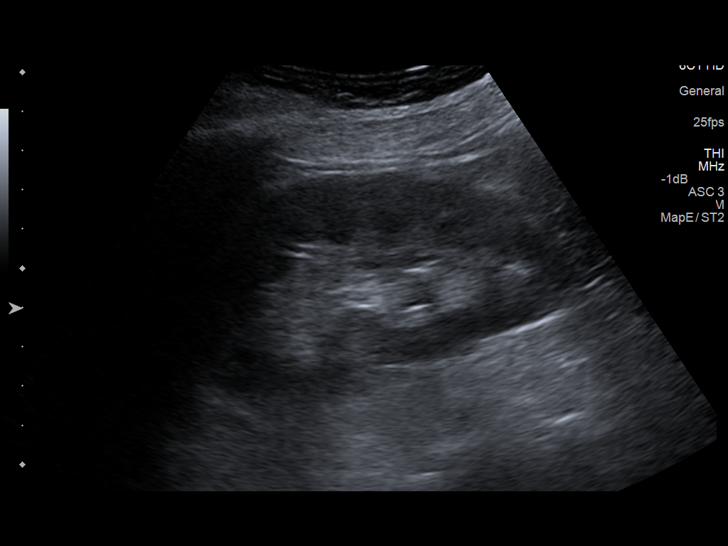
[im 17/32]
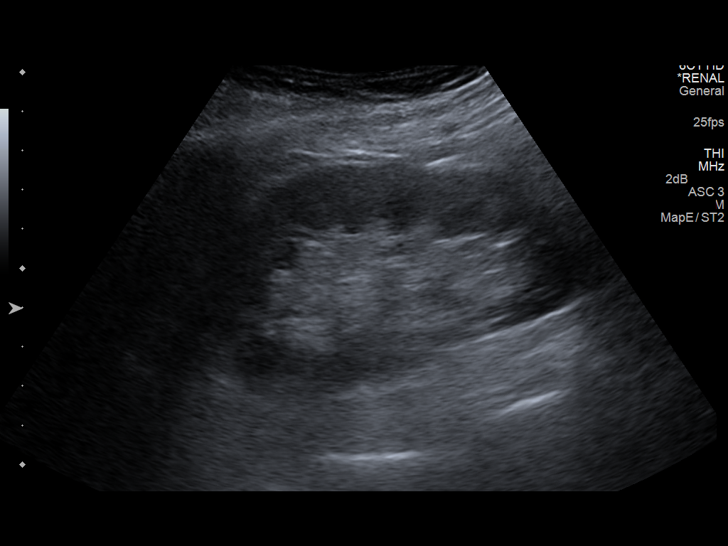
[im 20/32]
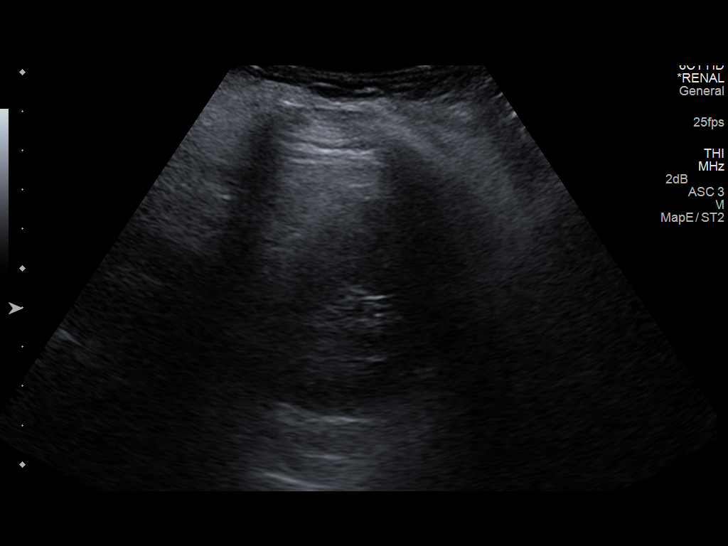
[im 21/32]
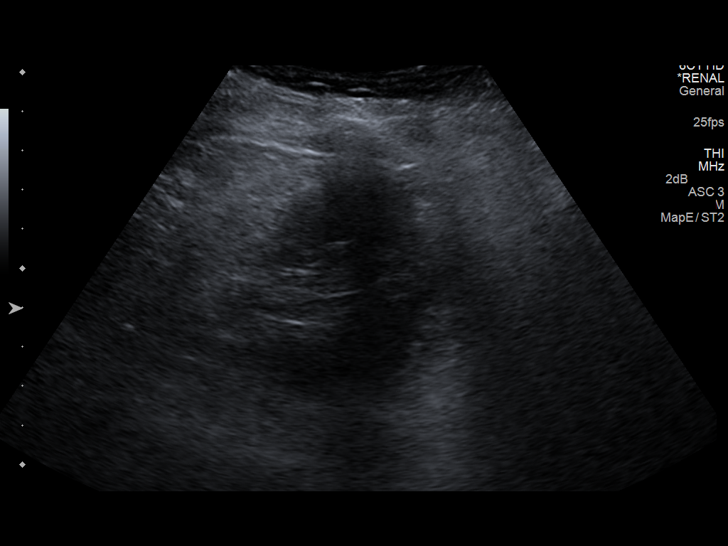
[im 24/32]
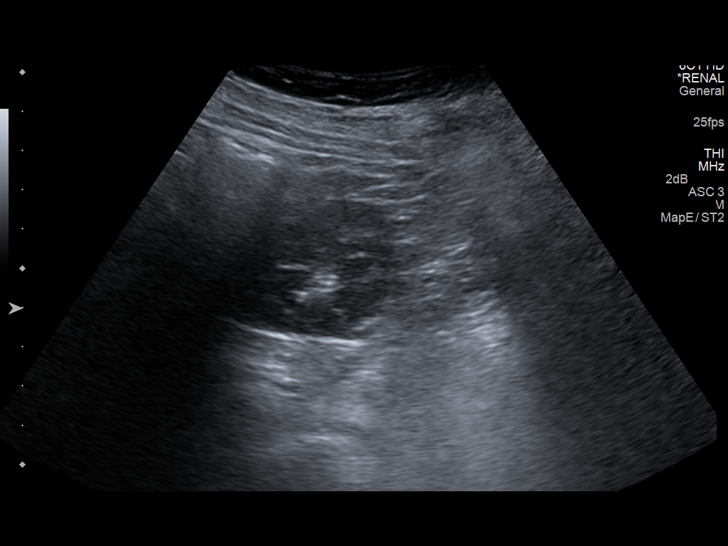
[im 26/32]
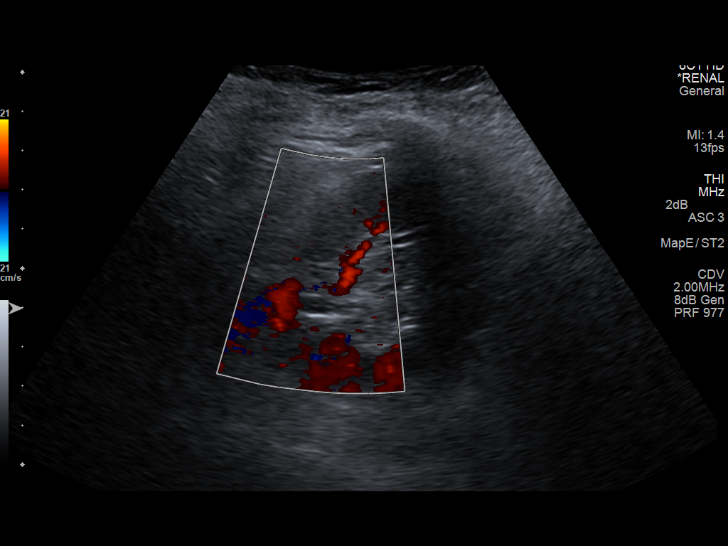
[im 29/32]
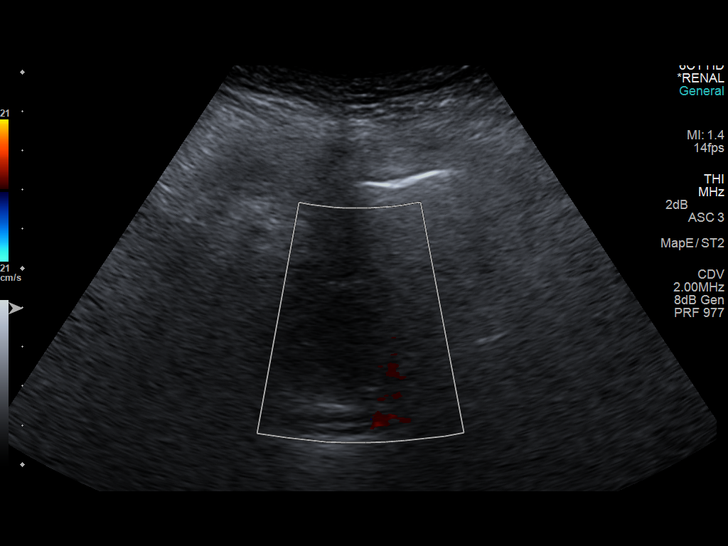
[im 32/32]
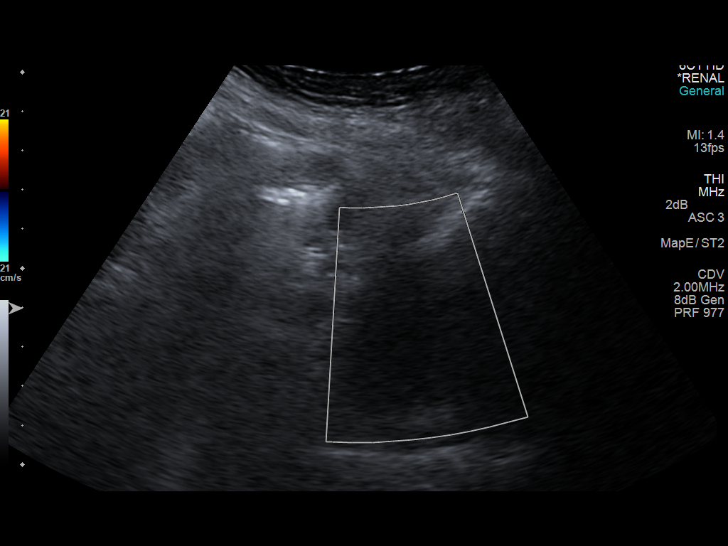

[14 of 25 positions shown; findings below may reference images not displayed]

FINDINGS: Right Kidney:

Length: Approximately 9.2 cm. No hydronephrosis. Well-preserved
cortex. No shadowing calculi. Normal parenchymal echotexture. No
focal parenchymal abnormality.

Left Kidney:

Length: Approximately 9.5 cm. No hydronephrosis. Well-preserved
cortex. No shadowing calculi. Normal parenchymal echotexture. No
focal parenchymal abnormality.

Bladder:

Decompressed by Foley catheter.
IMPRESSION: Normal examination.

## 2016-12-11 DIAGNOSIS — N390 Urinary tract infection, site not specified: Secondary | ICD-10-CM | POA: Diagnosis not present

## 2016-12-18 DIAGNOSIS — H353221 Exudative age-related macular degeneration, left eye, with active choroidal neovascularization: Secondary | ICD-10-CM | POA: Diagnosis not present

## 2017-01-13 DIAGNOSIS — N390 Urinary tract infection, site not specified: Secondary | ICD-10-CM | POA: Diagnosis not present

## 2017-01-20 DIAGNOSIS — I739 Peripheral vascular disease, unspecified: Secondary | ICD-10-CM | POA: Diagnosis not present

## 2017-01-20 DIAGNOSIS — I6523 Occlusion and stenosis of bilateral carotid arteries: Secondary | ICD-10-CM | POA: Diagnosis not present

## 2017-01-20 DIAGNOSIS — I771 Stricture of artery: Secondary | ICD-10-CM | POA: Diagnosis not present

## 2017-01-20 DIAGNOSIS — I10 Essential (primary) hypertension: Secondary | ICD-10-CM | POA: Diagnosis not present

## 2017-01-20 DIAGNOSIS — I70203 Unspecified atherosclerosis of native arteries of extremities, bilateral legs: Secondary | ICD-10-CM | POA: Diagnosis not present

## 2017-01-20 DIAGNOSIS — Z87891 Personal history of nicotine dependence: Secondary | ICD-10-CM | POA: Diagnosis not present

## 2017-01-20 DIAGNOSIS — J449 Chronic obstructive pulmonary disease, unspecified: Secondary | ICD-10-CM | POA: Diagnosis not present

## 2017-01-20 DIAGNOSIS — Z95820 Peripheral vascular angioplasty status with implants and grafts: Secondary | ICD-10-CM | POA: Diagnosis not present

## 2017-01-29 DIAGNOSIS — H353221 Exudative age-related macular degeneration, left eye, with active choroidal neovascularization: Secondary | ICD-10-CM | POA: Diagnosis not present

## 2017-02-10 DIAGNOSIS — K5900 Constipation, unspecified: Secondary | ICD-10-CM | POA: Diagnosis not present

## 2017-02-10 DIAGNOSIS — N3090 Cystitis, unspecified without hematuria: Secondary | ICD-10-CM | POA: Diagnosis not present

## 2017-02-10 DIAGNOSIS — N952 Postmenopausal atrophic vaginitis: Secondary | ICD-10-CM | POA: Diagnosis not present

## 2017-02-12 DIAGNOSIS — N3010 Interstitial cystitis (chronic) without hematuria: Secondary | ICD-10-CM | POA: Diagnosis not present

## 2017-02-12 DIAGNOSIS — J449 Chronic obstructive pulmonary disease, unspecified: Secondary | ICD-10-CM | POA: Diagnosis not present

## 2017-02-12 DIAGNOSIS — G2581 Restless legs syndrome: Secondary | ICD-10-CM | POA: Diagnosis not present

## 2017-02-12 DIAGNOSIS — Z79899 Other long term (current) drug therapy: Secondary | ICD-10-CM | POA: Diagnosis not present

## 2017-02-12 DIAGNOSIS — J9611 Chronic respiratory failure with hypoxia: Secondary | ICD-10-CM | POA: Diagnosis not present

## 2017-02-12 DIAGNOSIS — G629 Polyneuropathy, unspecified: Secondary | ICD-10-CM | POA: Diagnosis not present

## 2017-02-26 DIAGNOSIS — N390 Urinary tract infection, site not specified: Secondary | ICD-10-CM | POA: Diagnosis not present

## 2017-03-05 DIAGNOSIS — H353221 Exudative age-related macular degeneration, left eye, with active choroidal neovascularization: Secondary | ICD-10-CM | POA: Diagnosis not present

## 2017-03-17 DIAGNOSIS — N390 Urinary tract infection, site not specified: Secondary | ICD-10-CM | POA: Diagnosis not present

## 2017-03-19 DIAGNOSIS — H353211 Exudative age-related macular degeneration, right eye, with active choroidal neovascularization: Secondary | ICD-10-CM | POA: Diagnosis not present

## 2017-04-09 DIAGNOSIS — H353221 Exudative age-related macular degeneration, left eye, with active choroidal neovascularization: Secondary | ICD-10-CM | POA: Diagnosis not present

## 2017-05-10 IMAGING — CR DG ABD PORTABLE 1V
1 series · 1 of 1 positions shown · non-contrast
Comparison: None.

CLINICAL DATA: Enteric tube placement.

EXAM:
PORTABLE ABDOMEN - 1 VIEW

[AP]
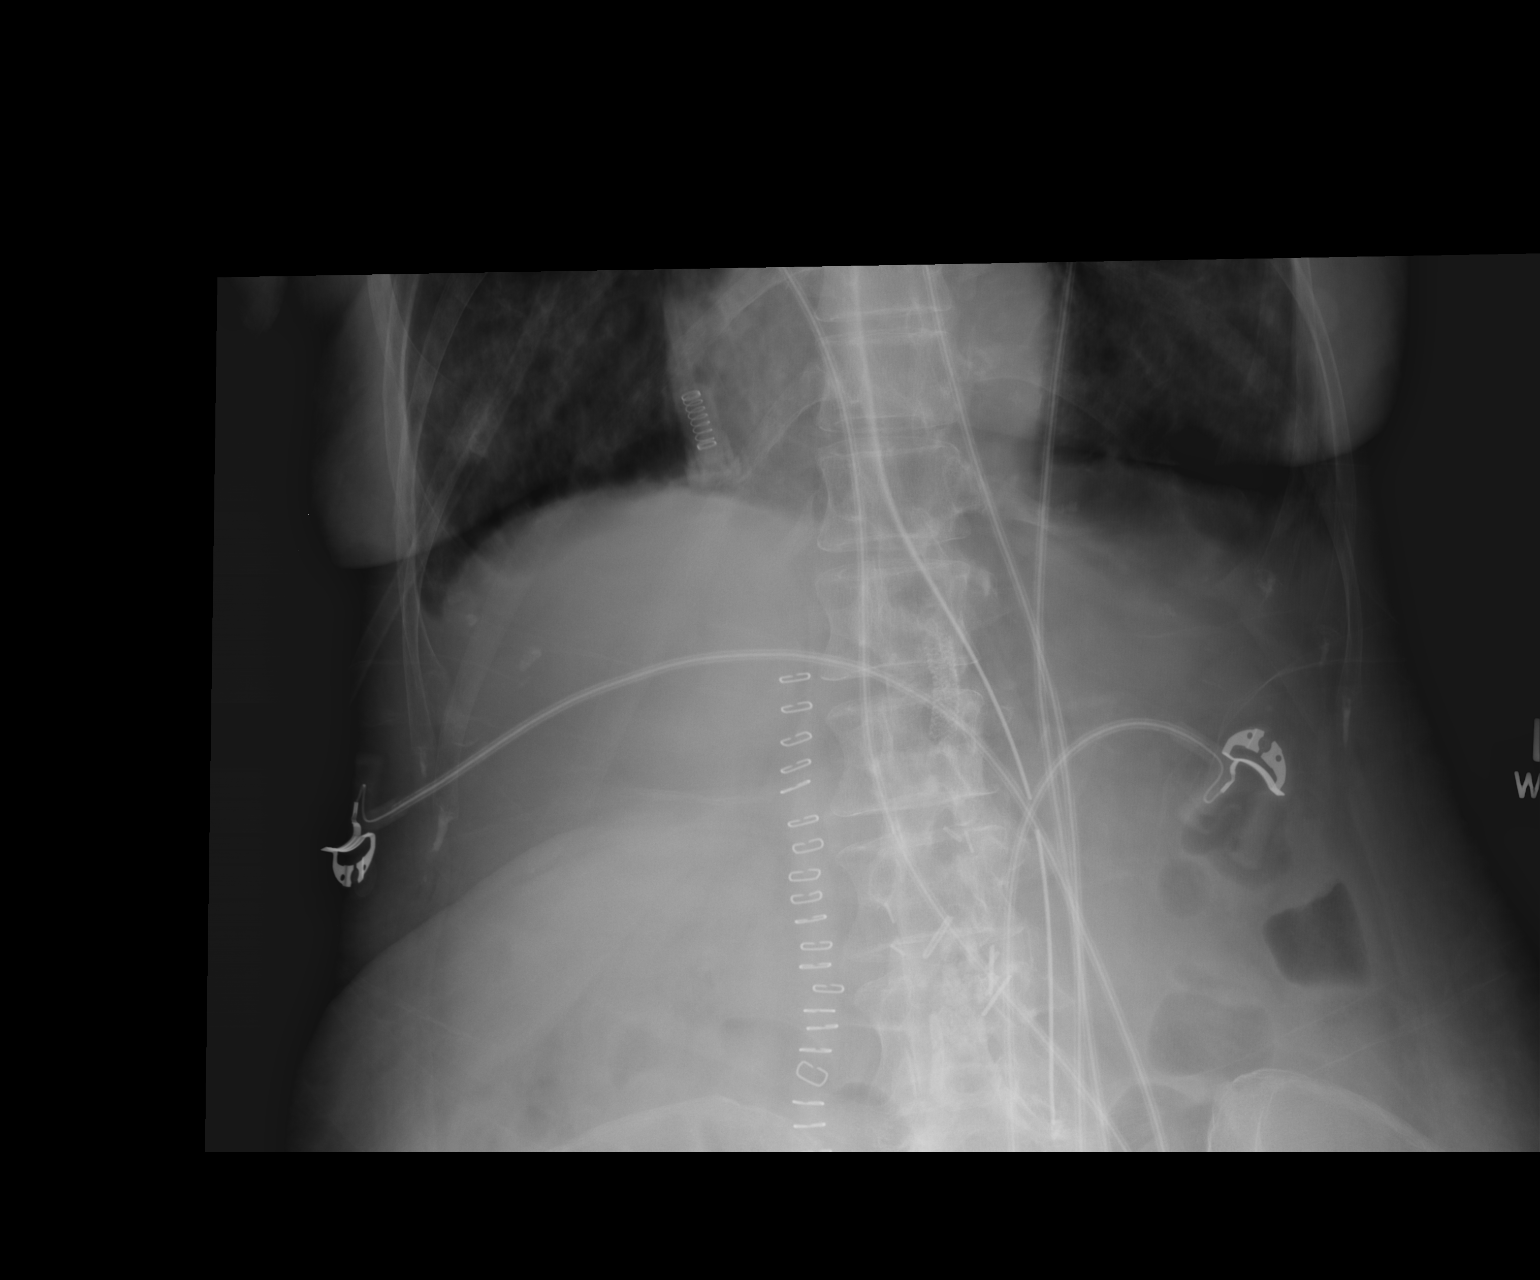

[1 of 1 positions shown; findings below may reference images not displayed]

FINDINGS: Exam demonstrates an enteric tube with tip over the left mid to
lower abdomen and side-port in the left mid to upper abdomen. Bowel
gas pattern is nonobstructive. There are surgical clips over the
midline abdomen as well as skin staples vertically just right of
midline. Metallic stent like structure over the upper abdomen just
left of midline. Mild degenerate change of the spine.
IMPRESSION: Nonobstructive bowel gas pattern.

Enteric tube with tip over the left mid to lower abdomen and
side-port over the mid to upper abdomen.

## 2017-05-11 IMAGING — CR DG CHEST 1V PORT
1 series · 1 of 1 positions shown · non-contrast
Comparison: None.

CLINICAL DATA: Respiratory failure.

EXAM:
PORTABLE CHEST - 1 VIEW

[AP]
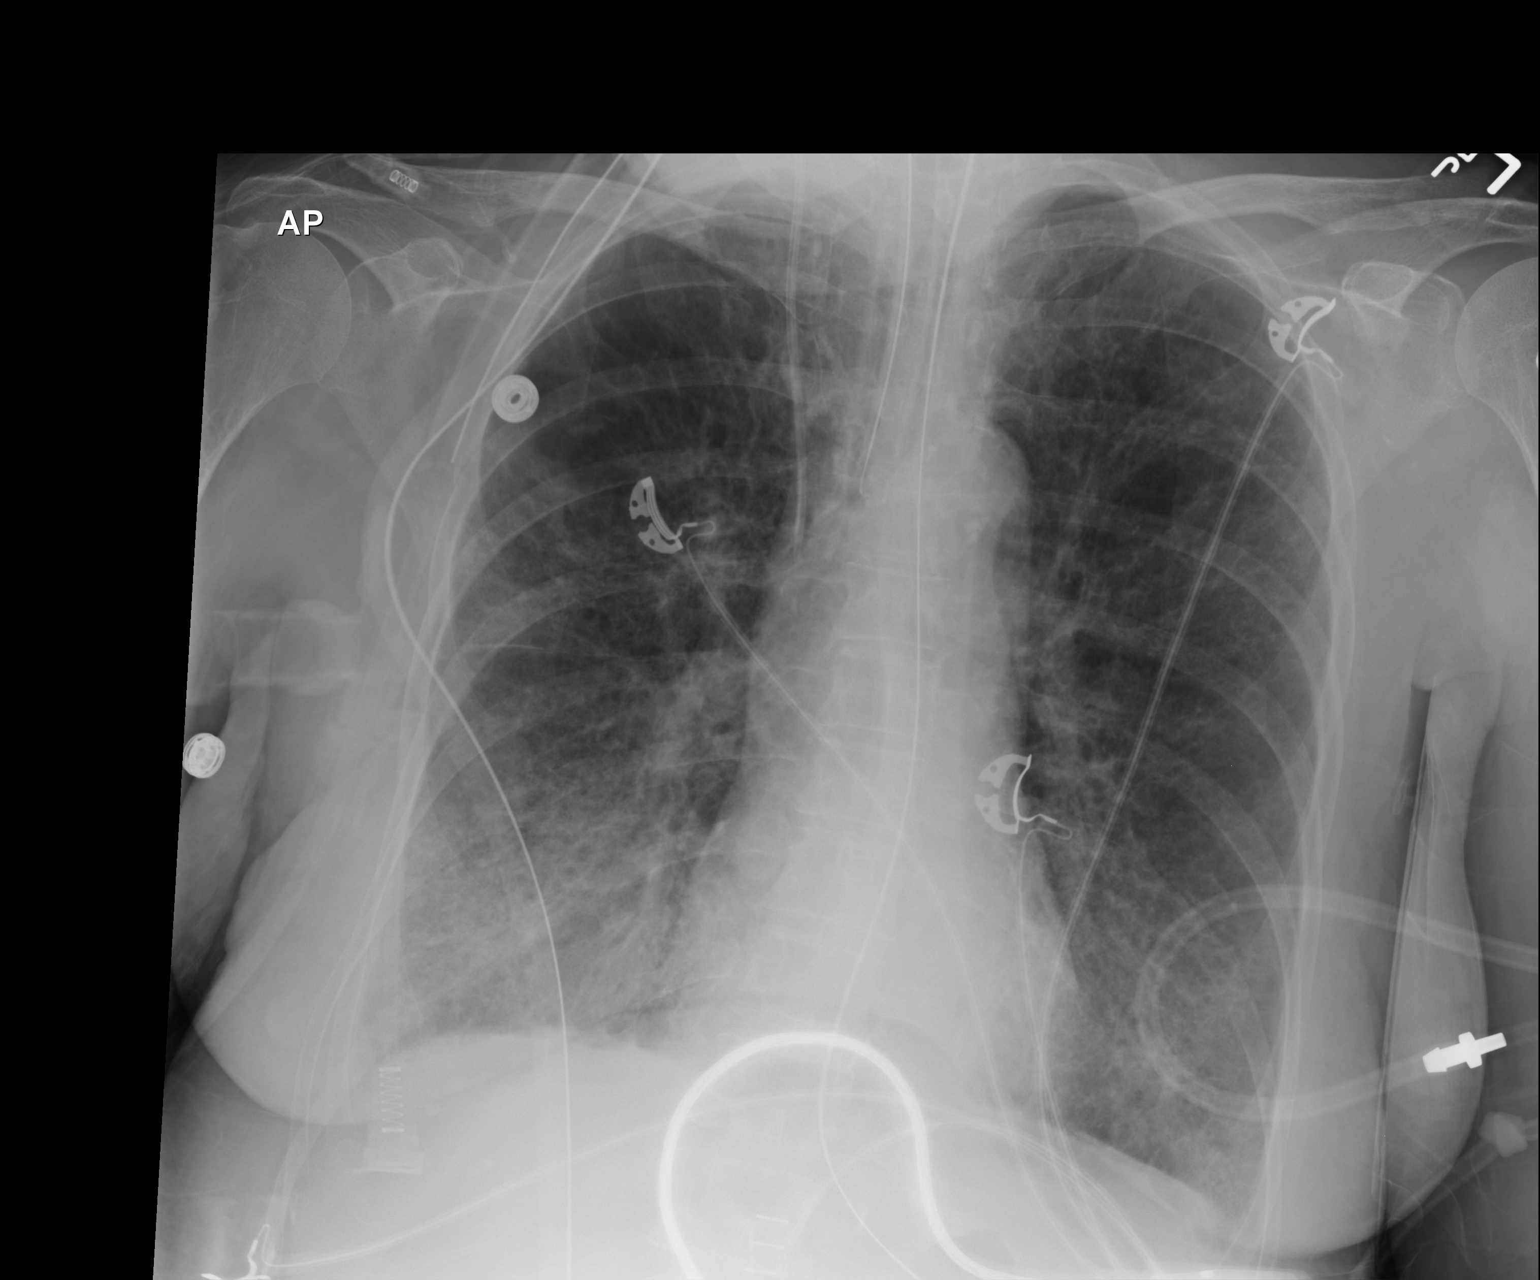

[1 of 1 positions shown; findings below may reference images not displayed]

FINDINGS: Endotracheal tube, NG tube, right IJ line in good anatomic position.
Heart size normal. Multifocal bilateral pulmonary infiltrates
particularly prominent in the right lung base. Findings consist with
multifocal pneumonia. No pleural effusion or pneumothorax. No acute
osseous abnormality.
IMPRESSION: 1. Lines and tubes in good anatomic position.
2. Multifocal bilateral pulmonary infiltrates, particular prominent
in the right lung base. Findings consistent with multifocal
pneumonia.

## 2017-05-12 DIAGNOSIS — R600 Localized edema: Secondary | ICD-10-CM | POA: Diagnosis not present

## 2017-05-12 DIAGNOSIS — Z87891 Personal history of nicotine dependence: Secondary | ICD-10-CM | POA: Diagnosis not present

## 2017-05-12 DIAGNOSIS — R918 Other nonspecific abnormal finding of lung field: Secondary | ICD-10-CM | POA: Diagnosis not present

## 2017-05-12 DIAGNOSIS — J449 Chronic obstructive pulmonary disease, unspecified: Secondary | ICD-10-CM | POA: Diagnosis not present

## 2017-05-13 IMAGING — CR DG CHEST 1V PORT
2 series · 2 of 2 positions shown · non-contrast
Comparison: 05/11/2015

CLINICAL DATA: Respiratory failure.

EXAM:
PORTABLE CHEST - 1 VIEW

[AP (1 of 2)]
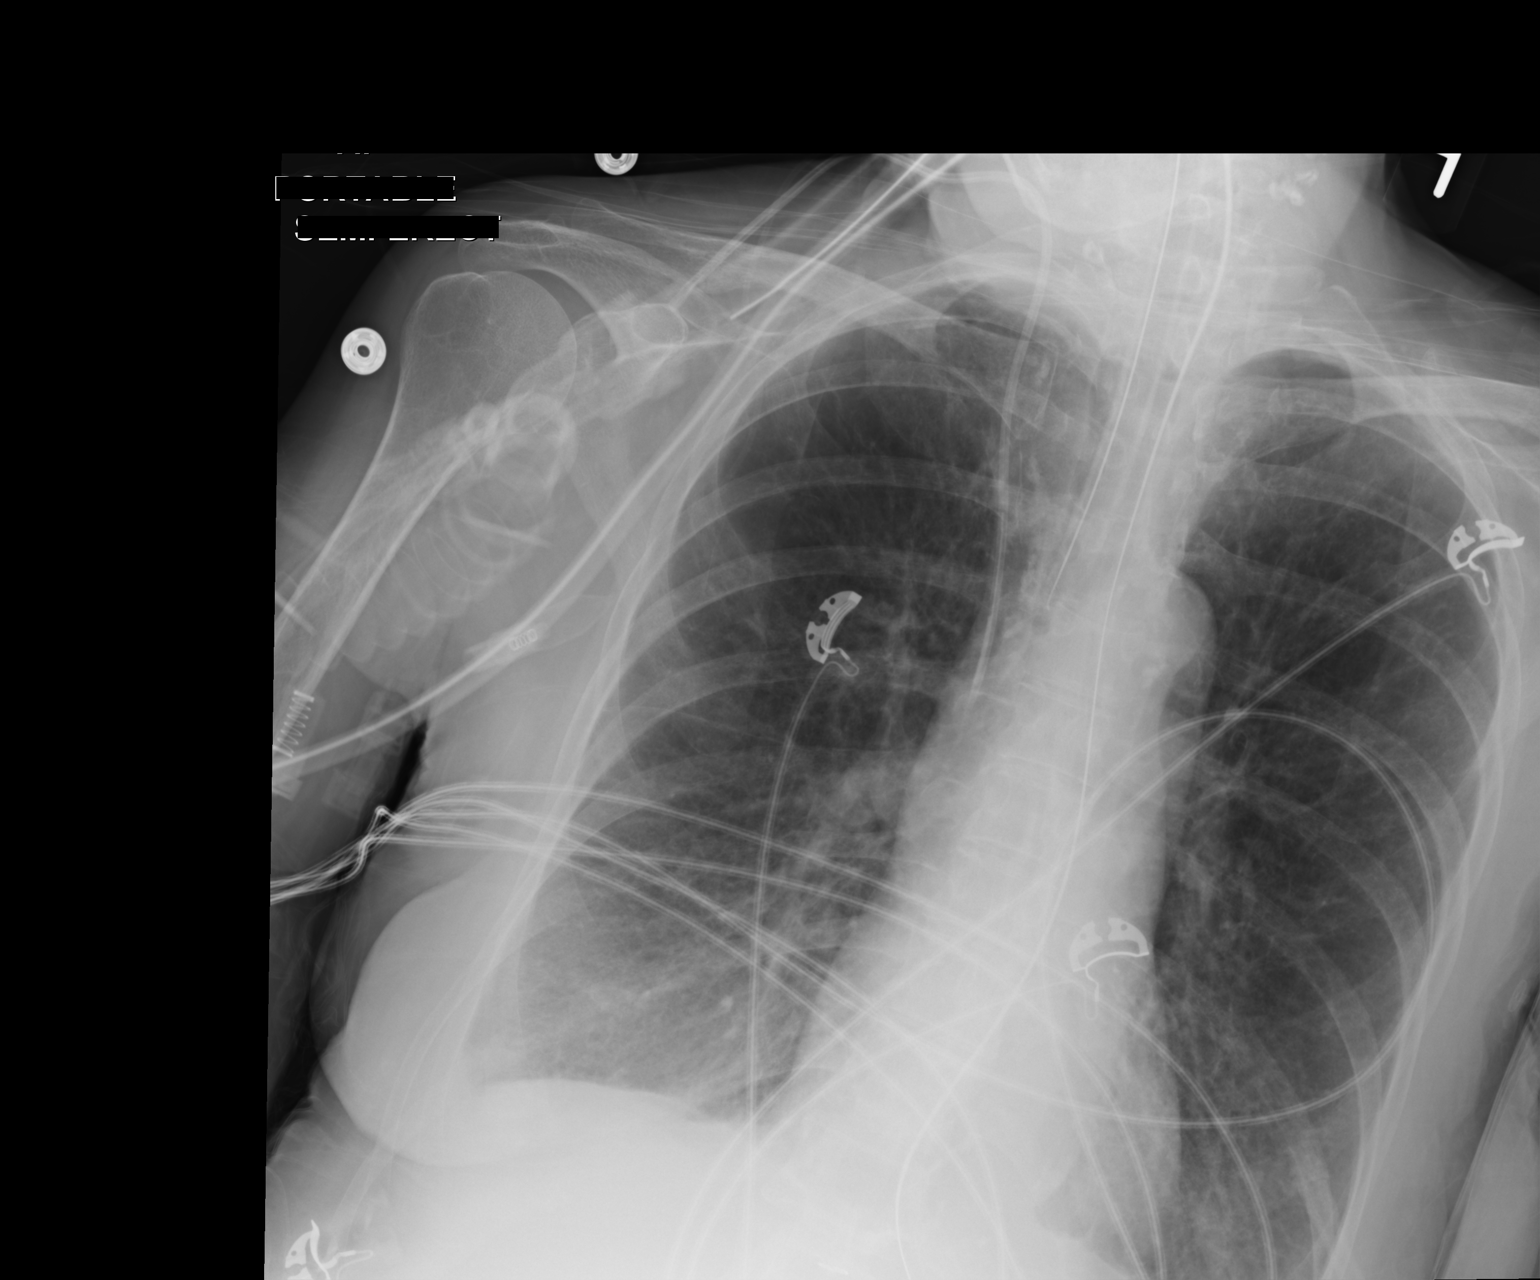

[AP (2 of 2)]
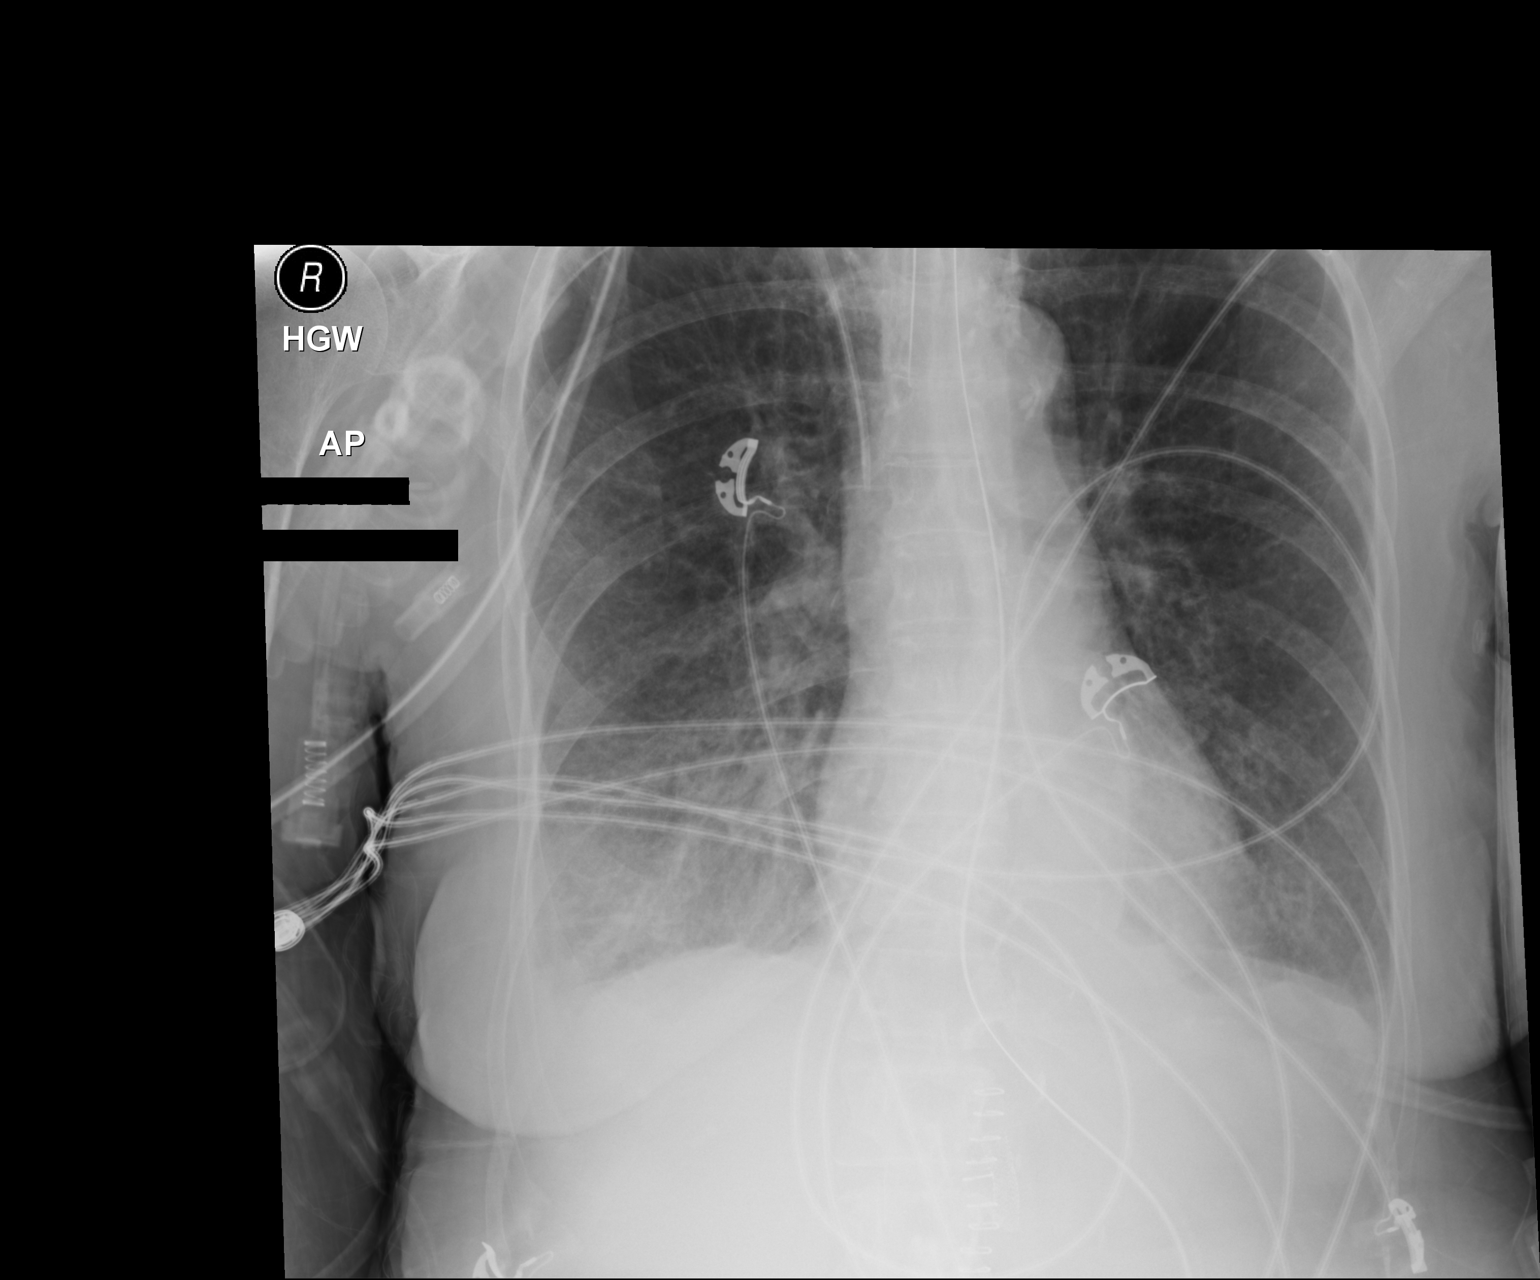

[2 of 2 positions shown; findings below may reference images not displayed]

FINDINGS: The endotracheal tube is 2.9 cm above the carina. The NG tube is in
stomach. The right IJ catheter is stable. The cardiac silhouette,
mediastinal and hilar contours are within normal limits. The lungs
demonstrate improved aeration with resolving infiltrates and
atelectasis. No pleural effusion or pneumothorax. Underlying
emphysematous changes suspected.
IMPRESSION: Stable support apparatus.

Improving lung aeration with resolving infiltrates and atelectasis.

## 2017-05-14 DIAGNOSIS — H353211 Exudative age-related macular degeneration, right eye, with active choroidal neovascularization: Secondary | ICD-10-CM | POA: Diagnosis not present

## 2017-06-16 DIAGNOSIS — J449 Chronic obstructive pulmonary disease, unspecified: Secondary | ICD-10-CM | POA: Diagnosis not present

## 2017-06-16 DIAGNOSIS — R918 Other nonspecific abnormal finding of lung field: Secondary | ICD-10-CM | POA: Diagnosis not present

## 2017-06-23 DIAGNOSIS — J449 Chronic obstructive pulmonary disease, unspecified: Secondary | ICD-10-CM | POA: Diagnosis not present

## 2017-06-23 DIAGNOSIS — R918 Other nonspecific abnormal finding of lung field: Secondary | ICD-10-CM | POA: Diagnosis not present

## 2017-06-25 DIAGNOSIS — Z6824 Body mass index (BMI) 24.0-24.9, adult: Secondary | ICD-10-CM | POA: Diagnosis not present

## 2017-06-25 DIAGNOSIS — M62830 Muscle spasm of back: Secondary | ICD-10-CM | POA: Diagnosis not present

## 2017-09-15 DIAGNOSIS — I70213 Atherosclerosis of native arteries of extremities with intermittent claudication, bilateral legs: Secondary | ICD-10-CM | POA: Diagnosis not present

## 2017-09-15 DIAGNOSIS — Z87891 Personal history of nicotine dependence: Secondary | ICD-10-CM | POA: Diagnosis not present

## 2017-09-15 DIAGNOSIS — K551 Chronic vascular disorders of intestine: Secondary | ICD-10-CM | POA: Diagnosis not present

## 2017-09-15 DIAGNOSIS — I998 Other disorder of circulatory system: Secondary | ICD-10-CM | POA: Diagnosis not present

## 2017-09-15 DIAGNOSIS — I771 Stricture of artery: Secondary | ICD-10-CM | POA: Diagnosis not present

## 2017-09-15 DIAGNOSIS — I10 Essential (primary) hypertension: Secondary | ICD-10-CM | POA: Diagnosis not present

## 2017-09-15 DIAGNOSIS — I6523 Occlusion and stenosis of bilateral carotid arteries: Secondary | ICD-10-CM | POA: Diagnosis not present

## 2017-09-15 DIAGNOSIS — I739 Peripheral vascular disease, unspecified: Secondary | ICD-10-CM | POA: Diagnosis not present

## 2017-10-20 DIAGNOSIS — G629 Polyneuropathy, unspecified: Secondary | ICD-10-CM | POA: Diagnosis not present

## 2017-10-20 DIAGNOSIS — Z0000 Encounter for general adult medical examination without abnormal findings: Secondary | ICD-10-CM | POA: Diagnosis not present

## 2017-10-20 DIAGNOSIS — Z79899 Other long term (current) drug therapy: Secondary | ICD-10-CM | POA: Diagnosis not present

## 2017-10-20 DIAGNOSIS — I739 Peripheral vascular disease, unspecified: Secondary | ICD-10-CM | POA: Diagnosis not present

## 2017-10-20 DIAGNOSIS — E785 Hyperlipidemia, unspecified: Secondary | ICD-10-CM | POA: Diagnosis not present

## 2017-10-20 DIAGNOSIS — J9611 Chronic respiratory failure with hypoxia: Secondary | ICD-10-CM | POA: Diagnosis not present

## 2018-01-07 DIAGNOSIS — N390 Urinary tract infection, site not specified: Secondary | ICD-10-CM | POA: Diagnosis not present

## 2018-01-21 DIAGNOSIS — N390 Urinary tract infection, site not specified: Secondary | ICD-10-CM | POA: Diagnosis not present

## 2018-03-02 DIAGNOSIS — R918 Other nonspecific abnormal finding of lung field: Secondary | ICD-10-CM | POA: Diagnosis not present

## 2018-03-02 DIAGNOSIS — J449 Chronic obstructive pulmonary disease, unspecified: Secondary | ICD-10-CM | POA: Diagnosis not present

## 2018-03-18 DIAGNOSIS — I771 Stricture of artery: Secondary | ICD-10-CM | POA: Diagnosis not present

## 2018-03-18 DIAGNOSIS — I6523 Occlusion and stenosis of bilateral carotid arteries: Secondary | ICD-10-CM | POA: Diagnosis not present

## 2018-03-18 DIAGNOSIS — I739 Peripheral vascular disease, unspecified: Secondary | ICD-10-CM | POA: Diagnosis not present

## 2018-04-02 DIAGNOSIS — I110 Hypertensive heart disease with heart failure: Secondary | ICD-10-CM | POA: Diagnosis not present

## 2018-04-02 DIAGNOSIS — J449 Chronic obstructive pulmonary disease, unspecified: Secondary | ICD-10-CM | POA: Diagnosis not present

## 2018-04-02 DIAGNOSIS — Z9981 Dependence on supplemental oxygen: Secondary | ICD-10-CM | POA: Diagnosis not present

## 2018-04-02 DIAGNOSIS — J9611 Chronic respiratory failure with hypoxia: Secondary | ICD-10-CM | POA: Diagnosis not present

## 2018-04-30 DIAGNOSIS — N390 Urinary tract infection, site not specified: Secondary | ICD-10-CM | POA: Diagnosis not present

## 2018-05-14 DIAGNOSIS — Z961 Presence of intraocular lens: Secondary | ICD-10-CM | POA: Diagnosis not present

## 2018-05-14 DIAGNOSIS — H353190 Nonexudative age-related macular degeneration, unspecified eye, stage unspecified: Secondary | ICD-10-CM | POA: Diagnosis not present

## 2018-07-15 ENCOUNTER — Other Ambulatory Visit: Payer: Self-pay

## 2018-09-28 DIAGNOSIS — R0902 Hypoxemia: Secondary | ICD-10-CM | POA: Diagnosis not present

## 2018-09-28 DIAGNOSIS — J449 Chronic obstructive pulmonary disease, unspecified: Secondary | ICD-10-CM | POA: Diagnosis not present

## 2018-10-21 DIAGNOSIS — Z9981 Dependence on supplemental oxygen: Secondary | ICD-10-CM | POA: Diagnosis not present

## 2018-10-21 DIAGNOSIS — J449 Chronic obstructive pulmonary disease, unspecified: Secondary | ICD-10-CM | POA: Diagnosis not present

## 2018-10-21 DIAGNOSIS — I5032 Chronic diastolic (congestive) heart failure: Secondary | ICD-10-CM | POA: Diagnosis not present

## 2018-10-21 DIAGNOSIS — J9611 Chronic respiratory failure with hypoxia: Secondary | ICD-10-CM | POA: Diagnosis not present

## 2018-12-23 DIAGNOSIS — J449 Chronic obstructive pulmonary disease, unspecified: Secondary | ICD-10-CM | POA: Diagnosis not present

## 2018-12-23 DIAGNOSIS — E785 Hyperlipidemia, unspecified: Secondary | ICD-10-CM | POA: Diagnosis not present

## 2018-12-23 DIAGNOSIS — F3340 Major depressive disorder, recurrent, in remission, unspecified: Secondary | ICD-10-CM | POA: Diagnosis not present

## 2019-01-21 DIAGNOSIS — N390 Urinary tract infection, site not specified: Secondary | ICD-10-CM | POA: Diagnosis not present

## 2019-01-22 DIAGNOSIS — I10 Essential (primary) hypertension: Secondary | ICD-10-CM | POA: Diagnosis not present

## 2019-01-22 DIAGNOSIS — I5032 Chronic diastolic (congestive) heart failure: Secondary | ICD-10-CM | POA: Diagnosis not present

## 2019-03-04 DIAGNOSIS — N390 Urinary tract infection, site not specified: Secondary | ICD-10-CM | POA: Diagnosis not present

## 2019-04-08 DIAGNOSIS — I70211 Atherosclerosis of native arteries of extremities with intermittent claudication, right leg: Secondary | ICD-10-CM | POA: Diagnosis not present

## 2019-04-12 DIAGNOSIS — I771 Stricture of artery: Secondary | ICD-10-CM | POA: Diagnosis not present

## 2019-04-12 DIAGNOSIS — Z87891 Personal history of nicotine dependence: Secondary | ICD-10-CM | POA: Diagnosis not present

## 2019-04-12 DIAGNOSIS — I70213 Atherosclerosis of native arteries of extremities with intermittent claudication, bilateral legs: Secondary | ICD-10-CM | POA: Diagnosis not present

## 2019-04-12 DIAGNOSIS — I10 Essential (primary) hypertension: Secondary | ICD-10-CM | POA: Diagnosis not present

## 2019-04-12 DIAGNOSIS — K551 Chronic vascular disorders of intestine: Secondary | ICD-10-CM | POA: Diagnosis not present

## 2019-04-12 DIAGNOSIS — I6523 Occlusion and stenosis of bilateral carotid arteries: Secondary | ICD-10-CM | POA: Diagnosis not present

## 2019-04-12 DIAGNOSIS — I739 Peripheral vascular disease, unspecified: Secondary | ICD-10-CM | POA: Diagnosis not present

## 2019-04-12 DIAGNOSIS — J449 Chronic obstructive pulmonary disease, unspecified: Secondary | ICD-10-CM | POA: Diagnosis not present

## 2019-04-21 DIAGNOSIS — K5909 Other constipation: Secondary | ICD-10-CM | POA: Diagnosis not present

## 2019-04-21 DIAGNOSIS — F17210 Nicotine dependence, cigarettes, uncomplicated: Secondary | ICD-10-CM | POA: Diagnosis not present

## 2019-04-21 DIAGNOSIS — B373 Candidiasis of vulva and vagina: Secondary | ICD-10-CM | POA: Diagnosis not present

## 2019-04-21 DIAGNOSIS — H353231 Exudative age-related macular degeneration, bilateral, with active choroidal neovascularization: Secondary | ICD-10-CM | POA: Diagnosis not present

## 2019-04-21 DIAGNOSIS — G629 Polyneuropathy, unspecified: Secondary | ICD-10-CM | POA: Diagnosis not present

## 2019-04-21 DIAGNOSIS — F17200 Nicotine dependence, unspecified, uncomplicated: Secondary | ICD-10-CM | POA: Diagnosis not present

## 2019-05-10 DIAGNOSIS — R0902 Hypoxemia: Secondary | ICD-10-CM | POA: Diagnosis not present

## 2019-05-10 DIAGNOSIS — J449 Chronic obstructive pulmonary disease, unspecified: Secondary | ICD-10-CM | POA: Diagnosis not present

## 2019-07-05 DIAGNOSIS — Z7401 Bed confinement status: Secondary | ICD-10-CM | POA: Diagnosis not present

## 2019-07-05 DIAGNOSIS — I10 Essential (primary) hypertension: Secondary | ICD-10-CM | POA: Diagnosis not present

## 2019-07-05 DIAGNOSIS — I2510 Atherosclerotic heart disease of native coronary artery without angina pectoris: Secondary | ICD-10-CM | POA: Diagnosis not present

## 2019-07-05 DIAGNOSIS — R0600 Dyspnea, unspecified: Secondary | ICD-10-CM | POA: Diagnosis not present

## 2019-07-05 DIAGNOSIS — R918 Other nonspecific abnormal finding of lung field: Secondary | ICD-10-CM | POA: Diagnosis not present

## 2019-07-05 DIAGNOSIS — J9811 Atelectasis: Secondary | ICD-10-CM | POA: Diagnosis not present

## 2019-07-05 DIAGNOSIS — R9431 Abnormal electrocardiogram [ECG] [EKG]: Secondary | ICD-10-CM | POA: Diagnosis not present

## 2019-07-05 DIAGNOSIS — R000 Tachycardia, unspecified: Secondary | ICD-10-CM | POA: Diagnosis not present

## 2019-07-05 DIAGNOSIS — I700 Atherosclerosis of aorta: Secondary | ICD-10-CM | POA: Diagnosis not present

## 2019-07-05 DIAGNOSIS — J439 Emphysema, unspecified: Secondary | ICD-10-CM | POA: Diagnosis not present

## 2019-07-05 DIAGNOSIS — Z87891 Personal history of nicotine dependence: Secondary | ICD-10-CM | POA: Diagnosis not present

## 2019-07-05 DIAGNOSIS — K449 Diaphragmatic hernia without obstruction or gangrene: Secondary | ICD-10-CM | POA: Diagnosis not present

## 2019-07-05 DIAGNOSIS — I6502 Occlusion and stenosis of left vertebral artery: Secondary | ICD-10-CM | POA: Diagnosis not present

## 2019-07-05 DIAGNOSIS — M2550 Pain in unspecified joint: Secondary | ICD-10-CM | POA: Diagnosis not present

## 2019-07-05 DIAGNOSIS — R0602 Shortness of breath: Secondary | ICD-10-CM | POA: Diagnosis not present

## 2019-07-06 DIAGNOSIS — R000 Tachycardia, unspecified: Secondary | ICD-10-CM | POA: Diagnosis not present

## 2019-07-11 DIAGNOSIS — R0902 Hypoxemia: Secondary | ICD-10-CM | POA: Diagnosis not present

## 2019-07-11 DIAGNOSIS — R918 Other nonspecific abnormal finding of lung field: Secondary | ICD-10-CM | POA: Diagnosis not present

## 2019-07-11 DIAGNOSIS — J441 Chronic obstructive pulmonary disease with (acute) exacerbation: Secondary | ICD-10-CM | POA: Diagnosis not present

## 2019-07-15 DIAGNOSIS — J449 Chronic obstructive pulmonary disease, unspecified: Secondary | ICD-10-CM | POA: Diagnosis not present

## 2019-07-15 DIAGNOSIS — I479 Paroxysmal tachycardia, unspecified: Secondary | ICD-10-CM | POA: Diagnosis not present

## 2019-07-15 DIAGNOSIS — J9611 Chronic respiratory failure with hypoxia: Secondary | ICD-10-CM | POA: Diagnosis not present

## 2019-07-15 DIAGNOSIS — Z9981 Dependence on supplemental oxygen: Secondary | ICD-10-CM | POA: Diagnosis not present

## 2019-07-26 ENCOUNTER — Inpatient Hospital Stay (HOSPITAL_COMMUNITY)
Admission: AD | Admit: 2019-07-26 | Discharge: 2019-08-09 | DRG: 377 | Disposition: A | Discharge status: Skilled Nursing Facility | Payer: Medicare Other | Source: Other Acute Inpatient Hospital | Attending: Family Medicine | Admitting: Family Medicine

## 2019-07-26 ENCOUNTER — Inpatient Hospital Stay (HOSPITAL_COMMUNITY): Payer: Medicare Other

## 2019-07-26 DIAGNOSIS — I248 Other forms of acute ischemic heart disease: Secondary | ICD-10-CM

## 2019-07-26 DIAGNOSIS — D72829 Elevated white blood cell count, unspecified: Secondary | ICD-10-CM

## 2019-07-26 DIAGNOSIS — I959 Hypotension, unspecified: Secondary | ICD-10-CM | POA: Diagnosis not present

## 2019-07-26 DIAGNOSIS — Z9981 Dependence on supplemental oxygen: Secondary | ICD-10-CM

## 2019-07-26 DIAGNOSIS — J9611 Chronic respiratory failure with hypoxia: Secondary | ICD-10-CM | POA: Diagnosis present

## 2019-07-26 DIAGNOSIS — Z886 Allergy status to analgesic agent status: Secondary | ICD-10-CM | POA: Diagnosis not present

## 2019-07-26 DIAGNOSIS — K921 Melena: Secondary | ICD-10-CM | POA: Diagnosis not present

## 2019-07-26 DIAGNOSIS — Z91013 Allergy to seafood: Secondary | ICD-10-CM

## 2019-07-26 DIAGNOSIS — D696 Thrombocytopenia, unspecified: Secondary | ICD-10-CM | POA: Diagnosis not present

## 2019-07-26 DIAGNOSIS — J449 Chronic obstructive pulmonary disease, unspecified: Secondary | ICD-10-CM | POA: Diagnosis not present

## 2019-07-26 DIAGNOSIS — I2510 Atherosclerotic heart disease of native coronary artery without angina pectoris: Secondary | ICD-10-CM | POA: Diagnosis present

## 2019-07-26 DIAGNOSIS — Z66 Do not resuscitate: Secondary | ICD-10-CM | POA: Diagnosis present

## 2019-07-26 DIAGNOSIS — Z7951 Long term (current) use of inhaled steroids: Secondary | ICD-10-CM

## 2019-07-26 DIAGNOSIS — K264 Chronic or unspecified duodenal ulcer with hemorrhage: Secondary | ICD-10-CM | POA: Diagnosis present

## 2019-07-26 DIAGNOSIS — D62 Acute posthemorrhagic anemia: Secondary | ICD-10-CM

## 2019-07-26 DIAGNOSIS — K269 Duodenal ulcer, unspecified as acute or chronic, without hemorrhage or perforation: Secondary | ICD-10-CM | POA: Diagnosis not present

## 2019-07-26 DIAGNOSIS — I10 Essential (primary) hypertension: Secondary | ICD-10-CM | POA: Diagnosis not present

## 2019-07-26 DIAGNOSIS — U071 COVID-19: Secondary | ICD-10-CM

## 2019-07-26 DIAGNOSIS — Z880 Allergy status to penicillin: Secondary | ICD-10-CM

## 2019-07-26 DIAGNOSIS — I739 Peripheral vascular disease, unspecified: Secondary | ICD-10-CM | POA: Diagnosis not present

## 2019-07-26 DIAGNOSIS — J439 Emphysema, unspecified: Secondary | ICD-10-CM | POA: Diagnosis not present

## 2019-07-26 DIAGNOSIS — E875 Hyperkalemia: Secondary | ICD-10-CM | POA: Diagnosis not present

## 2019-07-26 DIAGNOSIS — K922 Gastrointestinal hemorrhage, unspecified: Secondary | ICD-10-CM | POA: Diagnosis not present

## 2019-07-26 DIAGNOSIS — R578 Other shock: Secondary | ICD-10-CM | POA: Diagnosis present

## 2019-07-26 DIAGNOSIS — R0602 Shortness of breath: Secondary | ICD-10-CM | POA: Diagnosis not present

## 2019-07-26 DIAGNOSIS — K254 Chronic or unspecified gastric ulcer with hemorrhage: Principal | ICD-10-CM | POA: Diagnosis present

## 2019-07-26 DIAGNOSIS — R000 Tachycardia, unspecified: Secondary | ICD-10-CM | POA: Diagnosis not present

## 2019-07-26 DIAGNOSIS — D500 Iron deficiency anemia secondary to blood loss (chronic): Secondary | ICD-10-CM | POA: Diagnosis not present

## 2019-07-26 DIAGNOSIS — R0902 Hypoxemia: Secondary | ICD-10-CM | POA: Diagnosis not present

## 2019-07-26 DIAGNOSIS — Z452 Encounter for adjustment and management of vascular access device: Secondary | ICD-10-CM | POA: Diagnosis not present

## 2019-07-26 DIAGNOSIS — I700 Atherosclerosis of aorta: Secondary | ICD-10-CM | POA: Diagnosis not present

## 2019-07-26 DIAGNOSIS — R58 Hemorrhage, not elsewhere classified: Secondary | ICD-10-CM

## 2019-07-26 DIAGNOSIS — Z87891 Personal history of nicotine dependence: Secondary | ICD-10-CM | POA: Diagnosis not present

## 2019-07-26 DIAGNOSIS — K920 Hematemesis: Secondary | ICD-10-CM | POA: Diagnosis not present

## 2019-07-26 DIAGNOSIS — F17200 Nicotine dependence, unspecified, uncomplicated: Secondary | ICD-10-CM | POA: Diagnosis not present

## 2019-07-26 DIAGNOSIS — Z885 Allergy status to narcotic agent status: Secondary | ICD-10-CM | POA: Diagnosis not present

## 2019-07-26 DIAGNOSIS — R42 Dizziness and giddiness: Secondary | ICD-10-CM | POA: Diagnosis not present

## 2019-07-26 DIAGNOSIS — D649 Anemia, unspecified: Secondary | ICD-10-CM | POA: Diagnosis not present

## 2019-07-26 HISTORY — DX: Gastrointestinal hemorrhage, unspecified: K92.2

## 2019-07-26 MED ORDER — ALBUTEROL SULFATE HFA 108 (90 BASE) MCG/ACT IN AERS
2.0000 | INHALATION_SPRAY | RESPIRATORY_TRACT | Status: DC | PRN
  Filled 2019-07-26: qty 6.7

## 2019-07-26 MED ORDER — PANTOPRAZOLE SODIUM 40 MG IV SOLR
40.0000 mg | Freq: Two times a day (BID) | INTRAVENOUS | Status: DC
  Administered 2019-07-27 – 2019-07-30 (×8): 40 mg via INTRAVENOUS
  Filled 2019-07-26 (×8): qty 40

## 2019-07-26 MED ORDER — UMECLIDINIUM BROMIDE 62.5 MCG/INH IN AEPB
1.0000 | INHALATION_SPRAY | Freq: Every day | RESPIRATORY_TRACT | Status: DC
  Administered 2019-07-27 – 2019-08-09 (×14): 1 via RESPIRATORY_TRACT
  Filled 2019-07-26 (×4): qty 7

## 2019-07-26 MED ORDER — CHLORHEXIDINE GLUCONATE CLOTH 2 % EX PADS
6.0000 | MEDICATED_PAD | Freq: Every day | CUTANEOUS | Status: DC
  Administered 2019-07-27: 6 via TOPICAL

## 2019-07-26 MED ORDER — SODIUM CHLORIDE 0.9 % IV SOLN
100.0000 mg | INTRAVENOUS | Status: DC
  Administered 2019-07-27 – 2019-07-28 (×2): 100 mg via INTRAVENOUS
  Filled 2019-07-26: qty 20
  Filled 2019-07-26: qty 100

## 2019-07-26 MED ORDER — ACETAMINOPHEN 325 MG PO TABS
650.0000 mg | ORAL_TABLET | ORAL | Status: DC | PRN
  Administered 2019-07-27 – 2019-08-09 (×26): 650 mg via ORAL
  Filled 2019-07-26 (×26): qty 2

## 2019-07-26 MED ORDER — SODIUM CHLORIDE 0.9 % IV SOLN
200.0000 mg | Freq: Once | INTRAVENOUS | Status: AC
  Administered 2019-07-27: 200 mg via INTRAVENOUS
  Filled 2019-07-26: qty 40

## 2019-07-26 MED ORDER — GABAPENTIN 300 MG PO CAPS
900.0000 mg | ORAL_CAPSULE | Freq: Every day | ORAL | Status: DC
  Administered 2019-07-27 – 2019-08-08 (×13): 900 mg via ORAL
  Filled 2019-07-26 (×16): qty 3

## 2019-07-26 MED ORDER — MOMETASONE FURO-FORMOTEROL FUM 200-5 MCG/ACT IN AERO
2.0000 | INHALATION_SPRAY | Freq: Two times a day (BID) | RESPIRATORY_TRACT | Status: DC
  Administered 2019-07-27 – 2019-08-09 (×27): 2 via RESPIRATORY_TRACT
  Filled 2019-07-26 (×2): qty 8.8

## 2019-07-26 MED ORDER — ONDANSETRON HCL 4 MG/2ML IJ SOLN
4.0000 mg | Freq: Four times a day (QID) | INTRAMUSCULAR | Status: DC | PRN
  Administered 2019-07-30 – 2019-07-31 (×2): 4 mg via INTRAVENOUS
  Filled 2019-07-26 (×2): qty 2

## 2019-07-26 NOTE — H&P (Signed)
NAME:  Kelly Phillips, MRN:  [id number], DOB:  [birth year], LOS: 0 ADMISSION DATE:  07/26/2019, CONSULTATION DATE:  12/1 REFERRING MD:  Dr. Rona Ravens EDP, CHIEF COMPLAINT:  GIB, COVID   Brief History   73 year old female admitted [facility] ED 12/1 with acute GI bleed and incidentally discovered to be COVID positive.  History of present illness   73 year old female with PMH as below, which is significant for former smoker, 3 vessel CAD and COPD on 3 liters. She lives at home by herself, with family and caregivers stopping by frequently to care for her. She has not left the house much since [place] hit. She presented to [facility] ED 12/1 with complaints of generalized weakness and fatigue for a couple weeks. She had a near syncopal episode while attempting to use the restroom and presented to ED. On questioning, she did describe several dark watery bowel movements. She was felt to be pale on exam as well. Hemoglobin discovered to be 5.4. and she was transfused 2 units. ED course otherwise significant for short run on tachyarrhythmia and her COVID swab came back positive. No clear risk factors or known sick contacts. Describes no COVID specific symptoms in the last month. Also ECG showed some inferolateral ST depression. She was transferred to [facility] ICU for further care and GI consultation. PCCM to admit.   Past Medical History   has a past medical history of COPD (chronic obstructive pulmonary disease), PAD (peripheral artery disease), and SMA stenosis.   has a past surgical history that includes Aorto-femoral Bypass Graft.  Significant Hospital Events   12/1 admit to [facility] ICU  Consults:    Procedures:  CVL [facility] ED 12/1 >  Significant Diagnostic Tests:  CTA chest/abd/pelv 12-1 > No acute pathology  Micro Data:  Blood culture 12/1 > Urine culture 12/1 >  Antimicrobials:     Interim history/subjective:    Objective   Height 5\' 2"  (1.575 m), weight 58.9  kg.       No intake or output data in the 24 hours ending 07/26/19 2326 Filed Weights   07/26/19 2315  Weight: 58.9 kg    Examination: General: elderly appearing female of normal body habitus in NAD HENT: [place]/AT, PERRL, no appreciable JVD Lungs: Clear bilateral breath sounds. No wheeze Cardiovascular: RRR, no MRG Abdomen: Soft, non-tender, non-distended Extremities: No acute deformity or ROM limitation. No edema.  Neuro: Alert, oriented, non-focal  Resolved Hospital Problem list     Assessment & Plan:   Upper GI bleed: no known history of cirrhosis or varices. She has recently been taking Goody's more frequently and has used ibuprofen once or twice. Says she takes her heartburn medication as prescribed. Drinks about four glasses of wine per week.  - Protonix BID - Plan for GI consult in AM - SCDs only  Acute blood loss anemia secondary to above. Hemoglobin 5.4 on admission.  - s/p 2 units PRBC in ED - Serial H&H  COVID: PCR positive from [facility] 12/1. She is SOB, but in the setting of anemia hard to say this is due to COVID. Has improved with transfusion.  - Support O2 PRN - Consider remdesivir  - Trend ferritin, d-dimer, CRP - Airborne and contact isolation.   ST depression: inferolateral depression on EKG from [place] ED in the setting of anemia. EKG on admission here is improved.  - Trend troponin  COPD without exacerbation - Dulera in lieu of nebs in COVID positive  patient.   Best practice:  Diet: Sips Pain/Anxiety/Delirium protocol (if indicated): NA VAP protocol (if indicated): NA DVT prophylaxis: SCD GI prophylaxis: PPI BID Glucose control: NA Mobility: BR Code Status: DNR Family Communication: Patient updated. Son is in [place]. He is decision maker should she not be able to.  Disposition: ICU  Labs   CBC: No results for input(s): WBC, NEUTROABS, HGB, HCT, MCV, PLT in the last 168 hours.  Basic Metabolic Panel: No results for input(s): NA, K, CL,  CO2, GLUCOSE, BUN, CREATININE, CALCIUM, MG, PHOS in the last 168 hours. GFR: CrCl cannot be calculated (Patient's most recent lab result is older than the maximum 21 days allowed.). No results for input(s): PROCALCITON, WBC, LATICACIDVEN in the last 168 hours.  Liver Function Tests: No results for input(s): AST, ALT, ALKPHOS, BILITOT, PROT, ALBUMIN in the last 168 hours. No results for input(s): LIPASE, AMYLASE in the last 168 hours. No results for input(s): AMMONIA in the last 168 hours.  ABG    Component Value Date/Time   PHART 7.548 (H) 05/11/2015 0740   PCO2ART 37.6 05/11/2015 0740   PO2ART 91.6 05/11/2015 0740   HCO3 32.7 (H) 05/11/2015 0740   TCO2 33.8 05/11/2015 0740   O2SAT 97.6 05/11/2015 0740     Coagulation Profile: No results for input(s): INR, PROTIME in the last 168 hours.  Cardiac Enzymes: No results for input(s): CKTOTAL, CKMB, CKMBINDEX, TROPONINI in the last 168 hours.  HbA1C: Hgb A1c MFr Bld  Date/Time Value Ref Range Status  05/17/2015 05:58 AM 6.1 (H) 4.8 - 5.6 % Final    Comment:    (NOTE)         Pre-diabetes: 5.7 - 6.4         Diabetes: >6.4         Glycemic control for adults with diabetes: <7.0     CBG: No results for input(s): GLUCAP in the last 168 hours.  Review of Systems:   Bolds are positive  Constitutional: weight loss, gain, night sweats, Fevers, chills, fatigue .  HEENT: headaches, Sore throat, sneezing, nasal congestion, post nasal drip, Difficulty swallowing, Tooth/dental problems, visual complaints visual changes, ear ache CV:  chest pain, radiates:,Orthopnea, PND, swelling in lower extremities, dizziness, palpitations, syncope.  GI  heartburn, indigestion, abdominal pain, nausea, vomiting, diarrhea, change in bowel habits, loss of appetite, bloody stools.  Resp: cough, productive: , hemoptysis, dyspnea, chest pain, pleuritic.  Skin: rash or itching or icterus GU: dysuria, change in color of urine, urgency or frequency. flank  pain, hematuria  MS: joint pain or swelling. decreased range of motion  Psych: change in mood or affect. depression or anxiety.  Neuro: difficulty with speech, weakness, numbness, ataxia    Past Medical History  She,  has a past medical history of COPD (chronic obstructive pulmonary disease), PAD (peripheral artery disease), and SMA stenosis.   Surgical History      Social History   reports that she has been smoking. She does not have any smokeless tobacco history on file.   Family History   Her family history is not on file.   Allergies Not on File   Home Medications  Prior to Admission medications   Not on File     Critical care time:      Georgann Housekeeper, AGACNP-BC [place] for personal pager PCCM on call pager [phone]  07/27/2019 1:53 AM

## 2019-07-27 ENCOUNTER — Other Ambulatory Visit: Payer: Self-pay

## 2019-07-27 DIAGNOSIS — U071 COVID-19: Secondary | ICD-10-CM | POA: Diagnosis not present

## 2019-07-27 DIAGNOSIS — K922 Gastrointestinal hemorrhage, unspecified: Secondary | ICD-10-CM | POA: Diagnosis not present

## 2019-07-27 LAB — BASIC METABOLIC PANEL
Anion gap: 8 (ref 5–15)
BUN: 28 mg/dL — ABNORMAL HIGH (ref 8–23)
CO2: 27 mmol/L (ref 22–32)
Calcium: 8.5 mg/dL — ABNORMAL LOW (ref 8.9–10.3)
Chloride: 107 mmol/L (ref 98–111)
Creatinine, Ser: 0.7 mg/dL (ref 0.44–1.00)
GFR calc Af Amer: >60 mL/min (ref >60)
GFR calc non Af Amer: >60 mL/min (ref >60)
Glucose, Bld: 91 mg/dL (ref 70–99)
Potassium: 3.9 mmol/L (ref 3.5–5.1)
Sodium: 142 mmol/L (ref 135–145)

## 2019-07-27 LAB — MAGNESIUM
Magnesium: 1.8 mg/dL (ref 1.7–2.4)
Magnesium: 1.8 mg/dL (ref 1.7–2.4)

## 2019-07-27 LAB — COMPREHENSIVE METABOLIC PANEL
ALT: 15 U/L (ref 0–44)
AST: 21 U/L (ref 15–41)
Albumin: 2.9 g/dL — ABNORMAL LOW (ref 3.5–5.0)
Alkaline Phosphatase: 48 U/L (ref 38–126)
Anion gap: 9 (ref 5–15)
BUN: 34 mg/dL — ABNORMAL HIGH (ref 8–23)
CO2: 26 mmol/L (ref 22–32)
Calcium: 8.8 mg/dL — ABNORMAL LOW (ref 8.9–10.3)
Chloride: 106 mmol/L (ref 98–111)
Creatinine, Ser: 0.78 mg/dL (ref 0.44–1.00)
GFR calc Af Amer: >60 mL/min (ref >60)
GFR calc non Af Amer: >60 mL/min (ref >60)
Glucose, Bld: 110 mg/dL — ABNORMAL HIGH (ref 70–99)
Potassium: 4.1 mmol/L (ref 3.5–5.1)
Sodium: 141 mmol/L (ref 135–145)
Total Bilirubin: 0.8 mg/dL (ref 0.3–1.2)
Total Protein: 5.2 g/dL — ABNORMAL LOW (ref 6.5–8.1)

## 2019-07-27 LAB — CBC
HCT: 26.1 % — ABNORMAL LOW (ref 36.0–46.0)
HCT: 24.9 % — ABNORMAL LOW (ref 36.0–46.0)
Hemoglobin: 8.7 g/dL — ABNORMAL LOW (ref 12.0–15.0)
Hemoglobin: 8.4 g/dL — ABNORMAL LOW (ref 12.0–15.0)
MCH: 31.8 pg (ref 26.0–34.0)
MCH: 31.7 pg (ref 26.0–34.0)
MCHC: 33.3 g/dL (ref 30.0–36.0)
MCHC: 33.7 g/dL (ref 30.0–36.0)
MCV: 95.3 fL (ref 80.0–100.0)
MCV: 94 fL (ref 80.0–100.0)
Platelets: 109 10*3/uL — ABNORMAL LOW (ref 150–400)
Platelets: 118 10*3/uL — ABNORMAL LOW (ref 150–400)
RBC: 2.74 MIL/uL — ABNORMAL LOW (ref 3.87–5.11)
RBC: 2.65 MIL/uL — ABNORMAL LOW (ref 3.87–5.11)
RDW: 16.3 % — ABNORMAL HIGH (ref 11.5–15.5)
RDW: 16.3 % — ABNORMAL HIGH (ref 11.5–15.5)
WBC: 10.5 10*3/uL (ref 4.0–10.5)
WBC: 10.2 10*3/uL (ref 4.0–10.5)
nRBC: 1.6 % — ABNORMAL HIGH (ref 0.0–0.2)
nRBC: 1.7 % — ABNORMAL HIGH (ref 0.0–0.2)

## 2019-07-27 LAB — TROPONIN I (HIGH SENSITIVITY)
Troponin I (High Sensitivity): 15 ng/L (ref <18)
Troponin I (High Sensitivity): 14 ng/L (ref <18)

## 2019-07-27 LAB — LACTIC ACID, PLASMA
Lactic Acid, Venous: 0.9 mmol/L (ref 0.5–1.9)
Lactic Acid, Venous: 0.9 mmol/L (ref 0.5–1.9)

## 2019-07-27 LAB — GLUCOSE, CAPILLARY: Glucose-Capillary: 114 mg/dL — ABNORMAL HIGH (ref 70–99)

## 2019-07-27 LAB — CBC WITH DIFFERENTIAL/PLATELET
Abs Immature Granulocytes: 0.46 10*3/uL — ABNORMAL HIGH (ref 0.00–0.07)
Basophils Absolute: 0.1 10*3/uL (ref 0.0–0.1)
Basophils Relative: 0 %
Eosinophils Absolute: 0.1 10*3/uL (ref 0.0–0.5)
Eosinophils Relative: 1 %
HCT: 30.7 % — ABNORMAL LOW (ref 36.0–46.0)
Hemoglobin: 10.3 g/dL — ABNORMAL LOW (ref 12.0–15.0)
Immature Granulocytes: 3 %
Lymphocytes Relative: 5 %
Lymphs Abs: 0.8 10*3/uL (ref 0.7–4.0)
MCH: 31.9 pg (ref 26.0–34.0)
MCHC: 33.6 g/dL (ref 30.0–36.0)
MCV: 95 fL (ref 80.0–100.0)
Monocytes Absolute: 1.2 10*3/uL — ABNORMAL HIGH (ref 0.1–1.0)
Monocytes Relative: 8 %
Neutro Abs: 12.6 10*3/uL — ABNORMAL HIGH (ref 1.7–7.7)
Neutrophils Relative %: 83 %
Platelets: 142 10*3/uL — ABNORMAL LOW (ref 150–400)
RBC: 3.23 MIL/uL — ABNORMAL LOW (ref 3.87–5.11)
RDW: 15.5 % (ref 11.5–15.5)
WBC: 15.2 10*3/uL — ABNORMAL HIGH (ref 4.0–10.5)
nRBC: 1.2 % — ABNORMAL HIGH (ref 0.0–0.2)

## 2019-07-27 LAB — D-DIMER, QUANTITATIVE
D-Dimer, Quant: 1 ug/mL-FEU — ABNORMAL HIGH (ref 0.00–0.50)
D-Dimer, Quant: 1.19 ug/mL-FEU — ABNORMAL HIGH (ref 0.00–0.50)

## 2019-07-27 LAB — ABO/RH: ABO/RH(D): O POS

## 2019-07-27 LAB — PHOSPHORUS
Phosphorus: 4 mg/dL (ref 2.5–4.6)
Phosphorus: 4.1 mg/dL (ref 2.5–4.6)

## 2019-07-27 LAB — APTT: aPTT: 23 seconds — ABNORMAL LOW (ref 24–36)

## 2019-07-27 LAB — MRSA PCR SCREENING: MRSA by PCR: NEGATIVE

## 2019-07-27 LAB — PROTIME-INR
INR: 0.9 (ref 0.8–1.2)
Prothrombin Time: 12.3 seconds (ref 11.4–15.2)

## 2019-07-27 NOTE — Progress Notes (Signed)
NAME:  Kelly Phillips, MRN:  [id number], DOB:  [birth year], LOS: 1 ADMISSION DATE:  07/26/2019, CONSULTATION DATE:  12/1 REFERRING MD:  Dr. Rona Ravens EDP, CHIEF COMPLAINT:  GIB, COVID   Brief History   73 year old female admitted to [facility] ED 12/1 with acute GI bleed and incidentally discovered to be COVID positive.  History of present illness   73 year old female with PMH as below, which is significant for former smoker, 3 vessel CAD and COPD on 3 liters. She lives at home by herself, with family and caregivers stopping by frequently to care for her. She has not left the house much since [place] hit. She presented to [facility] ED 12/1 with complaints of generalized weakness and fatigue for a couple weeks. She had a near syncopal episode while attempting to use the restroom and presented to ED. On questioning, she did describe several dark watery bowel movements. She was felt to be pale on exam as well. Hemoglobin discovered to be 5.4. and she was transfused 2 units. ED course otherwise significant for short run on tachyarrhythmia and her COVID swab came back positive. No clear risk factors or known sick contacts. Describes no COVID specific symptoms in the last month. Also ECG showed some inferolateral ST depression. She was transferred to [facility] ICU for further care and GI consultation. PCCM to admit.   Past Medical History   has a past medical history of COPD (chronic obstructive pulmonary disease), PAD (peripheral artery disease), and SMA stenosis.   has a past surgical history that includes Aorto-femoral Bypass Graft.  Significant Hospital Events   12/1 admit to [facility] ICU  Consults:    Procedures:  CV[facility] ED 12/1 >  Significant Diagnostic Tests:  CTA chest/abd/pelv 12-1 > No acute pathology  Micro Data:  Blood culture 12/1 > Urine culture 12/1 >  Antimicrobials:     Interim history/subjective:   No chest pain or dyspnea  Objective   Blood  pressure 90/64, pulse 97, temperature 98 F (36.7 C), temperature source Axillary, resp. rate (!) 22, height 5\' 2"  (1.575 m), weight 58.9 kg, SpO2 100 %.        Intake/Output Summary (Last 24 hours) at 07/27/2019 1635 Last data filed at 07/27/2019 1400 Gross per 24 hour  Intake -  Output 850 ml  Net -850 ml   Filed Weights   07/26/19 2315 07/27/19 0456  Weight: 58.9 kg 58.9 kg    Examination: Unchanged from admission General: elderly appearing female of normal body habitus in NAD HENT: [place]/AT, PERRL, no appreciable JVD Lungs: Clear bilateral breath sounds. No wheeze Cardiovascular: RRR, no MRG Abdomen: Soft, non-tender, non-distended Extremities: No acute deformity or ROM limitation. No edema.  Neuro: Alert, oriented, non-focal  Resolved Hospital Problem list     Assessment & Plan:   Upper GI bleed: no known history of cirrhosis or varices. She has recently been taking Goody's more frequently and has used ibuprofen once or twice. Says she takes her heartburn medication as prescribed. Drinks about four glasses of wine per week.  - Protonix BID -Seen by GI and EGD deferred -Advance to clear liquids  Acute blood loss anemia secondary to above. Hemoglobin 5.4 on admission.  - s/p 2 units PRBC in ED - Serial H&H appears stable  COVID: PCR positive from [facility] 12/1.  Chest x-ray is clear - Support O2 PRN - Ct remdesivir but hold off steroids due to possibility of ulcer and no hypoxia -  Trend ferritin, d-dimer, CRP - Airborne and contact isolation.  -Consider repeat testing  ST depression: inferolateral depression on EKG from [facility] ED in the setting of anemia. EKG on admission here is improved.  -  troponin neg reassuring  COPD /chronic hypoxic respiratory failure on 3 L oxygen - Dulera in lieu of nebs in COVID positive patient.   Best practice:  Diet: Sips Pain/Anxiety/Delirium protocol (if indicated): NA VAP protocol (if indicated): NA DVT prophylaxis: SCD GI  prophylaxis: PPI BID Glucose control: NA Mobility: BR Code Status: DNR Family Communication: Patient updated. Son is in [place]. He is decision maker should she not be able to.  Disposition: ICU  Transfer to stepdown unit and to triad 12/3  Kara Mead MD. [facility]. [facility]  If no response to pager , please call [phone]    07/27/2019 4:35 PM

## 2019-07-27 NOTE — Progress Notes (Signed)
PCCM to [facility] transfer:  Patient with h/o SMA stenosis; PAD; and COPD who was transferred from [facility] to [facility] on 12/1 with ABLA in the setting of UGI bleeding.  Hgb decreased from 16.9 on 11/10 to 5.4 on 12/1.  She was incidentally found to be COVID-19 +.  Hemodynamically stable, given 2 units blood.  Does not appear to have symptoms or CXR findings; repeating COVID testing.  Given Remdesivir, needs to complete all 5 days per PCCM.  Needs ongoing monitoring for the GI bleeding since she is not a candidate for procedures given COVID infection.  TRH will assume care on 12/3.  Carlyon Shadow, M.D.

## 2019-07-27 NOTE — Consult Note (Signed)
[facility] Consultation Note  Referring Provider: Pulmonary Critical Care medicine Primary Care Physician:  No primary care provider on file.  Reason for Consultation:  Gi bleeding  HPI: Kelly Phillips is a 73 y.o. female transferred from outside facility with anemia and pre-syncope.  Reports of dark stools over the past couple weeks.  Per discussion with patient and the nursing staff, patient has had no blood in stool since her admission about 12 hours ago.  Drop in Hgb several grams, down to nadir of 5, with blood transfusions given and current Hgb 8.7.  Hgb 9.5 in 2016.  She denies prior endoscopy or colonoscopy.  No abdominal pain or nausea/vomiting or hematemesis.  Feels better after transfusion.  Reports prior history of vascular stenting from blockage of her SMA.  Denies any aspirin or NSAIDs or anticoagulants.   Past Medical History:  Diagnosis Date  . COPD (chronic obstructive pulmonary disease)   . PAD (peripheral artery disease)   . SMA stenosis      Prior to Admission medications   Medication Sig Start Date End Date Taking? Authorizing Provider  albuterol (VENTOLIN HFA) 108 (90 Base) MCG/ACT inhaler Inhale into the lungs. 07/10/16  Yes [provider]  amLODipine-benazepril (LOTREL) 10-20 MG capsule TAKE ONE CAPSULE EVERY DAY 10/10/15  Yes [provider]  furosemide (LASIX) 20 MG tablet Take by mouth. 11/22/15  Yes [provider]  gabapentin (NEURONTIN) 300 MG capsule TAKE 3 CAPSULES BY MOUTH AT BEDTIME 11/28/15  Yes [provider]  omeprazole (PRILOSEC) 40 MG capsule TAKE 1 CAPSULE BY MOUTH EVERY EVENING 04/11/19  Yes [provider]  predniSONE (DELTASONE) 10 MG tablet Take 3 tablets together daily x 4 days, then 2 tablets daily x 4 days, then 1 tablet daily. 07/20/19  Yes [provider]  ALPRAZolam (XANAX) 0.25 MG tablet Take 0.125-0.25 mg by mouth 2 (two) times daily as needed. 07/15/19   [provider]  budesonide (PULMICORT) 0.5 MG/2ML nebulizer solution Inhale into the lungs.    [provider]  fluconazole (DIFLUCAN) 150 MG tablet TAKE 1 TABLET BY MOUTH NOW. REPEAT IN 1 WEEK FOR vaginal yeast infection 03/04/19   [provider]  ipratropium (ATROVENT HFA) 17 MCG/ACT inhaler Inhale into the lungs.    [provider]  nystatin cream (MYCOSTATIN) SMARTSIG:1 Gram(s) Vaginal 2-4 Times Daily 03/04/19   [provider]    Current Facility-Administered Medications  Medication Dose Route Frequency Provider Last Rate Last Dose  . acetaminophen (TYLENOL) tablet 650 mg  650 mg Oral Q4H PRN Bennie Pierini, MD   650 mg at 07/27/19 1006  . albuterol (VENTOLIN HFA) 108 (90 Base) MCG/ACT inhaler 2 puff  2 puff Inhalation Q4H PRN Bennie Pierini, MD      . Chlorhexidine Gluconate Cloth 2 % PADS 6 each  6 each Topical Q0600 Bennie Pierini, MD   6 each at 07/27/19 0840  . gabapentin (NEURONTIN) capsule 900 mg  900 mg Oral QHS Bennie Pierini, MD   900 mg at 07/27/19 0020  . mometasone-formoterol (DULERA) 200-5 MCG/ACT inhaler 2 puff  2 puff Inhalation BID Bennie Pierini, MD   2 puff at 07/27/19 [phone]  . ondansetron (ZOFRAN) injection 4 mg  4 mg Intravenous Q6H PRN Bennie Pierini, MD      . pantoprazole (PROTONIX) injection 40 mg  40 mg Intravenous Q12H Bennie Pierini, MD   40 mg at 07/27/19 1034  . remdesivir 100 mg in sodium  chloride 0.9 % 250 mL IVPB  100 mg Intravenous Q24H Bryk, Veronda P, RPH      . umeclidinium bromide (INCRUSE ELLIPTA) 62.5 MCG/INH 1 puff  1 puff Inhalation Daily Bennie Pierini, MD   1 puff at 07/27/19 [id number]    Allergies as of 07/26/2019 - never reviewed  Allergen Reaction Noted  . Codeine Swelling 04/04/2015  . Salmon  [fish allergy] Swelling 04/04/2015  . Aspirin Tinitus 04/04/2015    No family history on file.  Social History   Socioeconomic History  . Marital status: Not on file    Spouse name: Not on file   . Number of children: Not on file  . Years of education: Not on file  . Highest education level: Not on file  Occupational History  . Not on file  Social Needs  . Financial resource strain: Not on file  . Food insecurity    Worry: Not on file    Inability: Not on file  . Transportation needs    Medical: Not on file    Non-medical: Not on file  Tobacco Use  . Smoking status: Current Every Day Smoker  Substance and Sexual Activity  . Alcohol use: Not on file  . Drug use: Not on file  . Sexual activity: Not on file  Lifestyle  . Physical activity    Days per week: Not on file    Minutes per session: Not on file  . Stress: Not on file  Relationships  . Social Herbalist on phone: Not on file    Gets together: Not on file    Attends religious service: Not on file    Active member of club or organization: Not on file    Attends meetings of clubs or organizations: Not on file    Relationship status: Not on file  . Intimate partner violence    Fear of current or ex partner: Not on file    Emotionally abused: Not on file    Physically abused: Not on file    Forced sexual activity: Not on file  Other Topics Concern  . Not on file  Social History Narrative  . Not on file    Review of Systems: As per HPI, all others negative  Physical Exam: Vital signs in last 24 hours: Temp:  [98 F (36.7 C)-98.6 F (37 C)] 98 F (36.7 C) (12/02 0828) Pulse Rate:  [89-108] 103 (12/02 0828) Resp:  [16-29] 29 (12/02 0828) BP: (79-109)/(61-81) 92/67 (12/02 0700) SpO2:  [98 %-100 %] 98 % (12/02 0828) Weight:  [58.9 kg] 58.9 kg (12/02 0456)   General:  Somewhat cachectic and chronically frail- and weak-appearing Head:  Normocephalic and atraumatic. Eyes:  Sclera clear, no icterus.   Conjunctiva pink. Ears:  Normal auditory acuity. Nose:  No deformity, discharge,  or lesions. Mouth:  No deformity or lesions.  Oropharynx pink & moist. Neck:  Supple; no masses or  thyromegaly. Lungs:  Clear throughout to auscultation.   No wheezes, crackles, or rhonchi. No acute distress. Heart:  Regular rate and rhythm; no murmurs, clicks, rubs,  or gallops. Abdomen:  Soft, nontender and nondistended. No masses, hepatosplenomegaly or hernias noted. Normal bowel sounds, without guarding, and without rebound.     Msk:  Symmetrical without gross deformities. Normal posture. Pulses:  Normal pulses noted. Extremities:  Without clubbing or edema. Neurologic:  Alert and  oriented x4;  Diffusely weak, otherwise grossly normal neurologically. Skin:  Fragile skin, scattered ecchymoses, otherwise  intact without significant lesions or rashes. Psych:  Alert and cooperative. Normal mood and affect.   Lab Results: Recent Labs    07/26/19 2321 07/27/19 0444  WBC 15.2* 10.5  HGB 10.3* 8.7*  HCT 30.7* 26.1*  PLT 142* 109*   BMET Recent Labs    07/27/19 0017 07/27/19 0444  NA 141 142  K 4.1 3.9  CL 106 107  CO2 26 27  GLUCOSE 110* 91  BUN 34* 28*  CREATININE 0.78 0.70  CALCIUM 8.8* 8.5*   LFT Recent Labs    07/27/19 0017  PROT 5.2*  ALBUMIN 2.9*  AST 21  ALT 15  ALKPHOS 48  BILITOT 0.8   PT/INR Recent Labs    07/27/19 0017  LABPROT 12.3  INR 0.9    Studies/Results: Dg Chest Port 1 View  Result Date: 07/27/2019 CLINICAL DATA:  Central line placement EXAM: PORTABLE CHEST 1 VIEW COMPARISON:  07/26/2019 FINDINGS: Central line is again seen with the tip in the SVC, stable position since prior study. No pneumothorax. No confluent airspace opacities. Heart is normal size. No effusions or acute bony abnormality. IMPRESSION: Right central line tip in the SVC. No acute cardiopulmonary disease. Electronically Signed   By: Rolm Baptise M.D.   On: 07/27/2019 00:20   Impression:  1.  Bloody stools (melena?) by report at outside facility.  No bowel movements or further GI bleeding since admission last night. 2.  Anemia, likely multifactorial, certainly component  of blood loss anemia. 3.  COPD, home O2. 4.  Coronary artery disease. 5.  Covid +.  Plan:  1.  Though mindful of patient's significant drop in Hgb from baseline, given patient's lack of bleeding since admission here and her multiple comorbidities (COPD on home O2 and new diagnosis of COVID), would pursue medical management (PPI, serial CBCs, gentle hydrate, blood transfusion as needed) and would not pursue endoscopy at the present time. Patient also verbalized that she is against endoscopy unless absolutely necessary.  2.  Would only do endoscopy if patient has clear evidence of active bleeding despite trial of medical management, at which point benefits of endoscopy may very well then outweigh the risks. 3.  OK to try clear liquids. 4.  Case discussed with patient's ICU nurse. 5.  [facility] will follow.   LOS: 1 day   Eveleen Mcnear M  07/27/2019, 11:11 AM  Cell [phone] If no answer or after 5 PM call [phone]

## 2019-07-28 DIAGNOSIS — J449 Chronic obstructive pulmonary disease, unspecified: Secondary | ICD-10-CM

## 2019-07-28 DIAGNOSIS — U071 COVID-19: Secondary | ICD-10-CM

## 2019-07-28 DIAGNOSIS — D62 Acute posthemorrhagic anemia: Secondary | ICD-10-CM

## 2019-07-28 DIAGNOSIS — D696 Thrombocytopenia, unspecified: Secondary | ICD-10-CM

## 2019-07-28 LAB — MAGNESIUM: Magnesium: 1.7 mg/dL (ref 1.7–2.4)

## 2019-07-28 LAB — GLUCOSE, CAPILLARY
Glucose-Capillary: 94 mg/dL (ref 70–99)
Glucose-Capillary: 130 mg/dL — ABNORMAL HIGH (ref 70–99)
Glucose-Capillary: 97 mg/dL (ref 70–99)

## 2019-07-28 LAB — CBC WITH DIFFERENTIAL/PLATELET
Abs Immature Granulocytes: 0.19 10*3/uL — ABNORMAL HIGH (ref 0.00–0.07)
Basophils Absolute: 0 10*3/uL (ref 0.0–0.1)
Basophils Relative: 0 %
Eosinophils Absolute: 0.2 10*3/uL (ref 0.0–0.5)
Eosinophils Relative: 3 %
HCT: 23.9 % — ABNORMAL LOW (ref 36.0–46.0)
Hemoglobin: 7.7 g/dL — ABNORMAL LOW (ref 12.0–15.0)
Immature Granulocytes: 3 %
Lymphocytes Relative: 11 %
Lymphs Abs: 0.8 10*3/uL (ref 0.7–4.0)
MCH: 31.7 pg (ref 26.0–34.0)
MCHC: 32.2 g/dL (ref 30.0–36.0)
MCV: 98.4 fL (ref 80.0–100.0)
Monocytes Absolute: 0.6 10*3/uL (ref 0.1–1.0)
Monocytes Relative: 9 %
Neutro Abs: 5.2 10*3/uL (ref 1.7–7.7)
Neutrophils Relative %: 74 %
Platelets: 114 10*3/uL — ABNORMAL LOW (ref 150–400)
RBC: 2.43 MIL/uL — ABNORMAL LOW (ref 3.87–5.11)
RDW: 16.1 % — ABNORMAL HIGH (ref 11.5–15.5)
WBC: 7 10*3/uL (ref 4.0–10.5)
nRBC: 0.4 % — ABNORMAL HIGH (ref 0.0–0.2)

## 2019-07-28 LAB — D-DIMER, QUANTITATIVE: D-Dimer, Quant: 2.77 ug/mL-FEU — ABNORMAL HIGH (ref 0.00–0.50)

## 2019-07-28 LAB — PHOSPHORUS: Phosphorus: 2.8 mg/dL (ref 2.5–4.6)

## 2019-07-28 LAB — HEMOGLOBIN AND HEMATOCRIT, BLOOD
HCT: 34.2 % — ABNORMAL LOW (ref 36.0–46.0)
Hemoglobin: 11.5 g/dL — ABNORMAL LOW (ref 12.0–15.0)

## 2019-07-28 LAB — PREPARE RBC (CROSSMATCH)

## 2019-07-28 LAB — SARS CORONAVIRUS 2 (TAT 6-24 HRS): SARS Coronavirus 2: POSITIVE — AB

## 2019-07-28 MED ORDER — ALPRAZOLAM 0.25 MG PO TABS
0.1250 mg | ORAL_TABLET | Freq: Two times a day (BID) | ORAL | Status: DC | PRN
  Administered 2019-07-28 – 2019-08-09 (×22): 0.25 mg via ORAL
  Filled 2019-07-28 (×22): qty 1

## 2019-07-28 MED ORDER — SODIUM CHLORIDE 0.9% IV SOLUTION
Freq: Once | INTRAVENOUS | Status: AC
  Administered 2019-07-28: 10:00:00 via INTRAVENOUS

## 2019-07-28 MED ORDER — SODIUM CHLORIDE 0.9 % IV BOLUS
1000.0000 mL | Freq: Once | INTRAVENOUS | Status: AC
  Administered 2019-07-28: 1000 mL via INTRAVENOUS

## 2019-07-28 NOTE — Progress Notes (Signed)
PROGRESS NOTE    Kelly Phillips  [id number] DOB: 1946-06-16 DOA: 07/26/2019 PCP: No primary care provider on file.   Brief Narrative: 73 year old with past medical history significant for COPD 3 L of oxygen, coronary artery disease, former smoker, lives by herself.  Presents with complaint of generalized weakness and fatigue for a couple of weeks.  He had a near syncopal episode while attempting to use the restroom and presented to the ED.  On questioning she did describe several dark watery bowel movement.  She was pale.  Her hemoglobin was 5.4 on admission. she was transfused 2 units of packed red blood cell.  In the ED she had short run of tachyarrhythmia and her Covid  test came back positive.  EKG shows some inferolateral ST depression.  She was admitted by the critical care team, GI was consulted and recommend supportive care.  Patient care was transitioned to triad on 12/3.   Assessment & Plan:   Active Problems:   Upper GI bleed  1-Acute blood loss anemia secondary to GI bleed: Patient received 2 unit of packed red blood cell. Hemoglobin increased to 8.7 post transfusion. Hemoglobin today down to 7.7. Patient is tachycardic, blood pressure systolic was soft and low early this morning.  We will proceed with another unit of packed red blood cell. Continue to monitor hemoglobin.   2-GI bleed; likely upper GI bleed.  No prior history of cirrhosis. Patient reports taking Goody's powder and ibuprofen. She drinks about 4 glasses of wine per week. Continue with IV Protonix. GI recommends support care no plans for endoscopic procedure at this time. Clear diet advance as tolerated.   3-Covid PCR positive: Chest x-ray clear. Continue with Remdesivir day 2/5. Appears to be asymptomatic. COVID-19 Labs  Recent Labs    07/27/19 0017 07/27/19 0444 07/28/19 0941  DDIMER 1.00* 1.19* 2.77*    Lab Results  Component Value Date   SARSCOV2NAA POSITIVE (A) 07/27/2019   ST  depression: Inferolateral depression on EKG from [facility]. Troponin negative. Keep hemoglobin above 8.  COPD chronic hypoxic respiratory failure on 2 L: Continue with Dulera.  Thrombocytopenia; related to consupmstion  from GI bleed or viral illness.    Estimated body mass index is 25.52 kg/m as calculated from the following:   Height as of this encounter: 5\' 2"  (1.575 m).   Weight as of this encounter: 63.3 kg.   DVT prophylaxis: SCD, no anticoagulation due to GI bleed.  Code Status: DNR Family Communication:care discussed with patient and son Disposition Plan: Remain I the hospital to further  Consultants:   GI  Procedures:   none  Antimicrobials:  none  Subjective: She denies further bowel movement. She is tolerating full liquid diet.  Denies abdominal pain or cough   Objective: Vitals:   07/28/19 0500 07/28/19 0600 07/28/19 0700 07/28/19 0733  BP: (!) 89/63 108/74 90/61   Pulse: 92 96 98   Resp: 19 (!) 22 15   Temp:    98.4 F (36.9 C)  TempSrc:    Oral  SpO2: 100% 100% 100%   Weight: 63.3 kg     Height:        Intake/Output Summary (Last 24 hours) at 07/28/2019 0750 Last data filed at 07/28/2019 0600 Gross per 24 hour  Intake 1250 ml  Output 800 ml  Net 450 ml   Filed Weights   07/26/19 2315 07/27/19 0456 07/28/19 0500  Weight: 58.9 kg 58.9 kg 63.3 kg    Examination:  General exam: Appears  calm and comfortable  Respiratory system: Clear to auscultation. Respiratory effort normal. Cardiovascular system: S1 & S2 heard, RRR. No JVD, murmurs, rubs, gallops or clicks. No pedal edema. Gastrointestinal system: Abdomen is nondistended, soft and nontender. No organomegaly or masses felt. Normal bowel sounds heard. Central nervous system: Alert and oriented. No focal neurological deficits. Extremities: Symmetric 5 x 5 power. Skin: No rashes, lesions or ulcers   Data Reviewed: I have personally reviewed following labs and imaging studies  CBC: Recent  Labs  Lab 07/26/19 2321 07/27/19 0444 07/27/19 1521 07/28/19 0512  WBC 15.2* 10.5 10.2 7.0  NEUTROABS 12.6*  --   --  5.2  HGB 10.3* 8.7* 8.4* 7.7*  HCT 30.7* 26.1* 24.9* 23.9*  MCV 95.0 95.3 94.0 98.4  PLT 142* 109* 118* [id number]*   Basic Metabolic Panel: Recent Labs  Lab 07/27/19 0017 07/27/19 0444  NA 141 142  K 4.1 3.9  CL 106 107  CO2 26 27  GLUCOSE 110* 91  BUN 34* 28*  CREATININE 0.78 0.70  CALCIUM 8.8* 8.5*  MG 1.8 1.8  PHOS 4.0 4.1   GFR: Estimated Creatinine Clearance: 56.4 mL/min (by C-G formula based on SCr of 0.7 mg/dL). Liver Function Tests: Recent Labs  Lab 07/27/19 0017  AST 21  ALT 15  ALKPHOS 48  BILITOT 0.8  PROT 5.2*  ALBUMIN 2.9*   No results for input(s): LIPASE, AMYLASE in the last 168 hours. No results for input(s): AMMONIA in the last 168 hours. Coagulation Profile: Recent Labs  Lab 07/27/19 0017  INR 0.9   Cardiac Enzymes: No results for input(s): CKTOTAL, CKMB, CKMBINDEX, TROPONINI in the last 168 hours. BNP (last 3 results) No results for input(s): PROBNP in the last 8760 hours. HbA1C: No results for input(s): HGBA1C in the last 72 hours. CBG: Recent Labs  Lab 07/27/19 0016 07/28/19 0727  GLUCAP 114* 94   Lipid Profile: No results for input(s): CHOL, HDL, LDLCALC, TRIG, CHOLHDL, LDLDIRECT in the last 72 hours. Thyroid Function Tests: No results for input(s): TSH, T4TOTAL, FREET4, T3FREE, THYROIDAB in the last 72 hours. Anemia Panel: No results for input(s): VITAMINB12, FOLATE, FERRITIN, TIBC, IRON, RETICCTPCT in the last 72 hours. Sepsis Labs: Recent Labs  Lab 07/27/19 0017 07/27/19 0248  LATICACIDVEN 0.9 0.9    Recent Results (from the past 240 hour(s))  MRSA PCR Screening     Status: None   Collection Time: 07/26/19 11:21 PM   Specimen: Nasal Mucosa; Nasopharyngeal  Result Value Ref Range Status   MRSA by PCR NEGATIVE NEGATIVE Final    Comment:        The GeneXpert MRSA Assay (FDA approved for NASAL  specimens only), is one component of a comprehensive MRSA colonization surveillance program. It is not intended to diagnose MRSA infection nor to guide or monitor treatment for MRSA infections. Performed at [facility], [place] [address] [place], [place] [address]   Culture, blood (routine x 2)     Status: None (Preliminary result)   Collection Time: 07/27/19  1:41 AM   Specimen: BLOOD LEFT HAND  Result Value Ref Range Status   Specimen Description BLOOD LEFT HAND  Final   Special Requests   Final    BOTTLES DRAWN AEROBIC ONLY Blood Culture adequate volume   Culture   Final    NO GROWTH 1 DAY Performed at [facility], [place] [address] [place],  [address]    Report Status PENDING  Incomplete  Culture, blood (routine x 2)  Status: None (Preliminary result)   Collection Time: 07/27/19  1:50 AM   Specimen: BLOOD LEFT HAND  Result Value Ref Range Status   Specimen Description BLOOD LEFT HAND  Final   Special Requests   Final    BOTTLES DRAWN AEROBIC ONLY Blood Culture results may not be optimal due to an inadequate volume of blood received in culture bottles   Culture   Final    NO GROWTH 1 DAY Performed at [facility], [place] [address], [place], [place] [address]    Report Status PENDING  Incomplete  SARS CORONAVIRUS 2 (TAT 6-24 HRS) Nasopharyngeal Nasopharyngeal Swab     Status: Abnormal   Collection Time: 07/27/19  4:41 PM   Specimen: Nasopharyngeal Swab  Result Value Ref Range Status   SARS Coronavirus 2 POSITIVE (A) NEGATIVE Final    Comment: RESULT CALLED TO, READ BACK BY AND VERIFIED WITH: Hedda Slade, RN AT [phone] ON 07/28/2019 BY SAINVILUS S (NOTE) SARS-CoV-2 target nucleic acids are DETECTED. The SARS-CoV-2 RNA is generally detectable in upper and lower respiratory specimens during the acute phase of infection. Positive results are indicative of the presence of SARS-CoV-2 RNA. Clinical correlation with patient history and other diagnostic  information is  necessary to determine patient infection status. Positive results do not rule out bacterial infection or co-infection with other viruses.  The expected result is Negative. Fact Sheet for Patients: SugarRoll.be Fact Sheet for Healthcare Providers: https://www.woods-mathews.com/ This test is not yet approved or cleared by the [place] FDA and  has been authorized for detection and/or diagnosis of SARS-CoV-2 by FDA under an Emergency Use Authorization (EUA). This EUA will remain  in effect (meaning this test can be  used) for the duration of the COVID-19 declaration under Section 564(b)(1) of the Act, 21 U.S.C. section 360bbb-3(b)(1), unless the authorization is terminated or revoked sooner. Performed at [facility], [place] [address], [place], [place] [address]          Radiology Studies: Dg Chest Port 1 View  Result Date: 07/27/2019 CLINICAL DATA:  Central line placement EXAM: PORTABLE CHEST 1 VIEW COMPARISON:  07/26/2019 FINDINGS: Central line is again seen with the tip in the SVC, stable position since prior study. No pneumothorax. No confluent airspace opacities. Heart is normal size. No effusions or acute bony abnormality. IMPRESSION: Right central line tip in the SVC. No acute cardiopulmonary disease. Electronically Signed   By: Rolm Baptise M.D.   On: 07/27/2019 00:20        Scheduled Meds:  Chlorhexidine Gluconate Cloth  6 each Topical Q0600   gabapentin  900 mg Oral QHS   mometasone-formoterol  2 puff Inhalation BID   pantoprazole (PROTONIX) IV  40 mg Intravenous Q12H   umeclidinium bromide  1 puff Inhalation Daily   Continuous Infusions:  remdesivir 100 mg in NS 250 mL Stopped (07/27/19 2332)     LOS: 2 days    Time spent: 35 minutes.     Elmarie Shiley, MD [facility]   If 7PM-7AM, please contact night-coverage [email or web address] Password [email or web address] 07/28/2019, 7:50 AM

## 2019-07-28 NOTE — Progress Notes (Signed)
[facility] Progress Note Patient Name: Kelly Phillips DOB: June 28, 1946 MRN: [id number]   Date of Service  07/28/2019  HPI/Events of Note  Hypotension - BP = 85/70 with MAP = 51  eICU Interventions  Will order: 1. Bolus with 0.9 NaCl 1 liter IV over 1 hour now. 2. CBC with Platelets at 5 AM.      Intervention Category Major Interventions: Hypotension - evaluation and management  Myosha Cuadras [place] 07/28/2019, 3:24 AM

## 2019-07-28 NOTE — Progress Notes (Signed)
Assisted tele visit to patient with family member.  Sekai Nayak Anderson, RN   

## 2019-07-28 NOTE — Progress Notes (Signed)
Subjective: Tolerating clear liquids. No GI bleeding per patient and per discussion with nursing.  Objective: Vital signs in last 24 hours: Temp:  [97.4 F (36.3 C)-98.8 F (37.1 C)] 98.6 F (37 C) (12/03 1315) Pulse Rate:  [89-110] 104 (12/03 1315) Resp:  [15-33] 20 (12/03 1315) BP: (61-118)/(46-85) 114/77 (12/03 1315) SpO2:  [93 %-100 %] 99 % (12/03 1315) Weight:  [63.3 kg] 63.3 kg (12/03 0500) Weight change: 4.4 kg Last BM Date: 07/26/19  PE: GEN:  Chronically ill-appearing but is in NAD, more interactive ABD:  Soft, non-tender  Lab Results: CBC    Component Value Date/Time   WBC 7.0 07/28/2019 0512   RBC 2.43 (L) 07/28/2019 0512   HGB 7.7 (L) 07/28/2019 0512   HCT 23.9 (L) 07/28/2019 0512   PLT 114 (L) 07/28/2019 0512   MCV 98.4 07/28/2019 0512   MCH 31.7 07/28/2019 0512   MCHC 32.2 07/28/2019 0512   RDW 16.1 (H) 07/28/2019 0512   LYMPHSABS 0.8 07/28/2019 0512   MONOABS 0.6 07/28/2019 0512   EOSABS 0.2 07/28/2019 0512   BASOSABS 0.0 07/28/2019 0512   CMP     Component Value Date/Time   NA 142 07/27/2019 0444   K 3.9 07/27/2019 0444   CL 107 07/27/2019 0444   CO2 27 07/27/2019 0444   GLUCOSE 91 07/27/2019 0444   BUN 28 (H) 07/27/2019 0444   CREATININE 0.70 07/27/2019 0444   CALCIUM 8.5 (L) 07/27/2019 0444   PROT 5.2 (L) 07/27/2019 0017   ALBUMIN 2.9 (L) 07/27/2019 0017   AST 21 07/27/2019 0017   ALT 15 07/27/2019 0017   ALKPHOS 48 07/27/2019 0017   BILITOT 0.8 07/27/2019 0017   GFRNONAA >60 07/27/2019 0444   GFRAA >60 07/27/2019 0444   Assessment:  1.  Bloody stools (melena?) by report at outside facility.  No further bleeding since transfer to [facility]. 2.  Anemia, likely multifactorial, certainly component of blood loss anemia. Drift down of hgb without overt GI bleeding. 3.  COPD, home O2. 4.  Coronary artery disease. 5.  Covid +.  Plan:  1.  Continue supportive care (IVF, blood transfusions as needed, PPI). 2.  Advance diet to full  liquids. 3.  Holding off on endoscopy in absence of overt destabilizing GI bleeding despite medical management. 4.  [facility] will follow.   Landry Dyke 07/28/2019, 2:09 PM   Cell [phone] If no answer or after 5 PM call [phone]

## 2019-07-28 NOTE — Plan of Care (Signed)
Assisted tele visit to patient with son.  Aleeah Greeno Anderson, RN   

## 2019-07-29 LAB — BASIC METABOLIC PANEL
Anion gap: 8 (ref 5–15)
BUN: 14 mg/dL (ref 8–23)
CO2: 26 mmol/L (ref 22–32)
Calcium: 8.1 mg/dL — ABNORMAL LOW (ref 8.9–10.3)
Chloride: 104 mmol/L (ref 98–111)
Creatinine, Ser: 0.72 mg/dL (ref 0.44–1.00)
GFR calc Af Amer: >60 mL/min (ref >60)
GFR calc non Af Amer: >60 mL/min (ref >60)
Glucose, Bld: 86 mg/dL (ref 70–99)
Potassium: 4.5 mmol/L (ref 3.5–5.1)
Sodium: 138 mmol/L (ref 135–145)

## 2019-07-29 LAB — CBC
HCT: 34.2 % — ABNORMAL LOW (ref 36.0–46.0)
Hemoglobin: 11.3 g/dL — ABNORMAL LOW (ref 12.0–15.0)
MCH: 31.7 pg (ref 26.0–34.0)
MCHC: 33 g/dL (ref 30.0–36.0)
MCV: 96.1 fL (ref 80.0–100.0)
Platelets: ADEQUATE 10*3/uL (ref 150–400)
RBC: 3.56 MIL/uL — ABNORMAL LOW (ref 3.87–5.11)
RDW: 15.3 % (ref 11.5–15.5)
WBC: 8.8 10*3/uL (ref 4.0–10.5)
nRBC: 0 % (ref 0.0–0.2)

## 2019-07-29 LAB — HEPATIC FUNCTION PANEL
ALT: 21 U/L (ref 0–44)
AST: 31 U/L (ref 15–41)
Albumin: 2.6 g/dL — ABNORMAL LOW (ref 3.5–5.0)
Alkaline Phosphatase: 55 U/L (ref 38–126)
Bilirubin, Direct: 0.1 mg/dL (ref 0.0–0.2)
Indirect Bilirubin: 0.4 mg/dL (ref 0.3–0.9)
Total Bilirubin: 0.5 mg/dL (ref 0.3–1.2)
Total Protein: 5.4 g/dL — ABNORMAL LOW (ref 6.5–8.1)

## 2019-07-29 LAB — TYPE AND SCREEN
ABO/RH(D): O POS
Antibody Screen: NEGATIVE
Unit division: 0

## 2019-07-29 LAB — BPAM RBC
Blood Product Expiration Date: 202101022359
ISSUE DATE / TIME: 202012030907
Unit Type and Rh: 5100

## 2019-07-29 LAB — MAGNESIUM: Magnesium: 1.8 mg/dL (ref 1.7–2.4)

## 2019-07-29 LAB — PHOSPHORUS: Phosphorus: 3.3 mg/dL (ref 2.5–4.6)

## 2019-07-29 LAB — GLUCOSE, CAPILLARY: Glucose-Capillary: 77 mg/dL (ref 70–99)

## 2019-07-29 LAB — D-DIMER, QUANTITATIVE: D-Dimer, Quant: 2.88 ug/mL-FEU — ABNORMAL HIGH (ref 0.00–0.50)

## 2019-07-29 MED ORDER — CHLORHEXIDINE GLUCONATE CLOTH 2 % EX PADS
6.0000 | MEDICATED_PAD | Freq: Every day | CUTANEOUS | Status: DC
  Administered 2019-07-30 – 2019-08-09 (×7): 6 via TOPICAL

## 2019-07-29 MED ORDER — SODIUM CHLORIDE 0.9 % IV SOLN
100.0000 mg | Freq: Every day | INTRAVENOUS | Status: AC
  Administered 2019-07-29 – 2019-07-30 (×2): 100 mg via INTRAVENOUS
  Filled 2019-07-29 (×2): qty 20

## 2019-07-29 MED ORDER — POLYETHYLENE GLYCOL 3350 17 G PO PACK
17.0000 g | PACK | Freq: Every day | ORAL | Status: DC
  Administered 2019-07-29 – 2019-08-08 (×6): 17 g via ORAL
  Filled 2019-07-29 (×9): qty 1

## 2019-07-29 NOTE — Progress Notes (Signed)
PROGRESS NOTE    Kelly Phillips  [id number] DOB: 1945/11/18 DOA: 07/26/2019 PCP: No primary care provider on file.   Brief Narrative: 73 year old with past medical history significant for COPD 3 L of oxygen, coronary artery disease, former smoker, lives by herself.  Presents with complaint of generalized weakness and fatigue for a couple of weeks.  He had a near syncopal episode while attempting to use the restroom and presented to the ED.  On questioning she did describe several dark watery bowel movement.  She was pale.  Her hemoglobin was 5.4 on admission. she was transfused 2 units of packed red blood cell.  In the ED she had short run of tachyarrhythmia and her Covid  test came back positive.  EKG shows some inferolateral ST depression.  She was admitted by the critical care team, GI was consulted and recommend supportive care.  Patient care was transitioned to [facility] on 12/3.   Assessment & Plan:   Active Problems:   Upper GI bleed   COPD (chronic obstructive pulmonary disease) (HCC)   COVID-19 virus infection   Acute blood loss anemia   Thrombocytopenia (HCC)  1-Acute blood loss anemia secondary to GI bleed: Patient received 2 unit of packed red blood cell. Hemoglobin increased to 8.7 post transfusion. Received third unit PRBC 12-03. Hb post transfusion 11 No further BM.  Advanced diet.    2-GI bleed; likely upper GI bleed.  No prior history of cirrhosis. Patient reports taking Goody's powder and ibuprofen. She drinks about 4 glasses of wine per week. Continue with IV Protonix. GI recommends support care no plans for endoscopic procedure at this time. Clear diet advance as tolerated. Hb stable.   3-Covid PCR positive: Chest x-ray clear. Continue with Remdesivir day 3/5. Appears to be asymptomatic. COVID-19 Labs  Recent Labs    07/27/19 0444 07/28/19 0941 07/29/19 0744  DDIMER 1.19* 2.77* 2.88*    Lab Results  Component Value Date   SARSCOV2NAA POSITIVE (A)  07/27/2019   ST depression: Inferolateral depression on EKG from [place]. Troponin negative. Keep hemoglobin above 8.  COPD chronic hypoxic respiratory failure on 2 L: Continue with Dulera.  Thrombocytopenia; related to consupmstion  from GI bleed or viral illness.    Estimated body mass index is 25.52 kg/m as calculated from the following:   Height as of this encounter: 5\' 2"  (1.575 m).   Weight as of this encounter: 63.3 kg.   DVT prophylaxis: SCD, no anticoagulation due to GI bleed.  Code Status: DNR Family Communication:care discussed with patient and son Disposition Plan: Remain I the hospital to further  Consultants:   GI  Procedures:   none  Antimicrobials:  none  Subjective: She is feeling well, she is passing gas.  She has not had BM since admission, she takes laxatives at home.   Objective: Vitals:   07/29/19 0700 07/29/19 0800 07/29/19 0900 07/29/19 1000  BP: (!) 143/95 (!) 123/93 109/79 107/74  Pulse: (!) 105 (!) 108 (!) 106 100  Resp: (!) 28 20 17 15   Temp: 98.5 F (36.9 C)     TempSrc: Oral     SpO2: 99% 93% 100% 97%  Weight:      Height:        Intake/Output Summary (Last 24 hours) at 07/29/2019 1056 Last data filed at 07/29/2019 1000 Gross per 24 hour  Intake 1945 ml  Output 1600 ml  Net 345 ml   Filed Weights   07/26/19 2315 07/27/19 0456 07/28/19 0500  Weight:  58.9 kg 58.9 kg 63.3 kg    Examination:  General exam: NAD Respiratory system: CTA Cardiovascular system: S 1, S 2 RRR Gastrointestinal system: BS present, soft, nt Central nervous system: alert, non focal.  Extremities: Symmetric power.  Skin: No rashes   Data Reviewed: I have personally reviewed following labs and imaging studies  CBC: Recent Labs  Lab 07/26/19 2321 07/27/19 0444 07/27/19 1521 07/28/19 0512 07/28/19 1845 07/29/19 0744  WBC 15.2* 10.5 10.2 7.0  --  8.8  NEUTROABS 12.6*  --   --  5.2  --   --   HGB 10.3* 8.7* 8.4* 7.7* 11.5* 11.3*  HCT  30.7* 26.1* 24.9* 23.9* 34.2* 34.2*  MCV 95.0 95.3 94.0 98.4  --  96.1  PLT 142* 109* 118* 114*  --  PLATELET CLUMPS NOTED ON SMEAR, COUNT APPEARS ADEQUATE   Basic Metabolic Panel: Recent Labs  Lab 07/27/19 0017 07/27/19 0444 07/28/19 0941 07/29/19 0744  NA 141 142  --  138  K 4.1 3.9  --  4.5  CL 106 107  --  104  CO2 26 27  --  26  GLUCOSE 110* 91  --  86  BUN 34* 28*  --  14  CREATININE 0.78 0.70  --  0.72  CALCIUM 8.8* 8.5*  --  8.1*  MG 1.8 1.8 1.7 1.8  PHOS 4.0 4.1 2.8 3.3   GFR: Estimated Creatinine Clearance: 56.4 mL/min (by C-G formula based on SCr of 0.72 mg/dL). Liver Function Tests: Recent Labs  Lab 07/27/19 0017  AST 21  ALT 15  ALKPHOS 48  BILITOT 0.8  PROT 5.2*  ALBUMIN 2.9*   No results for input(s): LIPASE, AMYLASE in the last 168 hours. No results for input(s): AMMONIA in the last 168 hours. Coagulation Profile: Recent Labs  Lab 07/27/19 0017  INR 0.9   Cardiac Enzymes: No results for input(s): CKTOTAL, CKMB, CKMBINDEX, TROPONINI in the last 168 hours. BNP (last 3 results) No results for input(s): PROBNP in the last 8760 hours. HbA1C: No results for input(s): HGBA1C in the last 72 hours. CBG: Recent Labs  Lab 07/27/19 0016 07/28/19 0727 07/28/19 1112 07/28/19 1617 07/29/19 0752  GLUCAP 114* 94 130* 97 77   Lipid Profile: No results for input(s): CHOL, HDL, LDLCALC, TRIG, CHOLHDL, LDLDIRECT in the last 72 hours. Thyroid Function Tests: No results for input(s): TSH, T4TOTAL, FREET4, T3FREE, THYROIDAB in the last 72 hours. Anemia Panel: No results for input(s): VITAMINB12, FOLATE, FERRITIN, TIBC, IRON, RETICCTPCT in the last 72 hours. Sepsis Labs: Recent Labs  Lab 07/27/19 0017 07/27/19 0248  LATICACIDVEN 0.9 0.9    Recent Results (from the past 240 hour(s))  MRSA PCR Screening     Status: None   Collection Time: 07/26/19 11:21 PM   Specimen: Nasal Mucosa; Nasopharyngeal  Result Value Ref Range Status   MRSA by PCR NEGATIVE  NEGATIVE Final    Comment:        The GeneXpert MRSA Assay (FDA approved for NASAL specimens only), is one component of a comprehensive MRSA colonization surveillance program. It is not intended to diagnose MRSA infection nor to guide or monitor treatment for MRSA infections. Performed at [facility], [place] [address] [place], [place] [address]   Culture, blood (routine x 2)     Status: None (Preliminary result)   Collection Time: 07/27/19  1:41 AM   Specimen: BLOOD LEFT HAND  Result Value Ref Range Status   Specimen Description BLOOD LEFT HAND  Final  Special Requests   Final    BOTTLES DRAWN AEROBIC ONLY Blood Culture adequate volume   Culture   Final    NO GROWTH 2 DAYS Performed at [facility], [place] [address], [place], [place] [address]    Report Status PENDING  Incomplete  Culture, blood (routine x 2)     Status: None (Preliminary result)   Collection Time: 07/27/19  1:50 AM   Specimen: BLOOD LEFT HAND  Result Value Ref Range Status   Specimen Description BLOOD LEFT HAND  Final   Special Requests   Final    BOTTLES DRAWN AEROBIC ONLY Blood Culture results may not be optimal due to an inadequate volume of blood received in culture bottles   Culture   Final    NO GROWTH 2 DAYS Performed at [facility], [place] [address], [place], [place] [address]    Report Status PENDING  Incomplete  SARS CORONAVIRUS 2 (TAT 6-24 HRS) Nasopharyngeal Nasopharyngeal Swab     Status: Abnormal   Collection Time: 07/27/19  4:41 PM   Specimen: Nasopharyngeal Swab  Result Value Ref Range Status   SARS Coronavirus 2 POSITIVE (A) NEGATIVE Final    Comment: RESULT CALLED TO, READ BACK BY AND VERIFIED WITH: Hedda Slade, RN AT [phone] ON 07/28/2019 BY SAINVILUS S (NOTE) SARS-CoV-2 target nucleic acids are DETECTED. The SARS-CoV-2 RNA is generally detectable in upper and lower respiratory specimens during the acute phase of infection. Positive results are indicative of the presence  of SARS-CoV-2 RNA. Clinical correlation with patient history and other diagnostic information is  necessary to determine patient infection status. Positive results do not rule out bacterial infection or co-infection with other viruses.  The expected result is Negative. Fact Sheet for Patients: SugarRoll.be Fact Sheet for Healthcare Providers: https://www.woods-mathews.com/ This test is not yet approved or cleared by the [place] FDA and  has been authorized for detection and/or diagnosis of SARS-CoV-2 by FDA under an Emergency Use Authorization (EUA). This EUA will remain  in effect (meaning this test can be  used) for the duration of the COVID-19 declaration under Section 564(b)(1) of the Act, 21 U.S.C. section 360bbb-3(b)(1), unless the authorization is terminated or revoked sooner. Performed at [facility], [place] [address] [place],  [address]          Radiology Studies: No results found.      Scheduled Meds: . Chlorhexidine Gluconate Cloth  6 each Topical Q0600  . gabapentin  900 mg Oral QHS  . mometasone-formoterol  2 puff Inhalation BID  . pantoprazole (PROTONIX) IV  40 mg Intravenous Q12H  . polyethylene glycol  17 g Oral Daily  . umeclidinium bromide  1 puff Inhalation Daily   Continuous Infusions: . remdesivir 100 mg in NS 100 mL       LOS: 3 days    Time spent: 35 minutes.     Elmarie Shiley, MD [facility]   If 7PM-7AM, please contact night-coverage [email or web address] Password [email or web address] 07/29/2019, 10:56 AM

## 2019-07-29 NOTE — Care Management (Signed)
CM acknowledges consult with pts concerns with returning home due to weakness.  CM ordered PT and OT eval.  TOC will continue to follow for discharge needs

## 2019-07-29 NOTE — Evaluation (Signed)
Physical Therapy Evaluation Patient Details Name: Kelly Phillips MRN: [id number] DOB: Oct 13, 1945 Today's Date: 07/29/2019   History of Present Illness  73 year old female with PMH of COPD (chronic obstructive pulmonary disease) on 3L O2, PAD (peripheral artery disease), and SMA stenosis and visual impairment. She lives at home by herself. presented to [facility] ED 12/1 with complaints of generalized weakness and fatigue for a couple weeks. She had a near syncopal episode while attempting to use the restroom, and described several dark watery bowel movements. Hemoglobin discovered to be 5.4. and she was transfused 2 units. ED course otherwise significant for short run on tachyarrhythmia and COVID+  Clinical Impression  PTA pt living alone in mobile home with 3 steps to enter. Pt is largely independent with mobility and ADLs. Due to pt visual impairments requires assist from caregiver 2x/wk for 3 hrs for taking her to her hairstylist and running errands. Pt is currently limited in safe mobility by decreased strength and balance. Pt requires supervision for bed mobility and min Ax2 for transfers and ambulation of 10 feet with 2 person HHA. Given pt's decline is safe mobility and decreased caregiver support PT recommending SNF level rehab prior to return home. PT will continue to follow acutely.     Follow Up Recommendations SNF    Equipment Recommendations  None recommended by PT       Precautions / Restrictions Precautions Precautions: Fall Precaution Comments: COVID+ Restrictions Weight Bearing Restrictions: No      Mobility  Bed Mobility Overal bed mobility: Needs Assistance Bed Mobility: Supine to Sit     Supine to sit: Supervision;HOB elevated     General bed mobility comments: supervision for safety   Transfers Overall transfer level: Needs assistance Equipment used: 2 person hand held assist Transfers: Sit to/from Stand Sit to Stand: Min assist;+2 physical assistance          General transfer comment: min Ax2 for steadying for excessive posterior lean  Ambulation/Gait Ambulation/Gait assistance: Min assist;+2 physical assistance;+2 safety/equipment Gait Distance (Feet): 10 Feet Assistive device: 2 person hand held assist Gait Pattern/deviations: Decreased step length - right;Decreased step length - left;Step-through pattern;Shuffle Gait velocity: slowed Gait velocity interpretation: <1.8 ft/sec, indicate of risk for recurrent falls General Gait Details: min Ax2 for steadying with slow, shuffling gait, no overt LoB      Balance Overall balance assessment: Needs assistance Sitting-balance support: Feet unsupported;No upper extremity supported Sitting balance-Leahy Scale: Fair     Standing balance support: Bilateral upper extremity supported Standing balance-Leahy Scale: Poor Standing balance comment: requires outside support to maintain static balance                             Pertinent Vitals/Pain Pain Assessment: No/denies pain    Home Living Family/patient expects to be discharged to:: Private residence Living Arrangements: Alone Available Help at Discharge: Personal care attendant(2x/wk) Type of Home: Mobile home Home Access: Stairs to enter Entrance Stairs-Rails: Can reach both Entrance Stairs-Number of Steps: 3 Home Layout: One level Home Equipment: Grab bars - tub/shower;Hand held shower head;Wheelchair - manual;Tub bench;Walker - 2 wheels;Other (comment)(upright walker)      Prior Function Level of Independence: Independent         Comments: care giver comes 2x/week to take to hair dresser and run errands due to visula impairment        Extremity/Trunk Assessment   Upper Extremity Assessment Upper Extremity Assessment: Defer to OT evaluation  Lower Extremity Assessment Lower Extremity Assessment: Generalized weakness       Communication   Communication: No difficulties  Cognition  Arousal/Alertness: Awake/alert Behavior During Therapy: WFL for tasks assessed/performed Overall Cognitive Status: Within Functional Limits for tasks assessed                                        General Comments General comments (skin integrity, edema, etc.): SaO2 on 3L O2 96%O2 at rest, after ambulation SaO2 92%O2        Assessment/Plan    PT Assessment Patient needs continued PT services  PT Problem List Decreased strength;Decreased activity tolerance;Decreased balance;Decreased mobility       PT Treatment Interventions DME instruction;Gait training;Functional mobility training;Therapeutic activities;Therapeutic exercise;Balance training;Cognitive remediation;Patient/family education    PT Goals (Current goals can be found in the Care Plan section)  Acute Rehab PT Goals Patient Stated Goal: go home when she is safe to go home PT Goal Formulation: With patient Time For Goal Achievement: 08/12/19 Potential to Achieve Goals: Good    Frequency Min 2X/week   Barriers to discharge Decreased caregiver support      Co-evaluation PT/OT/SLP Co-Evaluation/Treatment: Yes Reason for Co-Treatment: To address functional/ADL transfers PT goals addressed during session: Mobility/safety with mobility         AM-PAC PT "6 Clicks" Mobility  Outcome Measure Help needed turning from your back to your side while in a flat bed without using bedrails?: None Help needed moving from lying on your back to sitting on the side of a flat bed without using bedrails?: None Help needed moving to and from a bed to a chair (including a wheelchair)?: A Little Help needed standing up from a chair using your arms (e.g., wheelchair or bedside chair)?: A Little Help needed to walk in hospital room?: A Little Help needed climbing 3-5 steps with a railing? : A Lot 6 Click Score: 19    End of Session Equipment Utilized During Treatment: Oxygen Activity Tolerance: Patient tolerated  treatment well Patient left: in chair;with chair alarm set;with call bell/phone within reach Nurse Communication: Mobility status PT Visit Diagnosis: Unsteadiness on feet (R26.81);Other abnormalities of gait and mobility (R26.89);Muscle weakness (generalized) (M62.81);Difficulty in walking, not elsewhere classified (R26.2)    Time: [id number] PT Time Calculation (min) (ACUTE ONLY): 34 min   Charges:   PT Evaluation $PT Eval Moderate Complexity: 1 Mod          Madeleyn Schwimmer B. Migdalia Dk PT, DPT Acute Rehabilitation Services Pager [phone] Office [phone]   [place] 07/29/2019, 2:34 PM

## 2019-07-29 NOTE — Progress Notes (Signed)
Subjective: No blood in stool. Tolerating diet.  Objective: Vital signs in last 24 hours: Temp:  [97.7 F (36.5 C)-98.5 F (36.9 C)] 98.4 F (36.9 C) (12/04 1200) Pulse Rate:  [87-122] 111 (12/04 1400) Resp:  [12-29] 29 (12/04 1400) BP: (74-143)/(57-95) 110/83 (12/04 1200) SpO2:  [93 %-100 %] 99 % (12/04 1400) Weight change:  Last BM Date: 07/26/19  PE: GEN:  NAD  Lab Results: CBC    Component Value Date/Time   WBC 8.8 07/29/2019 0744   RBC 3.56 (L) 07/29/2019 0744   HGB 11.3 (L) 07/29/2019 0744   HCT 34.2 (L) 07/29/2019 0744   PLT  07/29/2019 0744    PLATELET CLUMPS NOTED ON SMEAR, COUNT APPEARS ADEQUATE   MCV 96.1 07/29/2019 0744   MCH 31.7 07/29/2019 0744   MCHC 33.0 07/29/2019 0744   RDW 15.3 07/29/2019 0744   LYMPHSABS 0.8 07/28/2019 0512   MONOABS 0.6 07/28/2019 0512   EOSABS 0.2 07/28/2019 0512   BASOSABS 0.0 07/28/2019 0512   CMP     Component Value Date/Time   NA 138 07/29/2019 0744   K 4.5 07/29/2019 0744   CL 104 07/29/2019 0744   CO2 26 07/29/2019 0744   GLUCOSE 86 07/29/2019 0744   BUN 14 07/29/2019 0744   CREATININE 0.72 07/29/2019 0744   CALCIUM 8.1 (L) 07/29/2019 0744   PROT 5.4 (L) 07/29/2019 0744   ALBUMIN 2.6 (L) 07/29/2019 0744   AST 31 07/29/2019 0744   ALT 21 07/29/2019 0744   ALKPHOS 55 07/29/2019 0744   BILITOT 0.5 07/29/2019 0744   GFRNONAA >60 07/29/2019 0744   GFRAA >60 07/29/2019 0744   Assessment:  1. Bloody stools (melena?) by report at outside facility. No further bleeding since transfer to [facility]. 2. Anemia, likely multifactorial, certainly component of blood loss anemia. Drift down of hgb without overt GI bleeding. 3. COPD, home O2. 4. Coronary artery disease. 5. Covid +.  Plan:  1.  Continue PPI. 2.  Advance diet. 3.  No blood in stool during the 3 days I have been seeing patient. 4.  Endoscopy not planned, in absence of overt destabilizing bleeding. 5.  [facility] will sign-off; please call with  questions; she can follow-up with her local PCP upon hospital discharge; we're happy to see back in our office on as-needed basis.   Kelly Phillips 07/29/2019, 2:21 PM   Cell [phone] If no answer or after 5 PM call [phone]

## 2019-07-29 NOTE — Evaluation (Signed)
Occupational Therapy Evaluation Patient Details Name: Kelly Phillips MRN: [id number] DOB: [birth year] Today's Date: 07/29/2019    History of Present Illness 73 year old female with PMH of COPD (chronic obstructive pulmonary disease) on 3L O2, PAD (peripheral artery disease), and SMA stenosis and visual impairment. She lives at home by herself. presented to [facility] ED 12/1 with complaints of generalized weakness and fatigue for a couple weeks. She had a near syncopal episode while attempting to use the restroom, and described several dark watery bowel movements. Hemoglobin discovered to be 5.4. and she was transfused 2 units. ED course otherwise significant for short run on tachyarrhythmia and COVID+   Clinical Impression   PTA pt living alone in mobile home with x2 a week assist from PCA for IADL support. At time of eval, pt is able to complete bed mobility supervision level of assist and sit <> stand with min A to steady from posterior lean. She required +2 HHA for functional mobility around the room. She was then able to complete grooming tasks seated up in chair. Pt on 3L [place] with VSS. Given current status, recommend SNF d/c prior to home considering pt lives alone and will need additional support at beginning. Will continue to follow per POC listed below.    Follow Up Recommendations  SNF;Supervision/Assistance - 24 hour    Equipment Recommendations  None recommended by OT    Recommendations for Other Services       Precautions / Restrictions Precautions Precautions: Fall Precaution Comments: COVID+ Restrictions Weight Bearing Restrictions: No      Mobility Bed Mobility Overal bed mobility: Needs Assistance Bed Mobility: Supine to Sit     Supine to sit: Supervision;HOB elevated     General bed mobility comments: supervision for safety   Transfers Overall transfer level: Needs assistance Equipment used: 2 person hand held assist Transfers: Sit to/from Stand Sit to Stand:  Min assist;+2 physical assistance         General transfer comment: min Ax2 for steadying for excessive posterior lean    Balance Overall balance assessment: Needs assistance Sitting-balance support: Feet unsupported;No upper extremity supported Sitting balance-Leahy Scale: Fair     Standing balance support: Bilateral upper extremity supported Standing balance-Leahy Scale: Poor Standing balance comment: requires outside support to maintain static balance, has posterior lean with initial standing                           ADL either performed or assessed with clinical judgement   ADL Overall ADL's : Needs assistance/impaired Eating/Feeding: Set up;Sitting   Grooming: Minimal assistance;Standing Grooming Details (indicate cue type and reason): min A to steady Upper Body Bathing: Minimal assistance;Sitting   Lower Body Bathing: Minimal assistance;Sit to/from stand;Sitting/lateral leans   Upper Body Dressing : Minimal assistance;Sitting   Lower Body Dressing: Minimal assistance;Sit to/from stand;Sitting/lateral leans   Toilet Transfer: Minimal assistance;+2 for physical assistance;+2 for safety/equipment;Regular Toilet;Grab bars   Toileting- Clothing Manipulation and Hygiene: Min guard;Sit to/from stand   Tub/ Shower Transfer: Minimal assistance;Ambulation;Rolling walker;3 in 1   Functional mobility during ADLs: Minimal assistance;+2 for physical assistance;+2 for safety/equipment General ADL Comments: pt limited by generalized weakness and decreased activity tolerance     Vision   Additional Comments: pt reports low vision at baseline. Needs things magnified so she can appropriately see     Perception     Praxis      Pertinent Vitals/Pain Pain Assessment: No/denies pain  Hand Dominance     Extremity/Trunk Assessment Upper Extremity Assessment Upper Extremity Assessment: Generalized weakness   Lower Extremity Assessment Lower Extremity  Assessment: Defer to PT evaluation       Communication Communication Communication: No difficulties   Cognition Arousal/Alertness: Awake/alert Behavior During Therapy: WFL for tasks assessed/performed Overall Cognitive Status: Within Functional Limits for tasks assessed                                     General Comments  SaO2 on 3L O2 96%O2 at rest, after ambulation SaO2 92%O2    Exercises     Shoulder Instructions      Home Living Family/patient expects to be discharged to:: Private residence Living Arrangements: Alone Available Help at Discharge: Personal care attendant;Other (Comment)(2x per week for IADLs) Type of Home: Mobile home Home Access: Stairs to enter Entrance Stairs-Number of Steps: 3 Entrance Stairs-Rails: Can reach both Home Layout: One level     Bathroom Shower/Tub: Teacher, early years/pre: Standard Bathroom Accessibility: No   Home Equipment: Grab bars - tub/shower;Hand held shower head;Wheelchair - manual;Tub bench;Walker - 2 wheels;Walker - 4 wheels          Prior Functioning/Environment Level of Independence: Independent        Comments: care giver comes 2x/week to take to hair dresser and run errands due to visual impairment, otherwise independent in BADL and some light meal prep        OT Problem List: Decreased strength;Decreased knowledge of use of DME or AE;Decreased activity tolerance;Cardiopulmonary status limiting activity;Impaired balance (sitting and/or standing)      OT Treatment/Interventions: Self-care/ADL training;Therapeutic exercise;Visual/perceptual remediation/compensation;Patient/family education;Balance training;Energy conservation;Therapeutic activities;DME and/or AE instruction    OT Goals(Current goals can be found in the care plan section) Acute Rehab OT Goals Patient Stated Goal: go home when she is safe to go home OT Goal Formulation: With patient Time For Goal Achievement:  08/12/19 Potential to Achieve Goals: Good  OT Frequency: Min 2X/week   Barriers to D/C:            Co-evaluation PT/OT/SLP Co-Evaluation/Treatment: Yes Reason for Co-Treatment: To address functional/ADL transfers;For patient/therapist safety PT goals addressed during session: Mobility/safety with mobility OT goals addressed during session: ADL's and self-care      AM-PAC OT "6 Clicks" Daily Activity     Outcome Measure Help from another person eating meals?: A Little Help from another person taking care of personal grooming?: A Little Help from another person toileting, which includes using toliet, bedpan, or urinal?: A Lot Help from another person bathing (including washing, rinsing, drying)?: A Lot Help from another person to put on and taking off regular upper body clothing?: A Little Help from another person to put on and taking off regular lower body clothing?: A Lot 6 Click Score: 15   End of Session Equipment Utilized During Treatment: Gait belt;Rolling walker Nurse Communication: Mobility status  Activity Tolerance: Patient tolerated treatment well Patient left: in chair;with call bell/phone within reach  OT Visit Diagnosis: Unsteadiness on feet (R26.81);Other abnormalities of gait and mobility (R26.89);Muscle weakness (generalized) (M62.81)                Time: [id number] OT Time Calculation (min): 29 min Charges:  OT General Charges $OT Visit: 1 Visit OT Evaluation $OT Eval Moderate Complexity: [place], MSOT, OTR/L [employer] OT/ Acute Relief OT [place] Office: [phone]  Zenovia Jarred 07/29/2019, 3:19 PM

## 2019-07-29 NOTE — Progress Notes (Signed)
Pt safely transferred to 5W16 via wheelchair. Pt on 3L [place] during transport with VSS. Report previously given to [place], Therapist, sports. Pt's son called and updated with pt's transfer.

## 2019-07-30 ENCOUNTER — Encounter (HOSPITAL_COMMUNITY): Payer: Self-pay

## 2019-07-30 LAB — CBC
HCT: 31.5 % — ABNORMAL LOW (ref 36.0–46.0)
Hemoglobin: 10.1 g/dL — ABNORMAL LOW (ref 12.0–15.0)
MCH: 31.6 pg (ref 26.0–34.0)
MCHC: 32.1 g/dL (ref 30.0–36.0)
MCV: 98.4 fL (ref 80.0–100.0)
Platelets: 120 10*3/uL — ABNORMAL LOW (ref 150–400)
RBC: 3.2 MIL/uL — ABNORMAL LOW (ref 3.87–5.11)
RDW: 15.1 % (ref 11.5–15.5)
WBC: 7.2 10*3/uL (ref 4.0–10.5)
nRBC: 0 % (ref 0.0–0.2)

## 2019-07-30 LAB — COMPREHENSIVE METABOLIC PANEL
ALT: 21 U/L (ref 0–44)
AST: 26 U/L (ref 15–41)
Albumin: 2.4 g/dL — ABNORMAL LOW (ref 3.5–5.0)
Alkaline Phosphatase: 57 U/L (ref 38–126)
Anion gap: 8 (ref 5–15)
BUN: 10 mg/dL (ref 8–23)
CO2: 26 mmol/L (ref 22–32)
Calcium: 7.9 mg/dL — ABNORMAL LOW (ref 8.9–10.3)
Chloride: 105 mmol/L (ref 98–111)
Creatinine, Ser: 0.78 mg/dL (ref 0.44–1.00)
GFR calc Af Amer: >60 mL/min (ref >60)
GFR calc non Af Amer: >60 mL/min (ref >60)
Glucose, Bld: 76 mg/dL (ref 70–99)
Potassium: 4 mmol/L (ref 3.5–5.1)
Sodium: 139 mmol/L (ref 135–145)
Total Bilirubin: 0.4 mg/dL (ref 0.3–1.2)
Total Protein: 4.7 g/dL — ABNORMAL LOW (ref 6.5–8.1)

## 2019-07-30 LAB — MAGNESIUM: Magnesium: 1.9 mg/dL (ref 1.7–2.4)

## 2019-07-30 LAB — PHOSPHORUS: Phosphorus: 3.1 mg/dL (ref 2.5–4.6)

## 2019-07-30 LAB — C-REACTIVE PROTEIN: CRP: 2 mg/dL — ABNORMAL HIGH (ref <1.0)

## 2019-07-30 MED ORDER — PANTOPRAZOLE SODIUM 40 MG PO TBEC
40.0000 mg | DELAYED_RELEASE_TABLET | Freq: Two times a day (BID) | ORAL | Status: DC
  Administered 2019-07-30: 40 mg via ORAL
  Filled 2019-07-30: qty 1

## 2019-07-30 NOTE — Progress Notes (Signed)
Dr. Hartford Poli made aware of Pt bp 86/69, Asymptomatic. No new orders given at this time. Will continue to monitor.

## 2019-07-30 NOTE — NC FL2 (Signed)
[place] MEDICAID FL2 LEVEL OF CARE SCREENING TOOL     IDENTIFICATION  Patient Name: Kelly Phillips Birthdate: March 17, 1946 Sex: female Admission Date (Current Location): 07/26/2019  [facility] and [place] Number:  Herbalist and Address:  [facility], [place] [address] [place], [place] [address]      Provider Number:    Attending Physician Name and Address:  Elmarie Shiley, MD  Relative Name and Phone Number:  Orvilla Cornwall [phone]    Current Level of Care: Hospital Recommended Level of Care: [place] Prior Approval Number:    Date Approved/Denied:   PASRR Number: [id number]  Discharge Plan: SNF    Current Diagnoses: Patient Active Problem List   Diagnosis Date Noted  . COPD (chronic obstructive pulmonary disease) ([place])   . COVID-19 virus infection   . Acute blood loss anemia   . Thrombocytopenia ([place])   . Upper GI bleed 07/26/2019  . Ventilator dependence, reintubated 9/7 for return to OR for thrombectomy, Massive volume overload, may need trach 05/11/2015  . Tobacco abuse , life long smoker 05/11/2015  . Atherosclerotic peripheral vascular disease, post AF bypass 9/6 with return to OR 9/7 for thrombectomy 05/11/2015  . Arthritis 05/11/2015  . Macular degeneration 05/11/2015    Orientation RESPIRATION BLADDER Height & Weight     Self, Time, Situation, Place  Normal Continent Weight: 142 lb 6.7 oz (64.6 kg) Height:  5\' 2"  (157.5 cm)  BEHAVIORAL SYMPTOMS/MOOD NEUROLOGICAL BOWEL NUTRITION STATUS      Continent Diet  AMBULATORY STATUS COMMUNICATION OF NEEDS Skin   Extensive Assist Verbally Normal                       Personal Care Assistance Level of Assistance  Bathing, Feeding, Dressing Bathing Assistance: Maximum assistance Feeding assistance: Limited assistance Dressing Assistance: Maximum assistance     Functional Limitations Info  Sight, Hearing, Speech Sight Info: Impaired Hearing  Info: Adequate Speech Info: Adequate    SPECIAL CARE FACTORS FREQUENCY  PT (By licensed PT), OT (By licensed OT)     PT Frequency: 5x per week OT Frequency: 5x per week            Contractures Contractures Info: Not present    Additional Factors Info  Code Status Code Status Info: DNR             Current Medications (07/30/2019):  This is the current hospital active medication list Current Facility-Administered Medications  Medication Dose Route Frequency Provider Last Rate Last Dose  . acetaminophen (TYLENOL) tablet 650 mg  650 mg Oral Q4H PRN Regalado, Belkys A, MD   650 mg at 07/30/19 1008  . albuterol (VENTOLIN HFA) 108 (90 Base) MCG/ACT inhaler 2 puff  2 puff Inhalation Q4H PRN Regalado, Belkys A, MD      . ALPRAZolam Duanne Moron) tablet 0.125-0.25 mg  0.125-0.25 mg Oral BID PRN Regalado, Belkys A, MD   0.25 mg at 07/30/19 1008  . Chlorhexidine Gluconate Cloth 2 % PADS 6 each  6 each Topical Q0600 Regalado, Belkys A, MD      . gabapentin (NEURONTIN) capsule 900 mg  900 mg Oral QHS Regalado, Belkys A, MD   900 mg at 07/29/19 2152  . mometasone-formoterol (DULERA) 200-5 MCG/ACT inhaler 2 puff  2 puff Inhalation BID Regalado, Belkys A, MD   2 puff at 07/30/19 0938  . ondansetron (ZOFRAN) injection 4 mg  4 mg Intravenous Q6H PRN Regalado, Belkys A,  MD      . pantoprazole (PROTONIX) EC tablet 40 mg  40 mg Oral BID AC Hammons, Kimberly B, RPH      . polyethylene glycol (MIRALAX / GLYCOLAX) packet 17 g  17 g Oral Daily Regalado, Belkys A, MD   17 g at 07/30/19 0938  . umeclidinium bromide (INCRUSE ELLIPTA) 62.5 MCG/INH 1 puff  1 puff Inhalation Daily Regalado, Belkys A, MD   1 puff at 07/30/19 1008     Discharge Medications: Please see discharge summary for a list of discharge medications.  Relevant Imaging Results:  Relevant Lab Results:   Additional Information social security number [id number]  Elliot Gurney [place], [place]

## 2019-07-30 NOTE — TOC Initial Note (Signed)
Transition of Care [facility]) - Initial/Assessment Note    Patient Details  Name: Kelly Phillips MRN: [id number] Date of Birth: 1945/12/24  [facility] of [facility]) CM/SW Contact:    Elliot Gurney [place], [place] Phone Number: 07/30/2019, 1:50 PM  Clinical Narrative:                 Patient is a 73 year old female who presented to [facility] ED on 12/1 with complaints of generalized weakness and fatigue for a couple of weeks. Her COVID test has come back positive as well. Patient alert and oriented x4.  Patient resides alone in a mobile home and states that she has a private duty aid to assist her 2 days per week. Patient agrees with plan for rehab  Stay to get strong enough to return home and live independently again.  Patient explained role of CSW and SNF referral process. Patient would like to return to [facility] in [place] where she received rehab in the past. Patient is open to SNF referrals to other facilities if [facility] is not available.  This Education officer, museum to complete FL2 and will fax out for bed offers. Bed offers will be reviewed with patient once received.  Social work to continue to follow patient to assist with discharge needs.    Elliot Gurney, LCSW Clinical Social Worker  [phone]   Expected Discharge Plan: Skilled Nursing Facility Barriers to Discharge: No Barriers Identified   Patient Goals and CMS Choice Patient states their goals for this hospitalization and ongoing recovery are:: "right now I am weak, I want to get sronger" CMS Medicare.gov Compare Post Acute Care list provided to:: Patient Choice offered to / list presented to : Patient  Expected Discharge Plan and Services Expected Discharge Plan: [place] In-house Referral: Clinical Social Work Discharge Planning Services: NA Post Acute Care Choice: [place] Living arrangements for the past 2 months: Mobile Home                                      Prior Living  Arrangements/Services Living arrangements for the past 2 months: Mobile Home Lives with:: Self Patient language and need for interpreter reviewed:: No Do you feel safe going back to the place where you live?: Yes      Need for Family Participation in Patient Care: Yes (Comment) Care giver support system in place?: Yes (comment) Current home services: Homehealth aide(2 days a week) Criminal Activity/Legal Involvement Pertinent to Current Situation/Hospitalization: No - Comment as needed  Activities of Daily Living Home Assistive Devices/Equipment: None ADL Screening (condition at time of admission) Patient's cognitive ability adequate to safely complete daily activities?: Yes Is the patient deaf or have difficulty hearing?: No Does the patient have difficulty seeing, even when wearing glasses/contacts?: Yes Does the patient have difficulty concentrating, remembering, or making decisions?: No Patient able to express need for assistance with ADLs?: Yes Does the patient have difficulty dressing or bathing?: Yes Independently performs ADLs?: No Does the patient have difficulty walking or climbing stairs?: No Weakness of Legs: None Weakness of Arms/Hands: None  Permission Sought/Granted Permission sought to share information with : Facility Sport and exercise psychologist Permission granted to share information with : Yes, Verbal Permission Granted  Share Information with NAME: Orvilla Cornwall     Permission granted to share info w Relationship: friend  Permission granted to share info w Contact Information: [phone]  Emotional Assessment Appearance:: Other (  Comment Required Attitude/Demeanor/Rapport: Engaged, Self-Confident Affect (typically observed): Accepting, Adaptable, Appropriate Orientation: : Oriented to Self, Oriented to Place, Oriented to  Time, Oriented to Situation Alcohol / Substance Use: Not Applicable Psych Involvement: No (comment)  Admission diagnosis:  GASTROINTESTINAL  BLEED ANEMIA COVID 19 VIRUS INFECTION Patient Active Problem List   Diagnosis Date Noted  . COPD (chronic obstructive pulmonary disease) ([place])   . COVID-19 virus infection   . Acute blood loss anemia   . Thrombocytopenia ([place])   . Upper GI bleed 07/26/2019  . Ventilator dependence, reintubated 9/7 for return to OR for thrombectomy, Massive volume overload, may need trach 05/11/2015  . Tobacco abuse , life long smoker 05/11/2015  . Atherosclerotic peripheral vascular disease, post AF bypass 9/6 with return to OR 9/7 for thrombectomy 05/11/2015  . Arthritis 05/11/2015  . Macular degeneration 05/11/2015   PCP:  No primary care provider on file. Pharmacy:   [facility], [address] [place] [address] Phone: [phone] Fax: [phone]     Social Determinants of Health (SDOH) Interventions    Readmission Risk Interventions No flowsheet data found.

## 2019-07-30 NOTE — Plan of Care (Signed)

## 2019-07-30 NOTE — Progress Notes (Addendum)
PROGRESS NOTE    Kelly Phillips  [id number] DOB: December 13, 1945 DOA: 07/26/2019 PCP: No primary care provider on file.   Brief Narrative: 73 year old with past medical history significant for COPD 3 L of oxygen, coronary artery disease, former smoker, lives by herself.  Presents with complaint of generalized weakness and fatigue for a couple of weeks.  He had a near syncopal episode while attempting to use the restroom and presented to the ED.  On questioning she did describe several dark watery bowel movement.  She was pale.  Her hemoglobin was 5.4 on admission. she was transfused 2 units of packed red blood cell.  In the ED she had short run of tachyarrhythmia and her Covid  test came back positive.  EKG shows some inferolateral ST depression.  She was admitted by the critical care team, GI was consulted and recommend supportive care.  Patient care was transitioned to [facility] on 12/3.   Assessment & Plan:   Active Problems:   Upper GI bleed   COPD (chronic obstructive pulmonary disease) (HCC)   COVID-19 virus infection   Acute blood loss anemia   Thrombocytopenia (HCC)  1-Acute blood loss anemia secondary to GI bleed: Patient received 2 unit of packed red blood cell. Hemoglobin increased to 8.7 post transfusion. Received third unit PRBC 12-03. Hb post transfusion 11 Had a dark BM last night. Hb at 10. Continue to monitor.  Advanced diet.    2-GI bleed; likely upper GI bleed.  No prior history of cirrhosis. Patient reports taking Goody's powder and ibuprofen. She drinks about 4 glasses of wine per week. Continue with IV Protonix. GI recommends support care no plans for endoscopic procedure at this time. Hb at 10. Continue to monitor.  Advance die to regular.   3-Covid PCR positive: Chest x-ray clear. Continue with Remdesivir doses  5/5. Appears to be asymptomatic. Stable.  COVID-19 Labs  Recent Labs    07/28/19 0941 07/29/19 0744 07/30/19 0500  DDIMER 2.77* 2.88*  --    CRP  --   --  2.0*    Lab Results  Component Value Date   SARSCOV2NAA POSITIVE (A) 07/27/2019   ST depression: Inferolateral depression on EKG from [facility]. Troponin negative. Keep hemoglobin above 8.  COPD chronic hypoxic respiratory failure on 2 L: Continue with Dulera.  Thrombocytopenia; related to consupmstion  from GI bleed or viral illness.    Estimated body mass index is 26.05 kg/m as calculated from the following:   Height as of this encounter: 5\' 2"  (1.575 m).   Weight as of this encounter: 64.6 kg.   DVT prophylaxis: SCD, no anticoagulation due to GI bleed.  Code Status: DNR Family Communication:care discussed with patient Disposition Plan: Remain I the hospital to further  Consultants:   GI  Procedures:   none  Antimicrobials:  none  Subjective: She is feeling ok.  Denies abdominal pain.  Had black BM last night   Objective: Vitals:   07/30/19 0400 07/30/19 0751 07/30/19 0800 07/30/19 1200  BP: (!) 83/65 95/82 115/80 106/89  Pulse: 90 93 90 97  Resp: 19 20 19 19   Temp: 98.5 F (36.9 C)  97.9 F (36.6 C) 97.6 F (36.4 C)  TempSrc: Oral Oral Oral Oral  SpO2: 99% 100% 99% 98%  Weight:      Height:        Intake/Output Summary (Last 24 hours) at 07/30/2019 1435 Last data filed at 07/30/2019 1326 Gross per 24 hour  Intake 940 ml  Output 1275  ml  Net -335 ml   Filed Weights   07/27/19 0456 07/28/19 0500 07/30/19 0122  Weight: 58.9 kg 63.3 kg 64.6 kg    Examination:  General exam: NAD Respiratory system: CTA Cardiovascular system: S 1, S 2 RRR Gastrointestinal system: BS present, soft, nt Central nervous system: non focal.  Extremities: symmetric power.  Skin: No rashes   Data Reviewed: I have personally reviewed following labs and imaging studies  CBC: Recent Labs  Lab 07/26/19 2321 07/27/19 0444 07/27/19 1521 07/28/19 0512 07/28/19 1845 07/29/19 0744 07/30/19 0500  WBC 15.2* 10.5 10.2 7.0  --  8.8 7.2  NEUTROABS  12.6*  --   --  5.2  --   --   --   HGB 10.3* 8.7* 8.4* 7.7* 11.5* 11.3* 10.1*  HCT 30.7* 26.1* 24.9* 23.9* 34.2* 34.2* 31.5*  MCV 95.0 95.3 94.0 98.4  --  96.1 98.4  PLT 142* 109* 118* 114*  --  PLATELET CLUMPS NOTED ON SMEAR, COUNT APPEARS ADEQUATE [id number]*   Basic Metabolic Panel: Recent Labs  Lab 07/27/19 0017 07/27/19 0444 07/28/19 0941 07/29/19 0744 07/30/19 0500  NA 141 142  --  138 139  K 4.1 3.9  --  4.5 4.0  CL 106 107  --  104 105  CO2 26 27  --  26 26  GLUCOSE 110* 91  --  86 76  BUN 34* 28*  --  14 10  CREATININE 0.78 0.70  --  0.72 0.78  CALCIUM 8.8* 8.5*  --  8.1* 7.9*  MG 1.8 1.8 1.7 1.8 1.9  PHOS 4.0 4.1 2.8 3.3 3.1   GFR: Estimated Creatinine Clearance: 56.9 mL/min (by C-G formula based on SCr of 0.78 mg/dL). Liver Function Tests: Recent Labs  Lab 07/27/19 0017 07/29/19 0744 07/30/19 0500  AST 21 31 26   ALT 15 21 21   ALKPHOS 48 55 57  BILITOT 0.8 0.5 0.4  PROT 5.2* 5.4* 4.7*  ALBUMIN 2.9* 2.6* 2.4*   No results for input(s): LIPASE, AMYLASE in the last 168 hours. No results for input(s): AMMONIA in the last 168 hours. Coagulation Profile: Recent Labs  Lab 07/27/19 0017  INR 0.9   Cardiac Enzymes: No results for input(s): CKTOTAL, CKMB, CKMBINDEX, TROPONINI in the last 168 hours. BNP (last 3 results) No results for input(s): PROBNP in the last 8760 hours. HbA1C: No results for input(s): HGBA1C in the last 72 hours. CBG: Recent Labs  Lab 07/27/19 0016 07/28/19 0727 07/28/19 1112 07/28/19 1617 07/29/19 0752  GLUCAP 114* 94 130* 97 77   Lipid Profile: No results for input(s): CHOL, HDL, LDLCALC, TRIG, CHOLHDL, LDLDIRECT in the last 72 hours. Thyroid Function Tests: No results for input(s): TSH, T4TOTAL, FREET4, T3FREE, THYROIDAB in the last 72 hours. Anemia Panel: No results for input(s): VITAMINB12, FOLATE, FERRITIN, TIBC, IRON, RETICCTPCT in the last 72 hours. Sepsis Labs: Recent Labs  Lab 07/27/19 0017 07/27/19 0248  LATICACIDVEN  0.9 0.9    Recent Results (from the past 240 hour(s))  MRSA PCR Screening     Status: None   Collection Time: 07/26/19 11:21 PM   Specimen: Nasal Mucosa; Nasopharyngeal  Result Value Ref Range Status   MRSA by PCR NEGATIVE NEGATIVE Final    Comment:        The GeneXpert MRSA Assay (FDA approved for NASAL specimens only), is one component of a comprehensive MRSA colonization surveillance program. It is not intended to diagnose MRSA infection nor to guide or monitor treatment for MRSA infections. Performed  at [facility], [place] [address] [place], [place] [address]   Culture, blood (routine x 2)     Status: None (Preliminary result)   Collection Time: 07/27/19  1:41 AM   Specimen: BLOOD LEFT HAND  Result Value Ref Range Status   Specimen Description BLOOD LEFT HAND  Final   Special Requests   Final    BOTTLES DRAWN AEROBIC ONLY Blood Culture adequate volume   Culture   Final    NO GROWTH 3 DAYS Performed at [facility], [place] [address] [place], [place] [address]    Report Status PENDING  Incomplete  Culture, blood (routine x 2)     Status: None (Preliminary result)   Collection Time: 07/27/19  1:50 AM   Specimen: BLOOD LEFT HAND  Result Value Ref Range Status   Specimen Description BLOOD LEFT HAND  Final   Special Requests   Final    BOTTLES DRAWN AEROBIC ONLY Blood Culture results may not be optimal due to an inadequate volume of blood received in culture bottles   Culture   Final    NO GROWTH 3 DAYS Performed at [facility], [place] [address], [place], [place] [address]    Report Status PENDING  Incomplete  SARS CORONAVIRUS 2 (TAT 6-24 HRS) Nasopharyngeal Nasopharyngeal Swab     Status: Abnormal   Collection Time: 07/27/19  4:41 PM   Specimen: Nasopharyngeal Swab  Result Value Ref Range Status   SARS Coronavirus 2 POSITIVE (A) NEGATIVE Final    Comment: RESULT CALLED TO, READ BACK BY AND VERIFIED WITH: Hedda Slade, RN AT [phone] ON 07/28/2019 BY SAINVILUS  S (NOTE) SARS-CoV-2 target nucleic acids are DETECTED. The SARS-CoV-2 RNA is generally detectable in upper and lower respiratory specimens during the acute phase of infection. Positive results are indicative of the presence of SARS-CoV-2 RNA. Clinical correlation with patient history and other diagnostic information is  necessary to determine patient infection status. Positive results do not rule out bacterial infection or co-infection with other viruses.  The expected result is Negative. Fact Sheet for Patients: SugarRoll.be Fact Sheet for Healthcare Providers: https://www.woods-mathews.com/ This test is not yet approved or cleared by the [place] FDA and  has been authorized for detection and/or diagnosis of SARS-CoV-2 by FDA under an Emergency Use Authorization (EUA). This EUA will remain  in effect (meaning this test can be  used) for the duration of the COVID-19 declaration under Section 564(b)(1) of the Act, 21 U.S.C. section 360bbb-3(b)(1), unless the authorization is terminated or revoked sooner. Performed at [facility], [place] [address] [place], [place] [address]          Radiology Studies: No results found.      Scheduled Meds: . Chlorhexidine Gluconate Cloth  6 each Topical Q0600  . gabapentin  900 mg Oral QHS  . mometasone-formoterol  2 puff Inhalation BID  . pantoprazole  40 mg Oral BID AC  . polyethylene glycol  17 g Oral Daily  . umeclidinium bromide  1 puff Inhalation Daily   Continuous Infusions:    LOS: 4 days    Time spent: 35 minutes.     Elmarie Shiley, MD [facility]   If 7PM-7AM, please contact night-coverage [email or web address] Password [email or web address] 07/30/2019, 2:35 PM

## 2019-07-31 ENCOUNTER — Inpatient Hospital Stay (HOSPITAL_COMMUNITY): Payer: Medicare Other | Admitting: Certified Registered"

## 2019-07-31 ENCOUNTER — Encounter (HOSPITAL_COMMUNITY)
Admission: AD | Disposition: A | Discharge status: Skilled Nursing Facility | Payer: Self-pay | Source: Other Acute Inpatient Hospital | Attending: Family Medicine

## 2019-07-31 ENCOUNTER — Encounter (HOSPITAL_COMMUNITY): Payer: Self-pay | Admitting: Certified Registered"

## 2019-07-31 HISTORY — PX: ESOPHAGOGASTRODUODENOSCOPY (EGD) WITH PROPOFOL: SHX5813

## 2019-07-31 HISTORY — PX: SCLEROTHERAPY: SHX6841

## 2019-07-31 HISTORY — PX: HEMOSTASIS CLIP PLACEMENT: SHX6857

## 2019-07-31 LAB — CBC
HCT: 22.5 % — ABNORMAL LOW (ref 36.0–46.0)
HCT: 28.1 % — ABNORMAL LOW (ref 36.0–46.0)
HCT: 20.8 % — ABNORMAL LOW (ref 36.0–46.0)
Hemoglobin: 7.3 g/dL — ABNORMAL LOW (ref 12.0–15.0)
Hemoglobin: 9.6 g/dL — ABNORMAL LOW (ref 12.0–15.0)
Hemoglobin: 6.7 g/dL — CL (ref 12.0–15.0)
MCH: 32.2 pg (ref 26.0–34.0)
MCH: 32.5 pg (ref 26.0–34.0)
MCH: 30.9 pg (ref 26.0–34.0)
MCHC: 32.4 g/dL (ref 30.0–36.0)
MCHC: 34.2 g/dL (ref 30.0–36.0)
MCHC: 32.2 g/dL (ref 30.0–36.0)
MCV: 99.1 fL (ref 80.0–100.0)
MCV: 95.3 fL (ref 80.0–100.0)
MCV: 95.9 fL (ref 80.0–100.0)
Platelets: 107 10*3/uL — ABNORMAL LOW (ref 150–400)
Platelets: 90 10*3/uL — ABNORMAL LOW (ref 150–400)
Platelets: 93 10*3/uL — ABNORMAL LOW (ref 150–400)
RBC: 2.27 MIL/uL — ABNORMAL LOW (ref 3.87–5.11)
RBC: 2.95 MIL/uL — ABNORMAL LOW (ref 3.87–5.11)
RBC: 2.17 MIL/uL — ABNORMAL LOW (ref 3.87–5.11)
RDW: 15.1 % (ref 11.5–15.5)
RDW: 14.1 % (ref 11.5–15.5)
RDW: 15.8 % — ABNORMAL HIGH (ref 11.5–15.5)
WBC: 8.4 10*3/uL (ref 4.0–10.5)
WBC: 8 10*3/uL (ref 4.0–10.5)
WBC: 9.3 10*3/uL (ref 4.0–10.5)
nRBC: 0.4 % — ABNORMAL HIGH (ref 0.0–0.2)
nRBC: 0.3 % — ABNORMAL HIGH (ref 0.0–0.2)
nRBC: 0.4 % — ABNORMAL HIGH (ref 0.0–0.2)

## 2019-07-31 LAB — PROTIME-INR
INR: 1.5 — ABNORMAL HIGH (ref 0.8–1.2)
Prothrombin Time: 17.8 s — ABNORMAL HIGH (ref 11.4–15.2)

## 2019-07-31 LAB — COMPREHENSIVE METABOLIC PANEL
ALT: 17 U/L (ref 0–44)
AST: 20 U/L (ref 15–41)
Albumin: 1.9 g/dL — ABNORMAL LOW (ref 3.5–5.0)
Alkaline Phosphatase: 50 U/L (ref 38–126)
Anion gap: 5 (ref 5–15)
BUN: 15 mg/dL (ref 8–23)
CO2: 25 mmol/L (ref 22–32)
Calcium: 6.9 mg/dL — ABNORMAL LOW (ref 8.9–10.3)
Chloride: 107 mmol/L (ref 98–111)
Creatinine, Ser: 0.74 mg/dL (ref 0.44–1.00)
GFR calc Af Amer: >60 mL/min (ref >60)
GFR calc non Af Amer: >60 mL/min (ref >60)
Glucose, Bld: 152 mg/dL — ABNORMAL HIGH (ref 70–99)
Potassium: 4.7 mmol/L (ref 3.5–5.1)
Sodium: 137 mmol/L (ref 135–145)
Total Bilirubin: 0.4 mg/dL (ref 0.3–1.2)
Total Protein: 3.7 g/dL — ABNORMAL LOW (ref 6.5–8.1)

## 2019-07-31 LAB — MRSA PCR SCREENING: MRSA by PCR: NEGATIVE

## 2019-07-31 LAB — TROPONIN I (HIGH SENSITIVITY): Troponin I (High Sensitivity): 7 ng/L (ref <18)

## 2019-07-31 LAB — PHOSPHORUS: Phosphorus: 2.8 mg/dL (ref 2.5–4.6)

## 2019-07-31 LAB — MAGNESIUM: Magnesium: 1.5 mg/dL — ABNORMAL LOW (ref 1.7–2.4)

## 2019-07-31 LAB — C-REACTIVE PROTEIN: CRP: 1.6 mg/dL — ABNORMAL HIGH (ref <1.0)

## 2019-07-31 LAB — GLUCOSE, CAPILLARY: Glucose-Capillary: 154 mg/dL — ABNORMAL HIGH (ref 70–99)

## 2019-07-31 LAB — PREPARE RBC (CROSSMATCH)

## 2019-07-31 SURGERY — ESOPHAGOGASTRODUODENOSCOPY (EGD) WITH PROPOFOL
Anesthesia: General

## 2019-07-31 SURGERY — EGD (ESOPHAGOGASTRODUODENOSCOPY)
Anesthesia: General

## 2019-07-31 MED ORDER — PROPOFOL 10 MG/ML IV BOLUS
INTRAVENOUS | Status: DC | PRN
  Administered 2019-07-31: 150 mg via INTRAVENOUS

## 2019-07-31 MED ORDER — MAGNESIUM SULFATE 2 GM/50ML IV SOLN
2.0000 g | Freq: Once | INTRAVENOUS | Status: AC
  Administered 2019-07-31: 2 g via INTRAVENOUS
  Filled 2019-07-31: qty 50

## 2019-07-31 MED ORDER — SODIUM CHLORIDE 0.9% IV SOLUTION: Freq: Once | INTRAVENOUS | Status: DC

## 2019-07-31 MED ORDER — SODIUM CHLORIDE 0.9 % IV BOLUS
1000.0000 mL | Freq: Once | INTRAVENOUS | Status: AC
  Administered 2019-07-31: 1000 mL via INTRAVENOUS

## 2019-07-31 MED ORDER — SODIUM CHLORIDE 0.9 % IV BOLUS
500.0000 mL | Freq: Once | INTRAVENOUS | Status: AC
  Administered 2019-07-31: 500 mL via INTRAVENOUS

## 2019-07-31 MED ORDER — SODIUM CHLORIDE 0.9 % IV SOLN
80.0000 mg | Freq: Once | INTRAVENOUS | Status: AC
  Administered 2019-07-31: 80 mg via INTRAVENOUS
  Filled 2019-07-31: qty 80

## 2019-07-31 MED ORDER — SODIUM CHLORIDE (PF) 0.9 % IJ SOLN
PREFILLED_SYRINGE | INTRAMUSCULAR | Status: DC | PRN
  Administered 2019-07-31: 20:00:00 2 mL

## 2019-07-31 MED ORDER — SODIUM CHLORIDE 0.9 % IV SOLN
8.0000 mg/h | INTRAVENOUS | Status: AC
  Administered 2019-07-31 – 2019-08-03 (×6): 8 mg/h via INTRAVENOUS
  Filled 2019-07-31 (×7): qty 80

## 2019-07-31 MED ORDER — SUCCINYLCHOLINE 20MG/ML (10ML) SYRINGE FOR MEDFUSION PUMP - OPTIME
INTRAMUSCULAR | Status: DC | PRN
  Administered 2019-07-31: 100 mg via INTRAVENOUS

## 2019-07-31 MED ORDER — LIDOCAINE HCL (CARDIAC) PF 100 MG/5ML IV SOSY
PREFILLED_SYRINGE | INTRAVENOUS | Status: DC | PRN
  Administered 2019-07-31: 50 mg via INTRATRACHEAL

## 2019-07-31 MED ORDER — PHENYLEPHRINE HCL (PRESSORS) 10 MG/ML IV SOLN
INTRAVENOUS | Status: DC | PRN
  Administered 2019-07-31 (×2): 80 ug via INTRAVENOUS
  Administered 2019-07-31: 120 ug via INTRAVENOUS

## 2019-07-31 MED ORDER — PANTOPRAZOLE SODIUM 40 MG IV SOLR
40.0000 mg | Freq: Two times a day (BID) | INTRAVENOUS | Status: DC
  Administered 2019-07-31: 40 mg via INTRAVENOUS
  Filled 2019-07-31: qty 40

## 2019-07-31 MED ORDER — LACTATED RINGERS IV SOLN
INTRAVENOUS | Status: DC | PRN
  Administered 2019-07-31: 20:00:00 via INTRAVENOUS

## 2019-07-31 MED ORDER — MIDAZOLAM HCL 2 MG/2ML IJ SOLN
INTRAMUSCULAR | Status: DC | PRN
  Administered 2019-07-31: 2 mg via INTRAVENOUS

## 2019-07-31 MED ORDER — PANTOPRAZOLE SODIUM 40 MG IV SOLR
40.0000 mg | Freq: Two times a day (BID) | INTRAVENOUS | Status: DC
  Administered 2019-08-04: 40 mg via INTRAVENOUS
  Filled 2019-07-31: qty 40

## 2019-07-31 MED ORDER — FENTANYL CITRATE (PF) 250 MCG/5ML IJ SOLN
INTRAMUSCULAR | Status: AC
  Filled 2019-07-31: qty 5

## 2019-07-31 MED ORDER — MIDAZOLAM HCL 2 MG/2ML IJ SOLN
INTRAMUSCULAR | Status: AC
  Filled 2019-07-31: qty 2

## 2019-07-31 MED ORDER — DEXAMETHASONE 2 MG PO TABS
2.0000 mg | ORAL_TABLET | Freq: Three times a day (TID) | ORAL | Status: DC
  Filled 2019-07-31 (×3): qty 1

## 2019-07-31 MED ORDER — SODIUM CHLORIDE 0.9% IV SOLUTION
Freq: Once | INTRAVENOUS | Status: AC
  Administered 2019-07-31: 07:00:00 via INTRAVENOUS

## 2019-07-31 MED ORDER — EPINEPHRINE 1 MG/10ML IJ SOSY
PREFILLED_SYRINGE | INTRAMUSCULAR | Status: AC
  Filled 2019-07-31: qty 10

## 2019-07-31 SURGICAL SUPPLY — 15 items

## 2019-07-31 NOTE — Anesthesia Procedure Notes (Signed)
Procedure Name: Intubation Date/Time: 07/31/2019 7:50 PM Performed by: Claris Che, CRNA Pre-anesthesia Checklist: Patient identified, Emergency Drugs available, Suction available, Patient being monitored and Timeout performed Patient Re-evaluated:Patient Re-evaluated prior to induction Oxygen Delivery Method: Circle system utilized Preoxygenation: Pre-oxygenation with 100% oxygen Induction Type: IV induction, Rapid sequence and Cricoid Pressure applied Laryngoscope Size: Mac and 4 Grade View: Grade III Tube type: Oral Tube size: 7.5 mm Number of attempts: 1 Airway Equipment and Method: Stylet Placement Confirmation: ETT inserted through vocal cords under direct vision,  positive ETCO2 and breath sounds checked- equal and bilateral Secured at: 22 cm Tube secured with: Tape Dental Injury: Teeth and Oropharynx as per pre-operative assessment

## 2019-07-31 NOTE — Anesthesia Preprocedure Evaluation (Signed)
Anesthesia Evaluation  Patient identified by MRN, date of birth, ID band Patient awake    Reviewed: Allergy & Precautions, H&P , NPO status , Patient's Chart, lab work & pertinent test results  Airway Mallampati: II   Neck ROM: full    Dental   Pulmonary COPD, Current Smoker,  COVID+   breath sounds clear to auscultation       Cardiovascular + Peripheral Vascular Disease   Rhythm:regular Rate:Normal     Neuro/Psych    GI/Hepatic GI bleeding   Endo/Other    Renal/GU      Musculoskeletal  (+) Arthritis ,   Abdominal   Peds  Hematology  (+) Blood dyscrasia, anemia ,   Anesthesia Other Findings   Reproductive/Obstetrics                             Anesthesia Physical Anesthesia Plan  ASA: III and emergent  Anesthesia Plan: General   Post-op Pain Management:    Induction: Intravenous  PONV Risk Score and Plan: 2 and Ondansetron, Dexamethasone and Treatment may vary due to age or medical condition  Airway Management Planned: Oral ETT  Additional Equipment:   Intra-op Plan:   Post-operative Plan: Extubation in OR  Informed Consent: I have reviewed the patients History and Physical, chart, labs and discussed the procedure including the risks, benefits and alternatives for the proposed anesthesia with the patient or authorized representative who has indicated his/her understanding and acceptance.       Plan Discussed with: CRNA, Anesthesiologist and Surgeon  Anesthesia Plan Comments:         Anesthesia Quick Evaluation

## 2019-07-31 NOTE — Progress Notes (Signed)
I was notified by nursing staff of hypotension and pt c/o feeling like passing out. Primary svc APP X. Blount notified as well and came to bedside. Kelly Phillips denied CP, SOB. She stated she did not feel well. She is anxious. She is alert and oriented x4.  Reported pt was nauseated and received zofran. She is pale, warm and dry.  HR 135 ST, 106/79 (88), RR 19 with sats 100%.   Orders received for IVF, CBC/CMET, EKG, trop

## 2019-07-31 NOTE — Progress Notes (Signed)
Subjective: Called back to see patient because of blood in stool. Had some episodes of melena and maroon stools yesterday and hgb drop.  Objective: Vital signs in last 24 hours: Temp:  [97.3 F (36.3 C)-99.1 F (37.3 C)] 99.1 F (37.3 C) (12/06 1200) Pulse Rate:  [96-146] 100 (12/06 0930) Resp:  [15-28] 15 (12/06 0930) BP: (68-129)/(36-94) 110/84 (12/06 0930) SpO2:  [93 %-100 %] 100 % (12/06 0656) Weight change:  Last BM Date: 07/31/19  PE: GEN:  NAD, pale ABD:  Soft, non-tender  Lab Results: CBC    Component Value Date/Time   WBC 8.0 07/31/2019 0712   RBC 2.95 (L) 07/31/2019 0712   HGB 9.6 (L) 07/31/2019 0712   HCT 28.1 (L) 07/31/2019 0712   PLT 90 (L) 07/31/2019 0712   MCV 95.3 07/31/2019 0712   MCH 32.5 07/31/2019 0712   MCHC 34.2 07/31/2019 0712   RDW 14.1 07/31/2019 0712   LYMPHSABS 0.8 07/28/2019 0512   MONOABS 0.6 07/28/2019 0512   EOSABS 0.2 07/28/2019 0512   BASOSABS 0.0 07/28/2019 0512   CMP     Component Value Date/Time   NA 137 07/31/2019 0107   K 4.7 07/31/2019 0107   CL 107 07/31/2019 0107   CO2 25 07/31/2019 0107   GLUCOSE 152 (H) 07/31/2019 0107   BUN 15 07/31/2019 0107   CREATININE 0.74 07/31/2019 0107   CALCIUM 6.9 (L) 07/31/2019 0107   PROT 3.7 (L) 07/31/2019 0107   ALBUMIN 1.9 (L) 07/31/2019 0107   AST 20 07/31/2019 0107   ALT 17 07/31/2019 0107   ALKPHOS 50 07/31/2019 0107   BILITOT 0.4 07/31/2019 0107   GFRNONAA >60 07/31/2019 0107   GFRAA >60 07/31/2019 0107   Assessment:  1.  Recurrent melena and anemia. 2.  COVID +  Plan:  1.  Clear liquids. 2.  PPI. 3.  NPO after midnight for possible endoscopy tomorrow. 4.  If worsening bleeding in the meantime, consider tagged RBC study as next step in management. 5.  Risks (bleeding, infection, bowel perforation that could require surgery, sedation-related changes in cardiopulmonary systems), benefits (identification and possible treatment of source of symptoms, exclusion of certain  causes of symptoms), and alternatives (watchful waiting, radiographic imaging studies, empiric medical treatment) of upper endoscopy (EGD) were explained to patient/family in detail and patient wishes to proceed.   Landry Dyke 07/31/2019, 1:44 PM   Cell [phone] If no answer or after 5 PM call [phone]

## 2019-07-31 NOTE — Progress Notes (Addendum)
PROGRESS NOTE    Kelly Phillips  [id number] DOB: [birth year] DOA: 07/26/2019 PCP: No primary care provider on file.   Brief Narrative: 73 year old with past medical history significant for COPD 3 L of oxygen, coronary artery disease, former smoker, lives by herself.  Presents with complaint of generalized weakness and fatigue for a couple of weeks.  He had a near syncopal episode while attempting to use the restroom and presented to the ED.  On questioning she did describe several dark watery bowel movement.  She was pale.  Her hemoglobin was 5.4 on admission. she was transfused 2 units of packed red blood cell.  In the ED she had short run of tachyarrhythmia and her Covid  test came back positive.  EKG shows some inferolateral ST depression.  She was admitted by the critical care team, GI was consulted and recommend supportive care.  Patient care was transitioned to [facility] on 12/3.   Assessment & Plan:   Active Problems:   Upper GI bleed   COPD (chronic obstructive pulmonary disease) (HCC)   COVID-19 virus infection   Acute blood loss anemia   Thrombocytopenia (HCC)  1-Acute blood loss anemia secondary to GI bleed: Patient received 2 unit of packed red blood cell on admission.  Hemoglobin increased to 8.7 post transfusion. Received third unit PRBC 12-03. Hb post transfusion 11 Had  3--4 Black BM earlier this morning. Hb drop to 7. She became pale, hypotensive. Received IV bolus, and another unit PRBC>  Change diet to full liquid, resume IV Protonix.  GI informed of change in condition.  Hb post transfusion 12-06 at 9.6 Addendum 2;30; Patient not feeling well, BP drop to 88, had large blood BM dark blood with small blood clots.  _1 L IV fluids ordered.  -2 units  PRBC> to be transfuse one unit platelet as well.  -Discussed with DR Paulita Fujita, will proceed with Tag red-blood cell scan.  -CCM consulted, for transfer to ICU.  -per nurse patient vomited, coffee ground emesis with blood  50--100. Discussed with GI, place NG tube if significant amount of blood , will proceed with endoscopy, if no proceed with red-blood cell scan.   2-GI bleed; likely upper GI bleed.  No prior history of cirrhosis. Patient reports taking Goody's powder and ibuprofen. She drinks about 4 glasses of wine per week. Continue with IV Protonix. GI recommends support care no plans for endoscopic procedure at this time. Hb at 10. Continue to monitor.  Advance die to regular.   3-Covid PCR positive: Chest x-ray clear. Continue with Remdesivir doses  5/5. Appears to be asymptomatic. Stable.  COVID-19 Labs  Recent Labs    07/29/19 0744 07/30/19 0500 07/31/19 0107  DDIMER 2.88*  --   --   CRP  --  2.0* 1.6*    Lab Results  Component Value Date   SARSCOV2NAA POSITIVE (A) 07/27/2019   ST depression: Inferolateral depression on EKG from [place]. Troponin negative. Keep hemoglobin above 8.  COPD chronic hypoxic respiratory failure on 2 L: Continue with Dulera. Stable.  Hypomagnesemia; replete IV>  Check labs in am.  Thrombocytopenia; related to consupmstion  from GI bleed or viral illness.  Monitor.   Estimated body mass index is 26.05 kg/m as calculated from the following:   Height as of this encounter: 5\' 2"  (1.575 m).   Weight as of this encounter: 64.6 kg.   DVT prophylaxis: SCD, no anticoagulation due to GI bleed.  Code Status: DNR Family Communication:care discussed with patient, will update  son today  Disposition Plan: Remain I the hospital to further  Consultants:   GI  Procedures:   none  Antimicrobials:  none  Subjective: She had 3--4 Black, burgundy color. Last episode at 6 am.  She denies abdominal pain. She is finishing blood transfusion.   Objective: Vitals:   07/31/19 0640 07/31/19 0656 07/31/19 0930 07/31/19 1200  BP: 99/63 95/71 110/84   Pulse: (!) 115 (!) 108 100   Resp: (!) 22 19 15    Temp: 98 F (36.7 C) 97.9 F (36.6 C) 98.5 F (36.9 C)  99.1 F (37.3 C)  TempSrc: Oral Oral Oral Oral  SpO2: 98% 100%    Weight:      Height:        Intake/Output Summary (Last 24 hours) at 07/31/2019 1333 Last data filed at 07/31/2019 0930 Gross per 24 hour  Intake 695 ml  Output 1400 ml  Net -705 ml   Filed Weights   07/27/19 0456 07/28/19 0500 07/30/19 0122  Weight: 58.9 kg 63.3 kg 64.6 kg    Examination:  General exam: NAD Respiratory system: CTA Cardiovascular system: S 1, S 2 RRR Gastrointestinal system: BS present, soft, nt, nd Central nervous system: non focal.  Extremities: Symmetric power  Skin: No rashes   Data Reviewed: I have personally reviewed following labs and imaging studies  CBC: Recent Labs  Lab 07/26/19 2321  07/28/19 0512 07/28/19 1845 07/29/19 0744 07/30/19 0500 07/31/19 0107 07/31/19 0712  WBC 15.2*   < > 7.0  --  8.8 7.2 8.4 8.0  NEUTROABS 12.6*  --  5.2  --   --   --   --   --   HGB 10.3*   < > 7.7* 11.5* 11.3* 10.1* 7.3* 9.6*  HCT 30.7*   < > 23.9* 34.2* 34.2* 31.5* 22.5* 28.1*  MCV 95.0   < > 98.4  --  96.1 98.4 99.1 95.3  PLT 142*   < > 114*  --  PLATELET CLUMPS NOTED ON SMEAR, COUNT APPEARS ADEQUATE 120* 107* 90*   < > = values in this interval not displayed.   Basic Metabolic Panel: Recent Labs  Lab 07/27/19 0017 07/27/19 0444 07/28/19 0941 07/29/19 0744 07/30/19 0500 07/31/19 0107  NA 141 142  --  138 139 137  K 4.1 3.9  --  4.5 4.0 4.7  CL 106 107  --  104 105 107  CO2 26 27  --  26 26 25   GLUCOSE 110* 91  --  86 76 152*  BUN 34* 28*  --  14 10 15   CREATININE 0.78 0.70  --  0.72 0.78 0.74  CALCIUM 8.8* 8.5*  --  8.1* 7.9* 6.9*  MG 1.8 1.8 1.7 1.8 1.9 1.5*  PHOS 4.0 4.1 2.8 3.3 3.1 2.8   GFR: Estimated Creatinine Clearance: 56.9 mL/min (by C-G formula based on SCr of 0.74 mg/dL). Liver Function Tests: Recent Labs  Lab 07/27/19 0017 07/29/19 0744 07/30/19 0500 07/31/19 0107  AST 21 31 26 20   ALT 15 21 21 17   ALKPHOS 48 55 57 50  BILITOT 0.8 0.5 0.4 0.4  PROT  5.2* 5.4* 4.7* 3.7*  ALBUMIN 2.9* 2.6* 2.4* 1.9*   No results for input(s): LIPASE, AMYLASE in the last 168 hours. No results for input(s): AMMONIA in the last 168 hours. Coagulation Profile: Recent Labs  Lab 07/27/19 0017  INR 0.9   Cardiac Enzymes: No results for input(s): CKTOTAL, CKMB, CKMBINDEX, TROPONINI in the last 168 hours. BNP (  last 3 results) No results for input(s): PROBNP in the last 8760 hours. HbA1C: No results for input(s): HGBA1C in the last 72 hours. CBG: Recent Labs  Lab 07/27/19 0016 07/28/19 0727 07/28/19 1112 07/28/19 1617 07/29/19 0752  GLUCAP 114* 94 130* 97 77   Lipid Profile: No results for input(s): CHOL, HDL, LDLCALC, TRIG, CHOLHDL, LDLDIRECT in the last 72 hours. Thyroid Function Tests: No results for input(s): TSH, T4TOTAL, FREET4, T3FREE, THYROIDAB in the last 72 hours. Anemia Panel: No results for input(s): VITAMINB12, FOLATE, FERRITIN, TIBC, IRON, RETICCTPCT in the last 72 hours. Sepsis Labs: Recent Labs  Lab 07/27/19 0017 07/27/19 0248  LATICACIDVEN 0.9 0.9    Recent Results (from the past 240 hour(s))  MRSA PCR Screening     Status: None   Collection Time: 07/26/19 11:21 PM   Specimen: Nasal Mucosa; Nasopharyngeal  Result Value Ref Range Status   MRSA by PCR NEGATIVE NEGATIVE Final    Comment:        The GeneXpert MRSA Assay (FDA approved for NASAL specimens only), is one component of a comprehensive MRSA colonization surveillance program. It is not intended to diagnose MRSA infection nor to guide or monitor treatment for MRSA infections. Performed at [facility], [place] [address], [place], [place] [address]   Culture, blood (routine x 2)     Status: None (Preliminary result)   Collection Time: 07/27/19  1:41 AM   Specimen: BLOOD LEFT HAND  Result Value Ref Range Status   Specimen Description BLOOD LEFT HAND  Final   Special Requests   Final    BOTTLES DRAWN AEROBIC ONLY Blood Culture adequate volume   Culture    Final    NO GROWTH 4 DAYS Performed at [facility], [place] [address], [place], [place] [address]    Report Status PENDING  Incomplete  Culture, blood (routine x 2)     Status: None (Preliminary result)   Collection Time: 07/27/19  1:50 AM   Specimen: BLOOD LEFT HAND  Result Value Ref Range Status   Specimen Description BLOOD LEFT HAND  Final   Special Requests   Final    BOTTLES DRAWN AEROBIC ONLY Blood Culture results may not be optimal due to an inadequate volume of blood received in culture bottles   Culture   Final    NO GROWTH 4 DAYS Performed at [facility], [place] [address] [place], [place] [address]    Report Status PENDING  Incomplete  SARS CORONAVIRUS 2 (TAT 6-24 HRS) Nasopharyngeal Nasopharyngeal Swab     Status: Abnormal   Collection Time: 07/27/19  4:41 PM   Specimen: Nasopharyngeal Swab  Result Value Ref Range Status   SARS Coronavirus 2 POSITIVE (A) NEGATIVE Final    Comment: RESULT CALLED TO, READ BACK BY AND VERIFIED WITH: Hedda Slade, RN AT [phone] ON 07/28/2019 BY SAINVILUS S (NOTE) SARS-CoV-2 target nucleic acids are DETECTED. The SARS-CoV-2 RNA is generally detectable in upper and lower respiratory specimens during the acute phase of infection. Positive results are indicative of the presence of SARS-CoV-2 RNA. Clinical correlation with patient history and other diagnostic information is  necessary to determine patient infection status. Positive results do not rule out bacterial infection or co-infection with other viruses.  The expected result is Negative. Fact Sheet for Patients: SugarRoll.be Fact Sheet for Healthcare Providers: https://www.woods-mathews.com/ This test is not yet approved or cleared by the [place] FDA and  has been authorized for detection and/or diagnosis of SARS-CoV-2  by FDA under an Emergency Use Authorization (EUA). This EUA will remain  in effect (meaning this test can be  used) for  the duration of the COVID-19 declaration under Section 564(b)(1) of the Act, 21 U.S.C. section 360bbb-3(b)(1), unless the authorization is terminated or revoked sooner. Performed at [facility], [place] [address] [place], [place] [address]          Radiology Studies: No results found.      Scheduled Meds: . Chlorhexidine Gluconate Cloth  6 each Topical Q0600  . gabapentin  900 mg Oral QHS  . mometasone-formoterol  2 puff Inhalation BID  . pantoprazole (PROTONIX) IV  40 mg Intravenous Q12H  . polyethylene glycol  17 g Oral Daily  . umeclidinium bromide  1 puff Inhalation Daily   Continuous Infusions:    LOS: 5 days    Time spent: 35 minutes.     Elmarie Shiley, MD [facility]   If 7PM-7AM, please contact night-coverage [email or web address] Password [email or web address] 07/31/2019, 1:33 PM

## 2019-07-31 NOTE — Progress Notes (Signed)
Case discussed with Dr. Tyrell Antonio.  Over the past several hours, patient has had recurrent GI bleeding.  Some melena by report but also some blood clots.  No hematemesis.  Hgb drop.    Since I saw her couple hours ago, Dr. Tyrell Antonio has informed me that patient's BP has dropped and HR increased and she has developed more burgundy bowel movement with blood clots.  Patient getting volume resuscitation, blood transfusion, platelet transfusion.  INR being checked to see whether she has any coagulopathy that needs to be corrected.  I'm not sure whether this is upper GI bleeding or not:  BUN this morning is normal and bowel movements with blood clots is not typical of upper GI bleeding and patient has had no hematemesis.  Patient has oxygen-requiring COPD with tenuous respiratory status.  Patient is being transferred to ICU.  Once stabilized there, I have asked Dr. Tyrell Antonio to obtain tagged RBC study to help localize source of bleeding.  If bleeding seems in upper intestinal tract, and is having ongoing bleeding, would need to consider endoscopy (which would likely require intubation).  If bleeding seems lower intestinal tract, would consider consultation with interventional radiology for consideration of tagged RBC study.  I have discussed case with patient, Dr. Tyrell Antonio, as well as patient's son Evangeline Gula, via phone; [phone]).

## 2019-07-31 NOTE — Progress Notes (Signed)
Patient vomited, question of hematemesis.  Patient believes it looked much like the beef broth that she consumed a few hours earlier today.    Nonetheless, it has somewhat changed the course of our plan of care.  I spoke with the gastroenterologist, Dr. Paulita Fujita, who indicates that he wants to do an NG lavage, to better identify stomach contents.  If there is any appearance of frank blood, coffee-ground emesis, etc. he would like to proceed with endoscopy.  At that point, for protection he would want her intubated, and in a negative pressure room.  If no evidence of blood in the stomach, he would then likely proceed with his tagged red cell study.  Patient's blood pressure remains a somewhat volatile.  She has had pressures that have been in the low 80s over 50s, still with a MAP of 60, and equally blood pressures in the 110s over 60s.  With clinical planning, and these observations I have approached the patient regarding her wishes, especially in light of her DNR status.  Asked about potentially giving her medicines like phenylephrine to support her blood pressure while we try to treat what we believe to be the cause of her hypotension, i.e. her blood loss, she is amenable to that.  She does not want long-term, complicated treatment or significant escalation.  I then asked about intubation for her procedure.  She immediately brought up that she had COPD.  I acknowledged that, and our discussion then turned to essentially stilts trying to treat her for what we believed to be the central problem.  She is amenable to intubation for her procedure, but not longer-term mechanical ventilation.  Therefore if for any reason she cannot wean fairly quickly from a ventilator after EGD (if that procedure goes on to happen), we would invoke her DNR, extubate, and allow nature to take its course.  Bonna Gains MD PhD 07/31/19 5:32 PM

## 2019-07-31 NOTE — H&P (Signed)
  Patient is a 73 year old female Covid positive with severe COPD oxygen dependent.  Presents to the endoscopy unit for EGD because of GI bleed.  Physical alert  Heart regular  Impression: GI bleed  Plan: Emergent EGD

## 2019-07-31 NOTE — Significant Event (Signed)
Rapid Response Event Note  Overview: Follow Up  I came by at 0840 to see the patient. Kelly Phillips was alert and oriented, she stated she felt weak but a little better compared to last night. Skin is quite pale still, she denies not having an appetite this morning, currently getting 1 unit of PRBs. SBP 100s, MAPs 70s, mild tachycardia low 100s. 99% on 2L [place]. Overall not in acute distress. I increased the rate of the blood transfusion to ensure that we are getting the patient resuscitated safely. I suspect she will more blood products today because she is still symptomatic.   I updated the nurse as well. Call RRT as needed   Anothy Bufano R   Start Time 0840 End Time 0900

## 2019-07-31 NOTE — Transfer of Care (Signed)
Immediate Anesthesia Transfer of Care Note  Patient: Kelly Phillips  Procedure(s) Performed: ESOPHAGOGASTRODUODENOSCOPY (EGD) WITH PROPOFOL (N/A ) SCLEROTHERAPY HEMOSTASIS CLIP PLACEMENT  Patient Location: ICU  Anesthesia Type:General  Level of Consciousness: awake, drowsy, patient cooperative and responds to stimulation  Airway & Oxygen Therapy: Patient Spontanous Breathing and Patient connected to face mask oxygen  Post-op Assessment: Report given to RN, Post -op Vital signs reviewed and stable and Patient moving all extremities X 4  Post vital signs: Reviewed and stable  Last Vitals:  Vitals Value Taken Time  BP 143/96 07/31/19 2030  Temp 36.4 C 07/31/19 2020  Pulse 105 07/31/19 2030  Resp 16 07/31/19 2030  SpO2 100 % 07/31/19 2030    Last Pain:  Vitals:   07/31/19 2030  TempSrc:   PainSc: 0-No pain      Patients Stated Pain Goal: 0 ([id number])  Complications: No apparent anesthesia complications

## 2019-07-31 NOTE — Consult Note (Signed)
NAME:  Kelly Phillips, MRN:  [id number], DOB:  1946/08/02, LOS: 5 ADMISSION DATE:  07/26/2019, CONSULTATION DATE:  07/31/2019 REFERRING MD:  Tyrell Antonio, CHIEF COMPLAINT:  hypotension   Brief History   73 year old with past medical history significant for COPD 3 L of oxygen, coronary artery disease, former smoker, lives by herself.  Presents with complaint of generalized weakness and fatigue for a couple of weeks.  He had a near syncopal episode while attempting to use the restroom and presented to the ED.  On questioning she did describe several dark watery bowel movement.  She was pale.  Her hemoglobin was 5.4 on admission. she was transfused 2 units of packed red blood cell.  In the ED she had short run of tachyarrhythmia and her Covid  test came back positive.  EKG shows some inferolateral ST depression.  She was admitted by the critical care team, GI was consulted and recommend supportive care.  Patient care was transitioned to triad on 12/3.  Hypotensive over the course of today (12/6) despite transfusion and fluid challenge. Tranferred back to ICU for closer monitoring.   History of present illness   73 year old with past medical history significant for COPD 3 L of oxygen, coronary artery disease, former smoker, lives by herself.  Presents with complaint of generalized weakness and fatigue for a couple of weeks.  He had a near syncopal episode while attempting to use the restroom and presented to the ED.  On questioning she did describe several dark watery bowel movement.  She was pale.  Her hemoglobin was 5.4 on admission. she was transfused 2 units of packed red blood cell.  In the ED she had short run of tachyarrhythmia and her Covid  test came back positive.  EKG shows some inferolateral ST depression.  She was admitted by the critical care team, GI was consulted and recommend supportive care.  Patient care was transitioned to triad on 12/3.  Hypotensive over the course of today (12/6) despite  transfusion and fluid challenge. Tranferred back to ICU for closer monitoring.   Past Medical History  COPD PAD (s/p aortofem bypass) SMA stenosis  Significant Hospital Events   COVID+ GI bleed   Consults:  PCCM Gastroenterology  Procedures:  CVC placed at referring facility  Significant Diagnostic Tests:  12/5 Blood cx - x 4 days Tagged RBC PENDING  Micro Data:  12/3 COVID + 12/6 other cx neg to date   Antimicrobials:  Remdesivir Day 4    Interim history/subjective:  BP 90s/60s, persisting despite transfusion and fluid challenge. Pt ate today, has some nausea but no vomiting, denies difficulty breathing, lighheadedness, dizziness, chest pain or palpitations. Is interactive and joking with the nurse in her room.   Objective   Blood pressure 90/69, pulse (!) 115, temperature 98.1 F (36.7 C), temperature source Oral, resp. rate 19, height 5\' 2"  (1.575 m), weight 64.6 kg, SpO2 100 %.    FiO2 (%):  [100 %] 100 %   Intake/Output Summary (Last 24 hours) at 07/31/2019 1531 Last data filed at 07/31/2019 1450 Gross per 24 hour  Intake 1010 ml  Output 1400 ml  Net -390 ml   Filed Weights   07/27/19 0456 07/28/19 0500 07/30/19 0122  Weight: 58.9 kg 63.3 kg 64.6 kg    Examination: General: NAD, thin  HENT: AT/Show Low Lungs: expiratory wheeze, mostly clear Cardiovascular: RR, tachy, no r/m/g Abdomen: SND Extremities: no c/c/e Neuro: CNII-XII grossly intact, A/Ox3, approp. cogn. GU: foley in place  Resolved Hospital Problem  list     Assessment & Plan:   GI bleed:  - Protonix BID -Gastroenterology following;  -Tolerating clears -tagged RBC study ordered, and pending  Acute blood loss anemia secondary to above. Hemoglobin 5.4 on admission.  - gradual decline over last few days; retransfused 1 U PRBC this a.m. with appropriate response. 2nd unit already hung when I saw her -transfuse platelets as well.  - post transfusion CBC  COVID: PCR positive from [facility]  12/1.  Chest x-ray is clear - Support O2 PRN - Trend labs - Airborne and contact isolation.  -Consider repeat testing  Hypotension: -transfuse PRBCs, platelets -? Stress response; with no hematemesis, coffee ground and atypical UGIB wil initiate decadron -pt DNR but can discuss goals of care, pressors if needed  COPD /chronic hypoxic respiratory failure on 3 L oxygen - Dulera in lieu of nebs in COVID positive patient.   Best practice:  Diet: clears Pain/Anxiety/Delirium protocol (if indicated): n/a VAP protocol (if indicated): n/a DVT prophylaxis: SCDs GI prophylaxis: PPI BID Glucose control: monitor, SSI if needed Mobility: bed rest Code Status: DNR Family Communication: none yet Disposition: transferred to ICU  Labs   CBC: Recent Labs  Lab 07/26/19 2321  07/28/19 0512 07/28/19 1845 07/29/19 0744 07/30/19 0500 07/31/19 0107 07/31/19 0712  WBC 15.2*   < > 7.0  --  8.8 7.2 8.4 8.0  NEUTROABS 12.6*  --  5.2  --   --   --   --   --   HGB 10.3*   < > 7.7* 11.5* 11.3* 10.1* 7.3* 9.6*  HCT 30.7*   < > 23.9* 34.2* 34.2* 31.5* 22.5* 28.1*  MCV 95.0   < > 98.4  --  96.1 98.4 99.1 95.3  PLT 142*   < > 114*  --  PLATELET CLUMPS NOTED ON SMEAR, COUNT APPEARS ADEQUATE 120* 107* 90*   < > = values in this interval not displayed.    Basic Metabolic Panel: Recent Labs  Lab 07/27/19 0017 07/27/19 0444 07/28/19 0941 07/29/19 0744 07/30/19 0500 07/31/19 0107  NA 141 142  --  138 139 137  K 4.1 3.9  --  4.5 4.0 4.7  CL 106 107  --  104 105 107  CO2 26 27  --  26 26 25   GLUCOSE 110* 91  --  86 76 152*  BUN 34* 28*  --  14 10 15   CREATININE 0.78 0.70  --  0.72 0.78 0.74  CALCIUM 8.8* 8.5*  --  8.1* 7.9* 6.9*  MG 1.8 1.8 1.7 1.8 1.9 1.5*  PHOS 4.0 4.1 2.8 3.3 3.1 2.8   GFR: Estimated Creatinine Clearance: 56.9 mL/min (by C-G formula based on SCr of 0.74 mg/dL). Recent Labs  Lab 07/27/19 0017 07/27/19 0248  07/29/19 0744 07/30/19 0500 07/31/19 0107 07/31/19 0712   WBC  --   --    < > 8.8 7.2 8.4 8.0  LATICACIDVEN 0.9 0.9  --   --   --   --   --    < > = values in this interval not displayed.    Liver Function Tests: Recent Labs  Lab 07/27/19 0017 07/29/19 0744 07/30/19 0500 07/31/19 0107  AST 21 31 26 20   ALT 15 21 21 17   ALKPHOS 48 55 57 50  BILITOT 0.8 0.5 0.4 0.4  PROT 5.2* 5.4* 4.7* 3.7*  ALBUMIN 2.9* 2.6* 2.4* 1.9*   No results for input(s): LIPASE, AMYLASE in the last 168 hours. No results for  input(s): AMMONIA in the last 168 hours.  ABG    Component Value Date/Time   PHART 7.548 (H) 05/11/2015 0740   PCO2ART 37.6 05/11/2015 0740   PO2ART 91.6 05/11/2015 0740   HCO3 32.7 (H) 05/11/2015 0740   TCO2 33.8 05/11/2015 0740   O2SAT 97.6 05/11/2015 0740     Coagulation Profile: Recent Labs  Lab 07/27/19 0017  INR 0.9    Cardiac Enzymes: No results for input(s): CKTOTAL, CKMB, CKMBINDEX, TROPONINI in the last 168 hours.  HbA1C: Hgb A1c MFr Bld  Date/Time Value Ref Range Status  05/17/2015 05:58 AM 6.1 (H) 4.8 - 5.6 % Final    Comment:    (NOTE)         Pre-diabetes: 5.7 - 6.4         Diabetes: >6.4         Glycemic control for adults with diabetes: <7.0     CBG: Recent Labs  Lab 07/27/19 0016 07/28/19 0727 07/28/19 1112 07/28/19 1617 07/29/19 0752  GLUCAP 114* 94 130* 97 77    Review of Systems:   14 point negative except as HPI  Past Medical History  She,  has a past medical history of COPD (chronic obstructive pulmonary disease) ([place]), PAD (peripheral artery disease) ([place]), and SMA stenosis.   Surgical History    Past Surgical History:  Procedure Laterality Date  . AORTO-FEMORAL BYPASS GRAFT       Social History   reports that she has been smoking. She has never used smokeless tobacco.   Family History   Her family history is not on file.   Allergies Allergies  Allergen Reactions  . Codeine Swelling  . Salmon [Fish Allergy] Anaphylaxis and Swelling  . Penicillins Other (See Comments)     Unknown(caregive unsure of allergy)  . Aspirin Tinitus     Home Medications  Prior to Admission medications   Medication Sig Start Date End Date Taking? Authorizing Provider  albuterol (VENTOLIN HFA) 108 (90 Base) MCG/ACT inhaler Inhale 2 puffs into the lungs 2 (two) times daily.  07/10/16  Yes [provider]  ALPRAZolam (XANAX) 0.25 MG tablet Take 0.125-0.25 mg by mouth 2 (two) times daily as needed for anxiety.  07/15/19  Yes [provider]  amLODipine-benazepril (LOTREL) 10-20 MG capsule Take 1 capsule by mouth daily.  10/10/15  Yes [provider]  budesonide (PULMICORT) 180 MCG/ACT inhaler Inhale 1 puff into the lungs 2 (two) times daily.   Yes [provider]  gabapentin (NEURONTIN) 300 MG capsule TAKE 3 CAPSULES BY MOUTH AT BEDTIME 11/28/15  Yes [provider]  omeprazole (PRILOSEC) 40 MG capsule TAKE 1 CAPSULE BY MOUTH EVERY EVENING 04/11/19  Yes [provider]  predniSONE (DELTASONE) 10 MG tablet Take 10-30 mg by mouth See admin instructions. Take 30mg  by mouth daily for 4 days, then take 20mg  daily for 4 days, then take 10mg  daily until gone. 07/20/19  Yes [provider]     Critical care time:  I have independently seen and examined the patient, reviewed data, and developed an assessment and plan. A total of 38 minutes were spent in critical care assessment and medical decision making. This critical care time does not reflect procedure time, or teaching time or supervisory time of PA/NP/Med student/Med Resident, etc but could involve care discussion time.   Bonna Gains, MD PhD 07/31/19 4:05 PM

## 2019-07-31 NOTE — Progress Notes (Signed)
Called by bedside RN after pt became hypotensive, tachycardiac, pale and complained of chest pain that radiated to the left jaw. On assessment, pt is alert/oriented. She is shaking and states that she does not feel well. She denies any chest pain/discomfort, SOB, dizziness at time of examination. Abdominal tenderness and nausea noted  Hypotension - 1L NS fluid bolus given - CBC and CMP ordered stat - Hold any sedating medications (Xanax and Gabapentin)  Chest pain - EKG 12-lead - Troponin stat - negative  This is most likely d/t pt recent upper GI bleed. Will consult GI if symptoms persist. Restart pt on PPI.  Lovey Newcomer, NP [facility] 7p-7a [phone]

## 2019-07-31 NOTE — Progress Notes (Signed)
CRITICAL VALUE ALERT  Critical Value:  hgb 6.7  Date & Time Notied:  07/31/19  1557  Provider Notified: Dr Tyrell Antonio  Orders Received/Actions taken: pt transferred to ICU

## 2019-07-31 NOTE — Op Note (Signed)
[facility] Patient Name: Kelly Phillips Procedure Date : 07/31/2019 MRN: [id number] Attending MD: Wonda Horner , MD Date of Birth: [birth year] CSN: [id number] Age: 73 Admit Type: Inpatient Procedure:                Upper GI endoscopy Indications:              Coffee-ground emesis, Hematemesis, Melena Providers:                Wonda Horner, MD, Ashley Jacobs, RN, Lina Sar, Technician, Valda Lamb CRNA, CRNA Referring MD:              Medicines:                Propofol per Anesthesia Complications:            No immediate complications. Estimated Blood Loss:     Estimated blood loss: none. Procedure:                Pre-Anesthesia Assessment:                           - Prior to the procedure, a History and Physical                            was performed, and patient medications and                            allergies were reviewed. The patient's tolerance of                            previous anesthesia was also reviewed. The risks                            and benefits of the procedure and the sedation                            options and risks were discussed with the patient.                            All questions were answered, and informed consent                            was obtained. Prior Anticoagulants: The patient has                            taken no previous anticoagulant or antiplatelet                            agents. ASA Grade Assessment: IV - A patient with                            severe systemic disease that is a constant threat  to life. After reviewing the risks and benefits,                            the patient was deemed in satisfactory condition to                            undergo the procedure.                           After obtaining informed consent, the endoscope was                            passed under direct vision. Throughout the   procedure, the patient's blood pressure, pulse, and                            oxygen saturations were monitored continuously. The                            [id number]) Olympus gastroscope was                            introduced through the mouth, and advanced to the                            second part of duodenum. The upper GI endoscopy was                            accomplished without difficulty. The patient                            tolerated the procedure well. Scope In: Scope Out: Findings:      The examined esophagus was normal.      One cratered gastric ulcer with a visible vessel was found in the       prepyloric region of the stomach and at the pylorus and extending into       the proximal bulb of duodenum. The lesion was 20 mm in largest       dimension. There was some blood oozing in the crater. Anatomy of       pyloro-duodenal area somewhat distorted due to large ulcer. Area was       successfully injected with 2 mL of a 1:10,000 solution of epinephrine       for hemostasis. For hemostasis, one hemostatic clip was successfully       placed. There was no bleeding at the end of the procedure.      See discription above Impression:               - Normal esophagus.                           - Gastric ulcer with a visible vessel. Injected.                            Clip was placed.                           -  See discription above duodenal ulcers.                           - No specimens collected. Recommendation:           - NPO.                           - Continue present medications.                           - Watch for further bleeding. If significant                            bleeding recurs would call IR for embolization. Procedure Code(s):        --- Professional ---                           [phone], Esophagogastroduodenoscopy, flexible,                            transoral; with control of bleeding, any method Diagnosis Code(s):        ---  Professional ---                           K25.4, Chronic or unspecified gastric ulcer with                            hemorrhage                           K26.9, Duodenal ulcer, unspecified as acute or                            chronic, without hemorrhage or perforation                           K92.0, Hematemesis                           K92.1, Melena (includes Hematochezia) CPT copyright 2019 [employer]. All rights reserved. The codes documented in this report are preliminary and upon coder review may  be revised to meet current compliance requirements. Wonda Horner, MD 07/31/2019 8:34:49 PM This report has been signed electronically. Number of Addenda: 0

## 2019-08-01 ENCOUNTER — Encounter (HOSPITAL_COMMUNITY): Payer: Self-pay | Admitting: Gastroenterology

## 2019-08-01 DIAGNOSIS — D62 Acute posthemorrhagic anemia: Secondary | ICD-10-CM | POA: Diagnosis not present

## 2019-08-01 LAB — CBC
HCT: 27.6 % — ABNORMAL LOW (ref 36.0–46.0)
Hemoglobin: 9.4 g/dL — ABNORMAL LOW (ref 12.0–15.0)
MCH: 28.5 pg (ref 26.0–34.0)
MCHC: 34.1 g/dL (ref 30.0–36.0)
MCV: 83.6 fL (ref 80.0–100.0)
Platelets: 122 10*3/uL — ABNORMAL LOW (ref 150–400)
RBC: 3.3 MIL/uL — ABNORMAL LOW (ref 3.87–5.11)
RDW: 21.2 % — ABNORMAL HIGH (ref 11.5–15.5)
WBC: 17.6 10*3/uL — ABNORMAL HIGH (ref 4.0–10.5)
nRBC: 0.2 % (ref 0.0–0.2)

## 2019-08-01 LAB — BASIC METABOLIC PANEL
Anion gap: 6 (ref 5–15)
BUN: 33 mg/dL — ABNORMAL HIGH (ref 8–23)
CO2: 24 mmol/L (ref 22–32)
Calcium: 6.7 mg/dL — ABNORMAL LOW (ref 8.9–10.3)
Chloride: 106 mmol/L (ref 98–111)
Creatinine, Ser: 0.96 mg/dL (ref 0.44–1.00)
GFR calc Af Amer: >60 mL/min (ref >60)
GFR calc non Af Amer: 60 mL/min — ABNORMAL LOW (ref >60)
Glucose, Bld: 130 mg/dL — ABNORMAL HIGH (ref 70–99)
Potassium: 5.4 mmol/L — ABNORMAL HIGH (ref 3.5–5.1)
Sodium: 136 mmol/L (ref 135–145)

## 2019-08-01 LAB — PREPARE PLATELET PHERESIS: Unit division: 0

## 2019-08-01 LAB — BPAM PLATELET PHERESIS
Blood Product Expiration Date: 202012072359
ISSUE DATE / TIME: 202012062057
Unit Type and Rh: 600

## 2019-08-01 LAB — CULTURE, BLOOD (ROUTINE X 2)
Culture: NO GROWTH
Culture: NO GROWTH
Special Requests: ADEQUATE

## 2019-08-01 LAB — MAGNESIUM: Magnesium: 1.9 mg/dL (ref 1.7–2.4)

## 2019-08-01 LAB — HEMOGLOBIN AND HEMATOCRIT, BLOOD
HCT: 23.4 % — ABNORMAL LOW (ref 36.0–46.0)
Hemoglobin: 8 g/dL — ABNORMAL LOW (ref 12.0–15.0)

## 2019-08-01 MED ORDER — SODIUM CHLORIDE 0.9 % IV SOLN
INTRAVENOUS | Status: DC
  Administered 2019-08-01 – 2019-08-06 (×7): via INTRAVENOUS

## 2019-08-01 NOTE — Plan of Care (Signed)
  Problem: Nutrition: Goal: Adequate nutrition will be maintained Outcome: Progressing Note: Pt will be advanced to a clear liquid diet today. If tolerates well, she can be advanced further to a full liquid diet   Problem: Elimination: Goal: Will not experience complications related to bowel motility Outcome: Progressing Goal: Will not experience complications related to urinary retention Outcome: Progressing   Problem: Pain Managment: Goal: General experience of comfort will improve Outcome: Progressing   Problem: Safety: Goal: Ability to remain free from injury will improve Outcome: Progressing   Problem: Skin Integrity: Goal: Risk for impaired skin integrity will decrease Outcome: Progressing

## 2019-08-01 NOTE — Progress Notes (Signed)
CSW received bed offer for patient for SNF placement at [facility]. CSW spoke with Lattie Haw, RN who reports patient will remain in ICU for a few more days due to her low blood pressures. CSW will inform facility and continue following.  Madilyn Fireman, MSW, LCSW-A Transitions of Care  Clinical Social Worker  [facility]  Medical ICU [phone]

## 2019-08-01 NOTE — Progress Notes (Signed)
PROGRESS NOTE    Kelly Phillips  [id number] DOB: 1946/01/13 DOA: 07/26/2019 PCP: No primary care provider on file.   Brief Narrative: 73 year old with past medical history significant for COPD 3 L of oxygen, coronary artery disease, former smoker, lives by herself.  Presents with complaint of generalized weakness and fatigue for a couple of weeks.  He had a near syncopal episode while attempting to use the restroom and presented to the ED.  On questioning she did describe several dark watery bowel movement.  She was pale.  Her hemoglobin was 5.4 on admission. she was transfused 2 units of packed red blood cell.  In the ED she had short run of tachyarrhythmia and her Covid  test came back positive.  EKG shows some inferolateral ST depression.  She was admitted by the critical care team, GI was consulted and recommend supportive care.  Patient care was transitioned to triad on 12/3.   Assessment & Plan:   Active Problems:   Upper GI bleed   COPD (chronic obstructive pulmonary disease) (HCC)   COVID-19 virus infection   Acute blood loss anemia   Thrombocytopenia (HCC)  1-Acute blood loss anemia secondary to GI bleed: Gastric Ulcer.  -Patient received 2 unit of packed red blood cell on admission.  -Received third unit PRBC 12-03. Hb post transfusion 11 -Early morning 12-06 patient had multiple bowel movement black, became hypotensive, tachycardic.  She received IV bolus, blood transfusion and stabilized.  GI was informed, plan was for endoscopy the following day. -Afternoon of 12- 6 patient became hypotensive again and had a large bloody bowel movement with clots.  She also vomited.  She received 2  unit of blood transfusion.  Transfer to ICU.  NG tube will 400 cc of melena. -Patient underwent endoscopy 12-06 which showed 20 mm large gastric ulcer with visible vessel S/P clip and epinephrine injection.  -Back on Protonix gtt. Continue.  -Clear diet per GI.  -Monitor hb.   2--Covid PCR  positive: Chest x-ray clear. Completed  Remdesivir doses  5/5. Appears to be asymptomatic. Stable.  COVID-19 Labs  Recent Labs    07/30/19 0500 07/31/19 0107  CRP 2.0* 1.6*    Lab Results  Component Value Date   SARSCOV2NAA POSITIVE (A) 07/27/2019   ST depression: Inferolateral depression on EKG from [facility]. Troponin negative. Keep hemoglobin above 8.  COPD chronic hypoxic respiratory failure on 2 L: Continue with Dulera. Stable.  Hypomagnesemia; replaced.   Mild hyperkalemia; slight hemolysis. Repeat labs in am.   Thrombocytopenia; related to consupmstion  from GI bleed or viral illness.  Monitor.  Received one unit Platelet per GI recommendations.   Leukocytosis;  Stress, monitor.   Estimated body mass index is 27.46 kg/m as calculated from the following:   Height as of this encounter: 5\' 2"  (1.575 m).   Weight as of this encounter: 68.1 kg.   DVT prophylaxis: SCD, no anticoagulation due to GI bleed.  Code Status: DNR Family Communication:care discussed with patient, will update. Disposition Plan: Remain I the hospital to further  Consultants:   GI  Procedures:   none  Antimicrobials:  none  Subjective: She is feeling better, she is not as pale as yesterday. She wants to drink water.  She denies dyspnea.   Objective: Vitals:   08/01/19 0600 08/01/19 0700 08/01/19 0800 08/01/19 0803  BP: 93/60 (!) 84/54 101/75   Pulse: (!) 106 (!) 103 (!) 101   Resp: (!) 21 18 20    Temp:  99.4 F (37.4 C)  TempSrc:    Oral  SpO2: 98% 99% 98%   Weight:      Height:        Intake/Output Summary (Last 24 hours) at 08/01/2019 0904 Last data filed at 08/01/2019 0800 Gross per 24 hour  Intake 2287.9 ml  Output 625 ml  Net 1662.9 ml   Filed Weights   07/30/19 0122 07/31/19 1941 08/01/19 0453  Weight: 64.6 kg 64.6 kg 68.1 kg    Examination:  General exam: NAD Respiratory system: CTA Cardiovascular system: S 1, S 2 RRR Gastrointestinal system: BS  present, soft, nt Central nervous system: Non focal.  Extremities: Symmetric power Skin: No rashes   Data Reviewed: I have personally reviewed following labs and imaging studies  CBC: Recent Labs  Lab 07/26/19 2321  07/28/19 0512  07/30/19 0500 07/31/19 0107 07/31/19 0712 07/31/19 1510 08/01/19 0032  WBC 15.2*   < > 7.0   < > 7.2 8.4 8.0 9.3 17.6*  NEUTROABS 12.6*  --  5.2  --   --   --   --   --   --   HGB 10.3*   < > 7.7*   < > 10.1* 7.3* 9.6* 6.7* 9.4*  HCT 30.7*   < > 23.9*   < > 31.5* 22.5* 28.1* 20.8* 27.6*  MCV 95.0   < > 98.4   < > 98.4 99.1 95.3 95.9 83.6  PLT 142*   < > 114*   < > 120* 107* 90* 93* 122*   < > = values in this interval not displayed.   Basic Metabolic Panel: Recent Labs  Lab 07/27/19 0444 07/28/19 0941 07/29/19 0744 07/30/19 0500 07/31/19 0107 08/01/19 0032  NA 142  --  138 139 137 136  K 3.9  --  4.5 4.0 4.7 5.4*  CL 107  --  104 105 107 106  CO2 27  --  26 26 25 24   GLUCOSE 91  --  86 76 152* 130*  BUN 28*  --  14 10 15  33*  CREATININE 0.70  --  0.72 0.78 0.74 0.96  CALCIUM 8.5*  --  8.1* 7.9* 6.9* 6.7*  MG 1.8 1.7 1.8 1.9 1.5* 1.9  PHOS 4.1 2.8 3.3 3.1 2.8  --    GFR: Estimated Creatinine Clearance: 48.6 mL/min (by C-G formula based on SCr of 0.96 mg/dL). Liver Function Tests: Recent Labs  Lab 07/27/19 0017 07/29/19 0744 07/30/19 0500 07/31/19 0107  AST 21 31 26 20   ALT 15 21 21 17   ALKPHOS 48 55 57 50  BILITOT 0.8 0.5 0.4 0.4  PROT 5.2* 5.4* 4.7* 3.7*  ALBUMIN 2.9* 2.6* 2.4* 1.9*   No results for input(s): LIPASE, AMYLASE in the last 168 hours. No results for input(s): AMMONIA in the last 168 hours. Coagulation Profile: Recent Labs  Lab 07/27/19 0017 07/31/19 1504  INR 0.9 1.5*   Cardiac Enzymes: No results for input(s): CKTOTAL, CKMB, CKMBINDEX, TROPONINI in the last 168 hours. BNP (last 3 results) No results for input(s): PROBNP in the last 8760 hours. HbA1C: No results for input(s): HGBA1C in the last 72  hours. CBG: Recent Labs  Lab 07/28/19 0727 07/28/19 1112 07/28/19 1617 07/29/19 0752 07/31/19 1618  GLUCAP 94 130* 97 77 154*   Lipid Profile: No results for input(s): CHOL, HDL, LDLCALC, TRIG, CHOLHDL, LDLDIRECT in the last 72 hours. Thyroid Function Tests: No results for input(s): TSH, T4TOTAL, FREET4, T3FREE, THYROIDAB in the last 72 hours. Anemia  Panel: No results for input(s): VITAMINB12, FOLATE, FERRITIN, TIBC, IRON, RETICCTPCT in the last 72 hours. Sepsis Labs: Recent Labs  Lab 07/27/19 0017 07/27/19 0248  LATICACIDVEN 0.9 0.9    Recent Results (from the past 240 hour(s))  MRSA PCR Screening     Status: None   Collection Time: 07/26/19 11:21 PM   Specimen: Nasal Mucosa; Nasopharyngeal  Result Value Ref Range Status   MRSA by PCR NEGATIVE NEGATIVE Final    Comment:        The GeneXpert MRSA Assay (FDA approved for NASAL specimens only), is one component of a comprehensive MRSA colonization surveillance program. It is not intended to diagnose MRSA infection nor to guide or monitor treatment for MRSA infections. Performed at [facility], [place] [address] [place], [place] [address]   Culture, blood (routine x 2)     Status: None (Preliminary result)   Collection Time: 07/27/19  1:41 AM   Specimen: BLOOD LEFT HAND  Result Value Ref Range Status   Specimen Description BLOOD LEFT HAND  Final   Special Requests   Final    BOTTLES DRAWN AEROBIC ONLY Blood Culture adequate volume   Culture   Final    NO GROWTH 4 DAYS Performed at [facility], [place] [address] [place], [place] [address]    Report Status PENDING  Incomplete  Culture, blood (routine x 2)     Status: None (Preliminary result)   Collection Time: 07/27/19  1:50 AM   Specimen: BLOOD LEFT HAND  Result Value Ref Range Status   Specimen Description BLOOD LEFT HAND  Final   Special Requests   Final    BOTTLES DRAWN AEROBIC ONLY Blood Culture results may not be optimal due to an inadequate  volume of blood received in culture bottles   Culture   Final    NO GROWTH 4 DAYS Performed at [facility], [place] [address] [place], [place] [address]    Report Status PENDING  Incomplete  SARS CORONAVIRUS 2 (TAT 6-24 HRS) Nasopharyngeal Nasopharyngeal Swab     Status: Abnormal   Collection Time: 07/27/19  4:41 PM   Specimen: Nasopharyngeal Swab  Result Value Ref Range Status   SARS Coronavirus 2 POSITIVE (A) NEGATIVE Final    Comment: RESULT CALLED TO, READ BACK BY AND VERIFIED WITH: Hedda Slade, RN AT [phone] ON 07/28/2019 BY SAINVILUS S (NOTE) SARS-CoV-2 target nucleic acids are DETECTED. The SARS-CoV-2 RNA is generally detectable in upper and lower respiratory specimens during the acute phase of infection. Positive results are indicative of the presence of SARS-CoV-2 RNA. Clinical correlation with patient history and other diagnostic information is  necessary to determine patient infection status. Positive results do not rule out bacterial infection or co-infection with other viruses.  The expected result is Negative. Fact Sheet for Patients: SugarRoll.be Fact Sheet for Healthcare Providers: https://www.woods-mathews.com/ This test is not yet approved or cleared by the [place] FDA and  has been authorized for detection and/or diagnosis of SARS-CoV-2 by FDA under an Emergency Use Authorization (EUA). This EUA will remain  in effect (meaning this test can be  used) for the duration of the COVID-19 declaration under Section 564(b)(1) of the Act, 21 U.S.C. section 360bbb-3(b)(1), unless the authorization is terminated or revoked sooner. Performed at [facility], [place] [address], [place], [place] [address]   MRSA PCR Screening     Status: None   Collection Time: 07/31/19  4:20 PM   Specimen: Nasopharyngeal  Result Value Ref Range Status   MRSA by PCR NEGATIVE NEGATIVE Final    Comment:        The GeneXpert MRSA Assay (FDA  approved for NASAL specimens only), is one component of a comprehensive MRSA colonization surveillance program. It is not intended to diagnose MRSA infection nor to guide or monitor treatment for MRSA infections. Performed at [facility], [place] [address], [place], [place] [address]          Radiology Studies: No results found.      Scheduled Meds: . sodium chloride   Intravenous Once  . sodium chloride   Intravenous Once  . Chlorhexidine Gluconate Cloth  6 each Topical Q0600  . gabapentin  900 mg Oral QHS  . mometasone-formoterol  2 puff Inhalation BID  . [START ON 08/04/2019] pantoprazole  40 mg Intravenous Q12H  . polyethylene glycol  17 g Oral Daily  . umeclidinium bromide  1 puff Inhalation Daily   Continuous Infusions: . pantoprozole (PROTONIX) infusion 8 mg/hr (08/01/19 0800)     LOS: 6 days    Time spent: 35 minutes.     Elmarie Shiley, MD [facility]   If 7PM-7AM, please contact night-coverage [email or web address] Password [email or web address] 08/01/2019, 9:04 AM

## 2019-08-01 NOTE — Progress Notes (Signed)
Physical Therapy Treatment Patient Details Name: Kelly Phillips MRN: [id number] DOB: [birth year] Today's Date: 08/01/2019    History of Present Illness 73 year old female with PMH of COPD (chronic obstructive pulmonary disease) on 3L O2, PAD (peripheral artery disease), and SMA stenosis and visual impairment. She lives at home by herself. presented to [facility] ED 12/1 with complaints of generalized weakness and fatigue for a couple weeks. She had a near syncopal episode while attempting to use the restroom, and described several dark watery bowel movements. Hemoglobin discovered to be 5.4. and she was transfused 2 units. ED course otherwise significant for short run on tachyarrhythmia and COVID+. Pt with decr Hgb and incr in bloody stools on 12/6. Underwent EGD and gastric ulcer found and clip placed.     PT Comments    Pt fatigued after further bleeding and egd this weekend. Expect will progress steadily now.    Follow Up Recommendations  SNF     Equipment Recommendations  None recommended by PT    Recommendations for Other Services       Precautions / Restrictions Precautions Precautions: Fall Precaution Comments: COVID+ Restrictions Weight Bearing Restrictions: No    Mobility  Bed Mobility Overal bed mobility: Needs Assistance Bed Mobility: Supine to Sit     Supine to sit: HOB elevated;Min assist     General bed mobility comments: assist to elevate trunk into sitting  Transfers Overall transfer level: Needs assistance Equipment used: 1 person hand held assist Transfers: Sit to/from Stand;Stand Pivot Transfers Sit to Stand: Min assist Stand pivot transfers: Min assist       General transfer comment: Assist to bring hips up and for balance  Ambulation/Gait             General Gait Details: Pt deferred due to feeling too weak and tired.    Stairs             Wheelchair Mobility    Modified Rankin (Stroke Patients Only)       Balance Overall  balance assessment: Needs assistance Sitting-balance support: No upper extremity supported;Feet supported Sitting balance-Leahy Scale: Fair     Standing balance support: Bilateral upper extremity supported Standing balance-Leahy Scale: Poor Standing balance comment: UE support and min assist for static standing                            Cognition Arousal/Alertness: Awake/alert Behavior During Therapy: WFL for tasks assessed/performed Overall Cognitive Status: Within Functional Limits for tasks assessed                                        Exercises      General Comments General comments (skin integrity, edema, etc.): VSS      Pertinent Vitals/Pain Pain Assessment: No/denies pain    Home Living                      Prior Function            PT Goals (current goals can now be found in the care plan section) Acute Rehab PT Goals Patient Stated Goal: go home when she is safe to go home Progress towards PT goals: Not progressing toward goals - comment    Frequency    Min 2X/week      PT Plan Current plan remains appropriate  Co-evaluation              AM-PAC PT "6 Clicks" Mobility   Outcome Measure  Help needed turning from your back to your side while in a flat bed without using bedrails?: A Little Help needed moving from lying on your back to sitting on the side of a flat bed without using bedrails?: A Little Help needed moving to and from a bed to a chair (including a wheelchair)?: A Little Help needed standing up from a chair using your arms (e.g., wheelchair or bedside chair)?: A Little Help needed to walk in hospital room?: Total Help needed climbing 3-5 steps with a railing? : Total 6 Click Score: 14    End of Session Equipment Utilized During Treatment: Oxygen Activity Tolerance: Patient limited by fatigue Patient left: in chair;with chair alarm set;with call bell/phone within reach   PT Visit  Diagnosis: Unsteadiness on feet (R26.81);Other abnormalities of gait and mobility (R26.89);Muscle weakness (generalized) (M62.81);Difficulty in walking, not elsewhere classified (R26.2)     Time: [id number] PT Time Calculation (min) (ACUTE ONLY): 22 min  Charges:  $Therapeutic Activity: 8-22 mins                     [place] Pager [phone] [place] 08/01/2019, 3:39 PM

## 2019-08-01 NOTE — Consult Note (Addendum)
This is a progress note   NAME:  Kelly Phillips, MRN:  [id number], DOB:  [birth year], LOS: 6 ADMISSION DATE:  07/26/2019, CONSULTATION DATE:  07/31/2019 REFERRING MD:  Tyrell Antonio, CHIEF COMPLAINT:  hypotension   Brief History   73 year old with past medical history significant for COPD 3 L of oxygen, coronary artery disease, former smoker, lives by herself.  Presents with complaint of generalized weakness and fatigue for a couple of weeks.  He had a near syncopal episode while attempting to use the restroom and presented to the ED.  On questioning she did describe several dark watery bowel movement.  She was pale.  Her hemoglobin was 5.4 on admission. she was transfused 2 units of packed red blood cell.  In the ED she had short run of tachyarrhythmia and her Covid  test came back positive.  EKG shows some inferolateral ST depression.  She was admitted by the critical care team, GI was consulted and recommend supportive care.  Patient care was transitioned to triad on 12/3.  Hypotensive over the course of today (12/6) despite transfusion and fluid challenge. Tranferred back to ICU for closer monitoring.   History of present illness   73 year old with past medical history significant for COPD 3 L of oxygen, coronary artery disease, former smoker, lives by herself.  Presents with complaint of generalized weakness and fatigue for a couple of weeks.  He had a near syncopal episode while attempting to use the restroom and presented to the ED.  On questioning she did describe several dark watery bowel movement.  She was pale.  Her hemoglobin was 5.4 on admission. she was transfused 2 units of packed red blood cell.  In the ED she had short run of tachyarrhythmia and her Covid  test came back positive.  EKG shows some inferolateral ST depression.  She was admitted by the critical care team, GI was consulted and recommend supportive care.  Patient care was transitioned to triad on 12/3.  Hypotensive over the course  of today (12/6) despite transfusion and fluid challenge. Tranferred back to ICU for closer monitoring.   Past Medical History  COPD PAD (s/p aortofem bypass) SMA stenosis  Significant Hospital Events   COVID+ GI bleed   Consults:  PCCM Gastroenterology  Procedures:  CVC placed at referring facility  Significant Diagnostic Tests:  12/5 Blood cx - x 4 days Tagged RBC PENDING  Micro Data:  12/3 COVID + 12/6 other cx neg to date   Antimicrobials:  Remdesivir Day 4    Interim history/subjective:  No events, BP stable.  Wants something to eat.  Denies pain.  Wants her inhalers.  Objective   Blood pressure 108/83, pulse (!) 105, temperature 99.4 F (37.4 C), temperature source Oral, resp. rate 14, height 5\' 2"  (1.575 m), weight 68.1 kg, SpO2 98 %.    FiO2 (%):  [100 %] 100 %   Intake/Output Summary (Last 24 hours) at 08/01/2019 1107 Last data filed at 08/01/2019 1000 Gross per 24 hour  Intake 1972.86 ml  Output 625 ml  Net 1347.86 ml   Filed Weights   07/30/19 0122 07/31/19 1941 08/01/19 0453  Weight: 64.6 kg 64.6 kg 68.1 kg    Examination: GEN: elderly woman in NAD HEENT: MMM, no thrush CV: RRR, ext warm PULM: Diminished BL, no accessory muscle use GI: Soft, +BS EXT: No edema NEURO: Moves all 4 ext to command PSYCH: RASS SKIN:   Resolved Hospital Problem list     Assessment & Plan:  Acute  UGIB- s/p gastric ulcer clipping, HD stable, H/H looks good, advancing to clear liquid diet Hemorrhagic shock- resolved COVID+ completing remdesivir COPD- not in flare  Dulera for breathing tx, can switch back to nebs on DC Fine to advance diet per GI Progressive mobility Okay to leave ICU from my standpoint Will sign off, call if questions or concerns  Erskine Emery MD PCCM

## 2019-08-01 NOTE — Progress Notes (Signed)
-  Patient not seen.  Chart reviewed.  -Yesterday's notes reviewed.  EGD findings reviewed.  Patient with large prepyloric ulcer extending into the duodenal bulb with visible vessel.  It was treated with epinephrine injection and clip placement.  -Discussed with RN.  No further bleeding.  Hemoglobin stable.  Recommendations ------------------------- -Okay to start clear liquid diet and advance to full liquid as tolerated -Continue Protonix drip for now -Monitor H&H.  Transfuse if hemoglobin less than 8. -If recurrent bleeding, recommend intervention radiology consult for embolization of GDA. -GI will follow from distance.  Otis Brace MD, [place] 08/01/2019, 11:02 AM  Contact #  [phone]

## 2019-08-01 NOTE — Progress Notes (Signed)
Assisted tele visit to patient with son.  Sun Kihn P, RN  

## 2019-08-02 LAB — CBC
HCT: 21.8 % — ABNORMAL LOW (ref 36.0–46.0)
HCT: 23.6 % — ABNORMAL LOW (ref 36.0–46.0)
Hemoglobin: 6.9 g/dL — CL (ref 12.0–15.0)
Hemoglobin: 7.6 g/dL — ABNORMAL LOW (ref 12.0–15.0)
MCH: 28.5 pg (ref 26.0–34.0)
MCH: 29 pg (ref 26.0–34.0)
MCHC: 31.7 g/dL (ref 30.0–36.0)
MCHC: 32.2 g/dL (ref 30.0–36.0)
MCV: 90.1 fL (ref 80.0–100.0)
MCV: 90.1 fL (ref 80.0–100.0)
Platelets: 125 10*3/uL — ABNORMAL LOW (ref 150–400)
Platelets: 132 10*3/uL — ABNORMAL LOW (ref 150–400)
RBC: 2.42 MIL/uL — ABNORMAL LOW (ref 3.87–5.11)
RBC: 2.62 MIL/uL — ABNORMAL LOW (ref 3.87–5.11)
RDW: 22.7 % — ABNORMAL HIGH (ref 11.5–15.5)
RDW: 22.6 % — ABNORMAL HIGH (ref 11.5–15.5)
WBC: 5.7 10*3/uL (ref 4.0–10.5)
WBC: 6.9 10*3/uL (ref 4.0–10.5)
nRBC: 0 % (ref 0.0–0.2)
nRBC: 0 % (ref 0.0–0.2)

## 2019-08-02 LAB — BASIC METABOLIC PANEL
Anion gap: 2 — ABNORMAL LOW (ref 5–15)
BUN: 13 mg/dL (ref 8–23)
CO2: 25 mmol/L (ref 22–32)
Calcium: 7 mg/dL — ABNORMAL LOW (ref 8.9–10.3)
Chloride: 110 mmol/L (ref 98–111)
Creatinine, Ser: 0.73 mg/dL (ref 0.44–1.00)
GFR calc Af Amer: >60 mL/min (ref >60)
GFR calc non Af Amer: >60 mL/min (ref >60)
Glucose, Bld: 85 mg/dL (ref 70–99)
Potassium: 4.4 mmol/L (ref 3.5–5.1)
Sodium: 137 mmol/L (ref 135–145)

## 2019-08-02 LAB — HEMOGLOBIN AND HEMATOCRIT, BLOOD
HCT: 30.8 % — ABNORMAL LOW (ref 36.0–46.0)
HCT: 30.1 % — ABNORMAL LOW (ref 36.0–46.0)
Hemoglobin: 10.1 g/dL — ABNORMAL LOW (ref 12.0–15.0)
Hemoglobin: 10 g/dL — ABNORMAL LOW (ref 12.0–15.0)

## 2019-08-02 LAB — PREPARE RBC (CROSSMATCH)

## 2019-08-02 LAB — PROTIME-INR
INR: 1 (ref 0.8–1.2)
Prothrombin Time: 13.3 seconds (ref 11.4–15.2)

## 2019-08-02 MED ORDER — SODIUM CHLORIDE 0.9% IV SOLUTION: Freq: Once | INTRAVENOUS | Status: DC

## 2019-08-02 MED ORDER — SODIUM CHLORIDE 0.9% FLUSH: 10.0000 mL | INTRAVENOUS | Status: DC | PRN

## 2019-08-02 MED ORDER — SODIUM CHLORIDE 0.9% FLUSH
10.0000 mL | Freq: Two times a day (BID) | INTRAVENOUS | Status: DC
  Administered 2019-08-02 – 2019-08-09 (×10): 10 mL

## 2019-08-02 NOTE — Progress Notes (Signed)
Request to IR for possible arteriogram with embolization due to concern for ongoing bleeding - patient was reviewed by Dr. Pascal Lux and discussed with attending MD today.   Patient has a history of aorto-femoral bypass graft as well as SMA stenting which would likely make endovascular treatment difficult if not technically impossible. Additionally, if the SMA stent were to become occluded this could lead to chronic mesenteric ischemia in the future.  At this time IR recommendations are as follows: - Continue conservative management to include IVF and PRN transfusions to maintain hgb >8.0 as it appears hct has responded to transfusions thus far - Repeat INR in AM and give vitamin K if needed (INR noted to be 1.5 on 12/6 without obvious cause) - Continue to monitor for overt s/s of bleeding. Patient may continue to have melena despite no ongoing bleeding as residual blood may remain in the lower GI tract from previous bleeding. - If patient continues to bleed and/or becomes hemodynamically unstable please contact on call IR MD for re-evaluation - IR will chart check patient tomorrow morning  Please call with questions or concerns.  Candiss Norse, PA-C

## 2019-08-02 NOTE — Progress Notes (Signed)
CRITICAL VALUE ALERT  Critical Value:  Hgb 6.9  Date & Time Notied:  12/08.2020  Provider Notified: Dr. Tyrell Antonio  Orders Received/Actions taken: MD will order a redraw of Hgb

## 2019-08-02 NOTE — Progress Notes (Signed)
-  Patient not seen.  Chart reviewed.  -notes reviewed.  Drop in hemoglobin to 6.9 noted.  Discussed with another RN at bedside.  No evidence of overt bleeding.   Patient with large prepyloric ulcer extending into the duodenal bulb with visible vessel.  It was treated with epinephrine injection and clip placement on 12/6    Recommendations ------------------------- -Transfuse to keep hemoglobin around 8 -Continue clear liquid diet for now -Continue Protonix -If no improvement in hemoglobin with blood transfusion or if evidence of ongoing bleeding, recommend intervention radiology consult for embolization of GDA. -GI will follow from distance.  Otis Brace MD, [place] 08/02/2019, 9:13 AM  Contact #  [phone]

## 2019-08-02 NOTE — Progress Notes (Addendum)
Pt told nurse this evening that she wishes to be a DNR. RN asked patient if there was a particular reason for switching from DNR to FULL Code this morning when she spoke with Dr. Tyrell Antonio. She stated "I go back and forth with whether I want to be a DNR or not." RN spoke with Forrest Moron, NP about the situation and NP will have discussion with patient. This information was passed along to [place], Therapist, sports.

## 2019-08-02 NOTE — Anesthesia Postprocedure Evaluation (Signed)
Anesthesia Post Note  Patient: Kelly Phillips  Procedure(s) Performed: ESOPHAGOGASTRODUODENOSCOPY (EGD) WITH PROPOFOL (N/A ) SCLEROTHERAPY HEMOSTASIS CLIP PLACEMENT     Patient location during evaluation: Endoscopy Anesthesia Type: General Level of consciousness: awake and alert Pain management: pain level controlled Vital Signs Assessment: post-procedure vital signs reviewed and stable Respiratory status: spontaneous breathing, nonlabored ventilation, respiratory function stable and patient connected to nasal cannula oxygen Cardiovascular status: blood pressure returned to baseline and stable Postop Assessment: no apparent nausea or vomiting Anesthetic complications: no    Last Vitals:  Vitals:   08/02/19 0700 08/02/19 0800  BP: 117/69 128/70  Pulse: 86 97  Resp: 20 (!) 21  Temp:    SpO2: 100% 97%    Last Pain:  Vitals:   08/02/19 0800  TempSrc:   PainSc: 0-No pain                 Wylee Ogden S

## 2019-08-02 NOTE — Progress Notes (Signed)
RN paged earlier that patient apparently was "waxing and waning" over her code status. Asked RN to call me next time someone was in the room with the pt (COVID pt). NP spoke to pt who was very adamant about being a DNR. NP confirmed with her that she knew what DNR means. Order changed.  KJKG, NP [facility]

## 2019-08-02 NOTE — Plan of Care (Signed)
  Problem: Clinical Measurements: Goal: Respiratory complications will improve Outcome: Progressing   Problem: Activity: Goal: Risk for activity intolerance will decrease Outcome: Progressing Note: Pt was able to sit up in chair 12/7. Tolerated it well.   Problem: Nutrition: Goal: Adequate nutrition will be maintained Outcome: Progressing Note: Pt tolerating clear liquids well.    Problem: Elimination: Goal: Will not experience complications related to urinary retention Outcome: Progressing   Problem: Pain Managment: Goal: General experience of comfort will improve Outcome: Progressing   Problem: Safety: Goal: Ability to remain free from injury will improve Outcome: Progressing

## 2019-08-02 NOTE — Progress Notes (Signed)
Dr. Tyrell Antonio made aware of patients repeat Hgb of 7.6 (up from morning result of 6.9). MD would like RN to proceed with giving the one unit of PRBCs ordered. MD also stated that a follow-up Hgb isn't necessary unless pt starts to show signs of GI Bleeding.

## 2019-08-02 NOTE — Progress Notes (Signed)
Dr. Barbaraann Cao made aware of follow-up Hgb of 10.1 and INR of 1. Per MD, patient may have ice chips and sips with meds.

## 2019-08-02 NOTE — Progress Notes (Signed)
Assisted tele visit to patient with son.  Jaxsen Bernhart Ann, RN  

## 2019-08-02 NOTE — Progress Notes (Addendum)
PROGRESS NOTE    Kelly Phillips  [id number] DOB: 07-07-1946 DOA: 07/26/2019 PCP: No primary care provider on file.   Brief Narrative: 73 year old with past medical history significant for COPD 3 L of oxygen, coronary artery disease, former smoker, lives by herself.  Presents with complaint of generalized weakness and fatigue for a couple of weeks.  She had a near syncopal episode while attempting to use the restroom and presented to the ED.  On questioning she did describe several dark watery bowel movement.  She was pale.  Her hemoglobin was 5.4 on admission. she was transfused 2 units of packed red blood cell.  In the ED she had short run of tachyarrhythmia and her Covid  test came back positive.  EKG shows some inferolateral ST depression.  She was admitted by the critical care team, GI was consulted and recommend supportive care.  Patient care was transitioned to [facility] on 12/3.  Patient has received at least 5 units of packed red blood cell transfusion during hospitalization.  On the afternoon of 12/6 patient became hypotensive, had large bloody bowel movement, vomited a small amount of blood.  NG tube was placed and he will 400 cc of dark black fluid.  Patient underwent endoscopy which showed large gastric ulcer, status post epinephrine and clip.  Patient hemoglobin today has dropped to 6.9---7.6.  We will proceed with another unit of packed red blood cell.  If hemoglobin does not improve with transfusion or if evidence of bleeding will  need to consult IR for embolization of GDA.  Addendum; patient had maroon color moderate size stool this afternoon. Discussed with GI will consult IR for evaluation of Embolization.  Addendum; Discussed with Dr. Pascal Lux  with IR, patient with prior vascular history of aortobifemoral bypass graft, a stent to the SMA, patient is very complex case for embolization of the GDA and might be higher risk for procedure.  He was recommending to continue to monitor  hemoglobin, blood transfusion, repeat INR, reversal as needed.  if patient continued to bleed to reconsult him tomorrow for evaluation again of GDA embolization.  Or sooner if patient decompensate.    Assessment & Plan:   Active Problems:   Upper GI bleed   COPD (chronic obstructive pulmonary disease) (HCC)   COVID-19 virus infection   Acute blood loss anemia   Thrombocytopenia (HCC)  1-Acute blood loss anemia secondary to GI bleed: Gastric Ulcer.  -Patient received 2 unit of packed red blood cell on admission.  -Received third unit PRBC 12-03. Hb post transfusion 11 -Early morning 12-06 patient had multiple bowel movement black, became hypotensive, tachycardic.  She received IV bolus, blood transfusion and stabilized.  GI was informed, plan was for endoscopy the following day. -Afternoon of 12- 6 patient became hypotensive again and had a large bloody bowel movement with clots.  She also vomited.  She received 2  unit of blood transfusion.  Transfer to ICU.  NG tube will 400 cc of dark , black fluid/  -Patient underwent endoscopy 12-06 which showed 20 mm large gastric ulcer with visible vessel S/P clip and epinephrine injection.  -Back on Protonix gtt. Continue.  -Clear diet per GI.  -Hemoglobin dropped to 6.9, repeat is 7.6.  We will proceed with 1 unit of packed red blood cell.  No melena.  If patient develop melena or if hemoglobin does not improve with blood transfusion will need to consult IR for embolization of  GDA -Addendum; patient had maroon color moderate size stool this  afternoon. Discussed with GI will consult IR for evaluation of Embolization.    2--Covid PCR positive: Chest x-ray clear. Completed  Remdesivir doses  5/5. Appears to be asymptomatic. Stable.  COVID-19 Labs  Recent Labs    07/31/19 0107  CRP 1.6*    Lab Results  Component Value Date   SARSCOV2NAA POSITIVE (A) 07/27/2019   ST depression: Inferolateral depression on EKG from [place]. Troponin  negative. Keep hemoglobin above 8.  COPD chronic hypoxic respiratory failure on 2 L: Continue with Dulera. Stable.  Hypomagnesemia; replaced.   Mild hyperkalemia; slight hemolysis. Repeat labs in am.   Thrombocytopenia; related to consupmstion  from GI bleed or viral illness.  Monitor.  Received one unit Platelet per GI recommendations.   Leukocytosis;  Stress, monitor.   Estimated body mass index is 26.53 kg/m as calculated from the following:   Height as of this encounter: 5\' 2"  (1.575 m).   Weight as of this encounter: 65.8 kg.   DVT prophylaxis: SCD, no anticoagulation due to GI bleed.  Code Status: Patient now wishes to be a full code.  Family Communication:care discussed with patient, son updated 12-08 Disposition Plan: Remain I the hospital to further  Consultants:   GI  Procedures:   none  Antimicrobials:  none  Subjective: She denies any bowel movement since endoscopy.  She reported mild abdominal pain.  She is feeling well.  Objective: Vitals:   08/02/19 0500 08/02/19 0600 08/02/19 0700 08/02/19 0800  BP:  117/81 117/69 128/70  Pulse: 89 93 86 97  Resp: 20 19 20  (!) 21  Temp:      TempSrc:      SpO2: 100% 99% 100% 97%  Weight: 65.8 kg     Height:        Intake/Output Summary (Last 24 hours) at 08/02/2019 0838 Last data filed at 08/02/2019 0800 Gross per 24 hour  Intake 2674.04 ml  Output 1200 ml  Net 1474.04 ml   Filed Weights   07/31/19 1941 08/01/19 0453 08/02/19 0500  Weight: 64.6 kg 68.1 kg 65.8 kg    Examination:  General exam: NAD Respiratory system: CTA Cardiovascular system: S 1, S 2 RRR Gastrointestinal system: BS , present, soft, nt Central nervous system: Non focal.  Extremities: Symmetric power.  Skin: No rashes   Data Reviewed: I have personally reviewed following labs and imaging studies  CBC: Recent Labs  Lab 07/26/19 2321  07/28/19 0512  07/31/19 0107 07/31/19 0712 07/31/19 1510 08/01/19 0032 08/01/19 1136  08/02/19 0545  WBC 15.2*   < > 7.0   < > 8.4 8.0 9.3 17.6*  --  5.7  NEUTROABS 12.6*  --  5.2  --   --   --   --   --   --   --   HGB 10.3*   < > 7.7*   < > 7.3* 9.6* 6.7* 9.4* 8.0* 6.9*  HCT 30.7*   < > 23.9*   < > 22.5* 28.1* 20.8* 27.6* 23.4* 21.8*  MCV 95.0   < > 98.4   < > 99.1 95.3 95.9 83.6  --  90.1  PLT 142*   < > 114*   < > 107* 90* 93* 122*  --  125*   < > = values in this interval not displayed.   Basic Metabolic Panel: Recent Labs  Lab 07/27/19 0444 07/28/19 0941 07/29/19 0744 07/30/19 0500 07/31/19 0107 08/01/19 0032  NA 142  --  138 139 137 136  K 3.9  --  4.5 4.0 4.7 5.4*  CL 107  --  104 105 107 106  CO2 27  --  26 26 25 24   GLUCOSE 91  --  86 76 152* 130*  BUN 28*  --  14 10 15  33*  CREATININE 0.70  --  0.72 0.78 0.74 0.96  CALCIUM 8.5*  --  8.1* 7.9* 6.9* 6.7*  MG 1.8 1.7 1.8 1.9 1.5* 1.9  PHOS 4.1 2.8 3.3 3.1 2.8  --    GFR: Estimated Creatinine Clearance: 47.9 mL/min (by C-G formula based on SCr of 0.96 mg/dL). Liver Function Tests: Recent Labs  Lab 07/27/19 0017 07/29/19 0744 07/30/19 0500 07/31/19 0107  AST 21 31 26 20   ALT 15 21 21 17   ALKPHOS 48 55 57 50  BILITOT 0.8 0.5 0.4 0.4  PROT 5.2* 5.4* 4.7* 3.7*  ALBUMIN 2.9* 2.6* 2.4* 1.9*   No results for input(s): LIPASE, AMYLASE in the last 168 hours. No results for input(s): AMMONIA in the last 168 hours. Coagulation Profile: Recent Labs  Lab 07/27/19 0017 07/31/19 1504  INR 0.9 1.5*   Cardiac Enzymes: No results for input(s): CKTOTAL, CKMB, CKMBINDEX, TROPONINI in the last 168 hours. BNP (last 3 results) No results for input(s): PROBNP in the last 8760 hours. HbA1C: No results for input(s): HGBA1C in the last 72 hours. CBG: Recent Labs  Lab 07/28/19 0727 07/28/19 1112 07/28/19 1617 07/29/19 0752 07/31/19 1618  GLUCAP 94 130* 97 77 154*   Lipid Profile: No results for input(s): CHOL, HDL, LDLCALC, TRIG, CHOLHDL, LDLDIRECT in the last 72 hours. Thyroid Function Tests: No  results for input(s): TSH, T4TOTAL, FREET4, T3FREE, THYROIDAB in the last 72 hours. Anemia Panel: No results for input(s): VITAMINB12, FOLATE, FERRITIN, TIBC, IRON, RETICCTPCT in the last 72 hours. Sepsis Labs: Recent Labs  Lab 07/27/19 0017 07/27/19 0248  LATICACIDVEN 0.9 0.9    Recent Results (from the past 240 hour(s))  MRSA PCR Screening     Status: None   Collection Time: 07/26/19 11:21 PM   Specimen: Nasal Mucosa; Nasopharyngeal  Result Value Ref Range Status   MRSA by PCR NEGATIVE NEGATIVE Final    Comment:        The GeneXpert MRSA Assay (FDA approved for NASAL specimens only), is one component of a comprehensive MRSA colonization surveillance program. It is not intended to diagnose MRSA infection nor to guide or monitor treatment for MRSA infections. Performed at [facility], [place] [address], [place], [place] [address]   Culture, blood (routine x 2)     Status: None   Collection Time: 07/27/19  1:41 AM   Specimen: BLOOD LEFT HAND  Result Value Ref Range Status   Specimen Description BLOOD LEFT HAND  Final   Special Requests   Final    BOTTLES DRAWN AEROBIC ONLY Blood Culture adequate volume   Culture   Final    NO GROWTH 5 DAYS Performed at [facility], [place] [address] [place], [place] [address]    Report Status 08/01/2019 FINAL  Final  Culture, blood (routine x 2)     Status: None   Collection Time: 07/27/19  1:50 AM   Specimen: BLOOD LEFT HAND  Result Value Ref Range Status   Specimen Description BLOOD LEFT HAND  Final   Special Requests   Final    BOTTLES DRAWN AEROBIC ONLY Blood Culture results may not be optimal due to an inadequate volume of blood received in culture bottles   Culture  Final    NO GROWTH 5 DAYS Performed at [facility], [place] [address], [place], [place] [address]    Report Status 08/01/2019 FINAL  Final  SARS CORONAVIRUS 2 (TAT 6-24 HRS) Nasopharyngeal Nasopharyngeal Swab     Status: Abnormal   Collection Time:  07/27/19  4:41 PM   Specimen: Nasopharyngeal Swab  Result Value Ref Range Status   SARS Coronavirus 2 POSITIVE (A) NEGATIVE Final    Comment: RESULT CALLED TO, READ BACK BY AND VERIFIED WITH: Hedda Slade, RN AT [phone] ON 07/28/2019 BY SAINVILUS S (NOTE) SARS-CoV-2 target nucleic acids are DETECTED. The SARS-CoV-2 RNA is generally detectable in upper and lower respiratory specimens during the acute phase of infection. Positive results are indicative of the presence of SARS-CoV-2 RNA. Clinical correlation with patient history and other diagnostic information is  necessary to determine patient infection status. Positive results do not rule out bacterial infection or co-infection with other viruses.  The expected result is Negative. Fact Sheet for Patients: SugarRoll.be Fact Sheet for Healthcare Providers: https://www.woods-mathews.com/ This test is not yet approved or cleared by the [place] FDA and  has been authorized for detection and/or diagnosis of SARS-CoV-2 by FDA under an Emergency Use Authorization (EUA). This EUA will remain  in effect (meaning this test can be  used) for the duration of the COVID-19 declaration under Section 564(b)(1) of the Act, 21 U.S.C. section 360bbb-3(b)(1), unless the authorization is terminated or revoked sooner. Performed at [facility], [place] [address] [place], [place] [address]   MRSA PCR Screening     Status: None   Collection Time: 07/31/19  4:20 PM   Specimen: Nasopharyngeal  Result Value Ref Range Status   MRSA by PCR NEGATIVE NEGATIVE Final    Comment:        The GeneXpert MRSA Assay (FDA approved for NASAL specimens only), is one component of a comprehensive MRSA colonization surveillance program. It is not intended to diagnose MRSA infection nor to guide or monitor treatment for MRSA infections. Performed at [facility], [place] [address] [place], [place] [address]           Radiology Studies: No results found.      Scheduled Meds: . sodium chloride   Intravenous Once  . sodium chloride   Intravenous Once  . sodium chloride   Intravenous Once  . Chlorhexidine Gluconate Cloth  6 each Topical Q0600  . gabapentin  900 mg Oral QHS  . mometasone-formoterol  2 puff Inhalation BID  . [START ON 08/04/2019] pantoprazole  40 mg Intravenous Q12H  . polyethylene glycol  17 g Oral Daily  . umeclidinium bromide  1 puff Inhalation Daily   Continuous Infusions: . sodium chloride 75 mL/hr at 08/02/19 0835  . pantoprozole (PROTONIX) infusion 8 mg/hr (08/02/19 0800)     LOS: 7 days    Time spent: 35 minutes.     Elmarie Shiley, MD [facility]   If 7PM-7AM, please contact night-coverage [email or web address] Password [facility] 08/02/2019, 8:38 AM

## 2019-08-03 DIAGNOSIS — D696 Thrombocytopenia, unspecified: Secondary | ICD-10-CM

## 2019-08-03 DIAGNOSIS — J449 Chronic obstructive pulmonary disease, unspecified: Secondary | ICD-10-CM

## 2019-08-03 LAB — BASIC METABOLIC PANEL
Anion gap: 6 (ref 5–15)
BUN: 5 mg/dL — ABNORMAL LOW (ref 8–23)
CO2: 24 mmol/L (ref 22–32)
Calcium: 7.5 mg/dL — ABNORMAL LOW (ref 8.9–10.3)
Chloride: 111 mmol/L (ref 98–111)
Creatinine, Ser: 0.68 mg/dL (ref 0.44–1.00)
GFR calc Af Amer: >60 mL/min (ref >60)
GFR calc non Af Amer: >60 mL/min (ref >60)
Glucose, Bld: 86 mg/dL (ref 70–99)
Potassium: 4.5 mmol/L (ref 3.5–5.1)
Sodium: 141 mmol/L (ref 135–145)

## 2019-08-03 LAB — CBC
HCT: 29.8 % — ABNORMAL LOW (ref 36.0–46.0)
Hemoglobin: 9.4 g/dL — ABNORMAL LOW (ref 12.0–15.0)
MCH: 29.1 pg (ref 26.0–34.0)
MCHC: 31.5 g/dL (ref 30.0–36.0)
MCV: 92.3 fL (ref 80.0–100.0)
Platelets: 124 10*3/uL — ABNORMAL LOW (ref 150–400)
RBC: 3.23 MIL/uL — ABNORMAL LOW (ref 3.87–5.11)
RDW: 20.4 % — ABNORMAL HIGH (ref 11.5–15.5)
WBC: 5.2 10*3/uL (ref 4.0–10.5)
nRBC: 0.4 % — ABNORMAL HIGH (ref 0.0–0.2)

## 2019-08-03 LAB — HEMOGLOBIN AND HEMATOCRIT, BLOOD
HCT: 28.6 % — ABNORMAL LOW (ref 36.0–46.0)
HCT: 31.4 % — ABNORMAL LOW (ref 36.0–46.0)
HCT: 30.9 % — ABNORMAL LOW (ref 36.0–46.0)
HCT: 31.6 % — ABNORMAL LOW (ref 36.0–46.0)
Hemoglobin: 9 g/dL — ABNORMAL LOW (ref 12.0–15.0)
Hemoglobin: 10 g/dL — ABNORMAL LOW (ref 12.0–15.0)
Hemoglobin: 9.8 g/dL — ABNORMAL LOW (ref 12.0–15.0)
Hemoglobin: 9.9 g/dL — ABNORMAL LOW (ref 12.0–15.0)

## 2019-08-03 NOTE — Progress Notes (Signed)
PROGRESS NOTE    [place] Kelly Phillips  [id number] DOB: 04/04/1946 DOA: 07/26/2019 PCP: No primary care provider on file.   Brief Narrative: Kelly Phillips is a 73 y.o. female with a history of COPD 3 L of oxygen, coronary artery disease, former smoker. She presented secondary to weakness and fatigue and found to have an active upper GI bleed. She is s/p multiple units of PRBC. She underwent EGD which was significant for a large prepyloric ulcer and treated with epinephrine injection/clip placement.   Assessment & Plan:   Active Problems:   Upper GI bleed   COPD (chronic obstructive pulmonary disease) (HCC)   COVID-19 virus infection   Acute blood loss anemia   Thrombocytopenia (HCC)   Acute blood loss anemia Secondary to GI bleed from large prepyloric ulcer. S/p 5 units of PRBC and 1 unit of platelets to date. Hemoglobin responds to blood transfusions. Patient is s/p EGD on 12/6. IR consulted for consideration of embolization of GDA but the will defer for now and recommend managing medically as able; will need to re-consider if continued bleeding or worsening stability. No noted melena per patient, however, documented stool per nursing is "black, type 6" -GI recommendations: Full liquid diet, Protonix drip, H&H q12 hours -Watch for continued melena  COVID-19 infection Patient completed Remdesivir. Asymptomatic. -Continue to monitor for symptoms -Continue Airborne/Contact isolation  COPD Chronic respiratory failure with hypoxia Stable.  Leukocytosis Resolved.  Thrombocytopenia Currently stable.  Hypomagnesemia Resolved. Stable.  Hyperkalemia Transient. Resolved.  ST depression Troponin negative. No chest pain.   DVT prophylaxis: SCDs Code Status:   Code Status: DNR Family Communication: None at bedside Disposition Plan: Discharge pending improvement of GI bleeding and stabilization of hemoglobin   Consultants:   Gastroenterology  Interventional radiology   PCCM  Procedures:   12/6: EGD Impression:               - Normal esophagus.                           - Gastric ulcer with a visible vessel. Injected.                            Clip was placed.                           - See discription above duodenal ulcers.                           - No specimens collected. Recommendation:           - NPO.                           - Continue present medications.                           - Watch for further bleeding. If significant                            bleeding recurs would call IR for embolization.  Antimicrobials:  Remdesivir (12/1>>12/5)    Subjective: Hungry. No melena noticed  Objective: Vitals:   08/03/19 0630 08/03/19 0700 08/03/19 0800 08/03/19 0900  BP: (!) 101/55 (!) 98/59 103/62 102/82  Pulse:  87 91 85 96  Resp: 15 17 16  (!) 24  Temp:   98 F (36.7 C)   TempSrc:   Oral   SpO2: 100% 99% 97% 94%  Weight:      Height:        Intake/Output Summary (Last 24 hours) at 08/03/2019 1118 Last data filed at 08/03/2019 0900 Gross per 24 hour  Intake 3079.02 ml  Output 1900 ml  Net 1179.02 ml   Filed Weights   08/01/19 0453 08/02/19 0500 08/03/19 0431  Weight: 68.1 kg 65.8 kg 66.3 kg    Examination:  General exam: Appears calm and comfortable Respiratory system: Clear to auscultation. Respiratory effort normal. Cardiovascular system: S1 & S2 heard, RRR. No murmurs, rubs, gallops or clicks. Gastrointestinal system: Abdomen is nondistended, soft and nontender. No organomegaly or masses felt. Normal bowel sounds heard. Central nervous system: Alert and oriented. No focal neurological deficits. Extremities: No edema. No calf tenderness Skin: No cyanosis. No rashes Psychiatry: Judgement and insight appear normal. Mood & affect appropriate.     Data Reviewed: I have personally reviewed following labs and imaging studies  CBC: Recent Labs  Lab 07/28/19 0512  07/31/19 1510 08/01/19 0032  08/02/19 0545 08/02/19  1006 08/02/19 1603 08/02/19 2039 08/03/19 0427 08/03/19 0759  WBC 7.0   < > 9.3 17.6*  --  5.7 6.9  --   --  5.2  --   NEUTROABS 5.2  --   --   --   --   --   --   --   --   --   --   HGB 7.7*   < > 6.7* 9.4*   < > 6.9* 7.6* 10.1* 10.0* 9.4* 9.0*  HCT 23.9*   < > 20.8* 27.6*   < > 21.8* 23.6* 30.8* 30.1* 29.8* 28.6*  MCV 98.4   < > 95.9 83.6  --  90.1 90.1  --   --  92.3  --   PLT 114*   < > 93* 122*  --  125* 132*  --   --  124*  --    < > = values in this interval not displayed.   Basic Metabolic Panel: Recent Labs  Lab 07/28/19 0941  07/29/19 0744 07/30/19 0500 07/31/19 0107 08/01/19 0032 08/02/19 0545 08/03/19 0427  NA  --    < > 138 139 137 136 137 141  K  --    < > 4.5 4.0 4.7 5.4* 4.4 4.5  CL  --    < > 104 105 107 106 110 111  CO2  --    < > 26 26 25 24 25 24   GLUCOSE  --    < > 86 76 152* 130* 85 86  BUN  --    < > 14 10 15  33* 13 5*  CREATININE  --    < > 0.72 0.78 0.74 0.96 0.73 0.68  CALCIUM  --    < > 8.1* 7.9* 6.9* 6.7* 7.0* 7.5*  MG 1.7  --  1.8 1.9 1.5* 1.9  --   --   PHOS 2.8  --  3.3 3.1 2.8  --   --   --    < > = values in this interval not displayed.   GFR: Estimated Creatinine Clearance: 57.6 mL/min (by C-G formula based on SCr of 0.68 mg/dL). Liver Function Tests: Recent Labs  Lab 07/29/19 0744 07/30/19 0500 07/31/19 0107  AST 31 26 20   ALT 21  21 17  ALKPHOS 55 57 50  BILITOT 0.5 0.4 0.4  PROT 5.4* 4.7* 3.7*  ALBUMIN 2.6* 2.4* 1.9*   No results for input(s): LIPASE, AMYLASE in the last 168 hours. No results for input(s): AMMONIA in the last 168 hours. Coagulation Profile: Recent Labs  Lab 07/31/19 1504 08/02/19 1603  INR 1.5* 1.0   Cardiac Enzymes: No results for input(s): CKTOTAL, CKMB, CKMBINDEX, TROPONINI in the last 168 hours. BNP (last 3 results) No results for input(s): PROBNP in the last 8760 hours. HbA1C: No results for input(s): HGBA1C in the last 72 hours. CBG: Recent Labs  Lab 07/28/19 0727 07/28/19 1112 07/28/19  1617 07/29/19 0752 07/31/19 1618  GLUCAP 94 130* 97 77 154*   Lipid Profile: No results for input(s): CHOL, HDL, LDLCALC, TRIG, CHOLHDL, LDLDIRECT in the last 72 hours. Thyroid Function Tests: No results for input(s): TSH, T4TOTAL, FREET4, T3FREE, THYROIDAB in the last 72 hours. Anemia Panel: No results for input(s): VITAMINB12, FOLATE, FERRITIN, TIBC, IRON, RETICCTPCT in the last 72 hours. Sepsis Labs: No results for input(s): PROCALCITON, LATICACIDVEN in the last 168 hours.  Recent Results (from the past 240 hour(s))  MRSA PCR Screening     Status: None   Collection Time: 07/26/19 11:21 PM   Specimen: Nasal Mucosa; Nasopharyngeal  Result Value Ref Range Status   MRSA by PCR NEGATIVE NEGATIVE Final    Comment:        The GeneXpert MRSA Assay (FDA approved for NASAL specimens only), is one component of a comprehensive MRSA colonization surveillance program. It is not intended to diagnose MRSA infection nor to guide or monitor treatment for MRSA infections. Performed at [facility], [place] [address] [place], [place] [address]   Culture, blood (routine x 2)     Status: None   Collection Time: 07/27/19  1:41 AM   Specimen: BLOOD LEFT HAND  Result Value Ref Range Status   Specimen Description BLOOD LEFT HAND  Final   Special Requests   Final    BOTTLES DRAWN AEROBIC ONLY Blood Culture adequate volume   Culture   Final    NO GROWTH 5 DAYS Performed at [facility], [place] [address], [place], [place] [address]    Report Status 08/01/2019 FINAL  Final  Culture, blood (routine x 2)     Status: None   Collection Time: 07/27/19  1:50 AM   Specimen: BLOOD LEFT HAND  Result Value Ref Range Status   Specimen Description BLOOD LEFT HAND  Final   Special Requests   Final    BOTTLES DRAWN AEROBIC ONLY Blood Culture results may not be optimal due to an inadequate volume of blood received in culture bottles   Culture   Final    NO GROWTH 5 DAYS Performed at [facility], [place] [address] [place],  [address]    Report Status 08/01/2019 FINAL  Final  SARS CORONAVIRUS 2 (TAT 6-24 HRS) Nasopharyngeal Nasopharyngeal Swab     Status: Abnormal   Collection Time: 07/27/19  4:41 PM   Specimen: Nasopharyngeal Swab  Result Value Ref Range Status   SARS Coronavirus 2 POSITIVE (A) NEGATIVE Final    Comment: RESULT CALLED TO, READ BACK BY AND VERIFIED WITH: Hedda Slade, RN AT [phone] ON 07/28/2019 BY SAINVILUS S (NOTE) SARS-CoV-2 target nucleic acids are DETECTED. The SARS-CoV-2 RNA is generally detectable in upper and lower respiratory specimens during the acute phase of infection. Positive results are indicative of the presence of  SARS-CoV-2 RNA. Clinical correlation with patient history and other diagnostic information is  necessary to determine patient infection status. Positive results do not rule out bacterial infection or co-infection with other viruses.  The expected result is Negative. Fact Sheet for Patients: SugarRoll.be Fact Sheet for Healthcare Providers: https://www.woods-mathews.com/ This test is not yet approved or cleared by the [place] FDA and  has been authorized for detection and/or diagnosis of SARS-CoV-2 by FDA under an Emergency Use Authorization (EUA). This EUA will remain  in effect (meaning this test can be  used) for the duration of the COVID-19 declaration under Section 564(b)(1) of the Act, 21 U.S.C. section 360bbb-3(b)(1), unless the authorization is terminated or revoked sooner. Performed at [facility], [place] [address], [place], [place] [address]   MRSA PCR Screening     Status: None   Collection Time: 07/31/19  4:20 PM   Specimen: Nasopharyngeal  Result Value Ref Range Status   MRSA by PCR NEGATIVE NEGATIVE Final    Comment:        The GeneXpert MRSA Assay (FDA approved for NASAL specimens only), is one component of a comprehensive MRSA colonization  surveillance program. It is not intended to diagnose MRSA infection nor to guide or monitor treatment for MRSA infections. Performed at [facility], [place] [address] [place],  [address]          Radiology Studies: No results found.      Scheduled Meds: . sodium chloride   Intravenous Once  . sodium chloride   Intravenous Once  . sodium chloride   Intravenous Once  . Chlorhexidine Gluconate Cloth  6 each Topical Q0600  . gabapentin  900 mg Oral QHS  . mometasone-formoterol  2 puff Inhalation BID  . [START ON 08/04/2019] pantoprazole  40 mg Intravenous Q12H  . polyethylene glycol  17 g Oral Daily  . sodium chloride flush  10-40 mL Intracatheter Q12H  . umeclidinium bromide  1 puff Inhalation Daily   Continuous Infusions: . sodium chloride 75 mL/hr at 08/03/19 0900  . pantoprozole (PROTONIX) infusion 8 mg/hr (08/03/19 0900)     LOS: 8 days     Cordelia Poche, MD [facility] 08/03/2019, 11:18 AM  If 7PM-7AM, please contact night-coverage [email or web address]

## 2019-08-03 NOTE — Progress Notes (Addendum)
Occupational Therapy Treatment Patient Details Name: Kelly Phillips MRN: [id number] DOB: 11/25/1945 Today's Date: 08/03/2019    History of present illness 73 year old female with PMH of COPD (chronic obstructive pulmonary disease) on 3L O2, PAD (peripheral artery disease), and SMA stenosis and visual impairment. She lives at home by herself. presented to [facility] ED 12/1 with complaints of generalized weakness and fatigue for a couple weeks. She had a near syncopal episode while attempting to use the restroom, and described several dark watery bowel movements. Hemoglobin discovered to be 5.4. and she was transfused 2 units. ED course otherwise significant for short run on tachyarrhythmia and COVID+. Pt with decr Hgb and incr in bloody stools on 12/6. Underwent EGD and gastric ulcer found and clip placed.    OT comments  Pt progressing towards OT goals. Assisted OOB to [facility] for toileting ADL during this session. Pt able to perform functional transfers with and without RW with overall light minA. Pt currently requires maxA for toileting completion. Pt initially on 5L O2 with 6L utilized (via O2 tank) for activity completion, O2 stable with HR up to the high 110s with activity. Feel POC remains appropriate at this time. Will continue to follow acutely.   Follow Up Recommendations  SNF;Supervision/Assistance - 24 hour    Equipment Recommendations  None recommended by OT          Precautions / Restrictions Precautions Precautions: Fall Precaution Comments: COVID+ Restrictions Weight Bearing Restrictions: No       Mobility Bed Mobility Overal bed mobility: Needs Assistance Bed Mobility: Supine to Sit;Sit to Supine     Supine to sit: HOB elevated;Min assist Sit to supine: Min guard   General bed mobility comments: assist to elevate trunk into sitting  Transfers Overall transfer level: Needs assistance Equipment used: 1 person hand held assist;Rolling walker (2 wheeled) Transfers:  Sit to/from [facility] Sit to Stand: Min guard Stand pivot transfers: Min assist       General transfer comment: light assist for balance with transfer to [facility], use of RW for transfer back to EOB, pt able to take pivotal steps and side step along EOB with light minA-close minguard    Balance Overall balance assessment: Needs assistance Sitting-balance support: No upper extremity supported;Feet supported Sitting balance-Leahy Scale: Fair     Standing balance support: Bilateral upper extremity supported;Single extremity supported Standing balance-Leahy Scale: Fair Standing balance comment: close minguard for static standing with single UE support                           ADL either performed or assessed with clinical judgement   ADL Overall ADL's : Needs assistance/impaired                         Toilet Transfer: Minimal assistance;Stand-pivot;BSC;RW Toilet Transfer Details (indicate cue type and reason): light minA, without RW to BSC, using RW transfer back to EOB Toileting- Clothing Manipulation and Hygiene: Maximal assistance;Sit to/from stand;+2 for safety/equipment Toileting - Clothing Manipulation Details (indicate cue type and reason): requires assist for gown management and pericare after BM; +2 present for safety     Functional mobility during ADLs: Minimal assistance;Rolling walker       Vision       Perception     Praxis      Cognition Arousal/Alertness: Awake/alert Behavior During Therapy: WFL for tasks assessed/performed Overall Cognitive Status: Within Functional Limits for tasks  assessed                                          Exercises     Shoulder Instructions       General Comments      Pertinent Vitals/ Pain       Pain Assessment: No/denies pain  Home Living                                          Prior Functioning/Environment              Frequency  Min  2X/week        Progress Toward Goals  OT Goals(current goals can now be found in the care plan section)  Progress towards OT goals: Progressing toward goals  Acute Rehab OT Goals Patient Stated Goal: go home when she is safe to go home OT Goal Formulation: With patient Time For Goal Achievement: 08/12/19 Potential to Achieve Goals: Good ADL Goals Pt Will Perform Grooming: with min guard assist;standing Pt Will Perform Lower Body Bathing: with min assist;sitting/lateral leans;sit to/from stand Pt Will Perform Lower Body Dressing: with min assist;sitting/lateral leans;sit to/from stand Pt Will Transfer to Toilet: with min guard assist;stand pivot transfer;bedside commode Additional ADL Goal #1: Pt will recall and apply 1-3 ECS strategies to apply to BADL Activity  Plan Discharge plan remains appropriate    Co-evaluation                 AM-PAC OT "6 Clicks" Daily Activity     Outcome Measure   Help from another person eating meals?: A Little Help from another person taking care of personal grooming?: A Little Help from another person toileting, which includes using toliet, bedpan, or urinal?: A Lot Help from another person bathing (including washing, rinsing, drying)?: A Lot Help from another person to put on and taking off regular upper body clothing?: A Little Help from another person to put on and taking off regular lower body clothing?: A Lot 6 Click Score: 15    End of Session Equipment Utilized During Treatment: Rolling walker;Oxygen  OT Visit Diagnosis: Unsteadiness on feet (R26.81);Other abnormalities of gait and mobility (R26.89);Muscle weakness (generalized) (M62.81)   Activity Tolerance Patient tolerated treatment well   Patient Left in bed;with call bell/phone within reach;with nursing/sitter in room   Nurse Communication Mobility status        Time: [id number] OT Time Calculation (min): 25 min  Charges: OT General Charges $OT Visit: 1 Visit OT  Treatments $Self Care/Home Management : 23-37 mins  Lou Cal, [place] Pager [phone] Office [phone]   Raymondo Band 08/03/2019, 4:38 PM

## 2019-08-03 NOTE — Progress Notes (Signed)
-  Patient not seen.   -Spoke with the patient over the phone with the help of RN.  Patient denies any abdominal pain, nausea or vomiting.  She had one episode of dark stool yesterday but no bleeding overnight but this morning.  Also discussed with RN.  -Patient was discussed with medicine team yesterday, because of drop in hemoglobin interventional radiology was consulted.  According to radiology, mesenteric arteriogram would be technically challenging given patients  history of aortobifemoral bypass as well as previous SMA stenting.    Patient with large prepyloric ulcer extending into the duodenal bulb with visible vessel.  It was treated with epinephrine injection and clip placement on 12/6    Recommendations ------------------------- -Hemoglobin improved with blood transfusion. -Okay to start full liquid diet -Continue Protonix drip for now -Monitor H&H every 12 hours -Appreciate intervention radiology input.  They will consider arteriogram if failed medical management. -GI will follow  Otis Brace MD, [place] 08/03/2019, 8:56 AM  Contact #  [phone]

## 2019-08-03 NOTE — Progress Notes (Addendum)
-  Received call from RN that patient had few episodes of bloody bowel movements since this morning. -Repeat H&H 30 minutes ago showed stable hemoglobin around 10.  - D/W Dr. Pascal Lux IR   -Recommend continuation of conservative management for now. -Repeat H&H every 6 hours - call IR if hemodynamic instability   Otis Brace MD, FACP 08/03/2019, 1:29 PM  Contact #  [phone]

## 2019-08-04 LAB — BPAM RBC
Blood Product Expiration Date: 202012092359
Blood Product Expiration Date: 202012112359
Blood Product Expiration Date: 202012112359
Blood Product Expiration Date: 202012112359
Blood Product Expiration Date: 202012112359
ISSUE DATE / TIME: 202012060623
ISSUE DATE / TIME: 202012061445
ISSUE DATE / TIME: 202012061720
ISSUE DATE / TIME: 202012081230
Unit Type and Rh: 9500
Unit Type and Rh: 9500
Unit Type and Rh: 9500
Unit Type and Rh: 9500
Unit Type and Rh: 9500

## 2019-08-04 LAB — TYPE AND SCREEN
ABO/RH(D): O POS
Antibody Screen: NEGATIVE
Unit division: 0
Unit division: 0
Unit division: 0
Unit division: 0
Unit division: 0

## 2019-08-04 LAB — HEMOGLOBIN AND HEMATOCRIT, BLOOD
HCT: 26.6 % — ABNORMAL LOW (ref 36.0–46.0)
HCT: 34.9 % — ABNORMAL LOW (ref 36.0–46.0)
HCT: 28.3 % — ABNORMAL LOW (ref 36.0–46.0)
Hemoglobin: 8.3 g/dL — ABNORMAL LOW (ref 12.0–15.0)
Hemoglobin: 10.8 g/dL — ABNORMAL LOW (ref 12.0–15.0)
Hemoglobin: 9 g/dL — ABNORMAL LOW (ref 12.0–15.0)

## 2019-08-04 LAB — CBC
HCT: 30.5 % — ABNORMAL LOW (ref 36.0–46.0)
Hemoglobin: 9.4 g/dL — ABNORMAL LOW (ref 12.0–15.0)
MCH: 29.2 pg (ref 26.0–34.0)
MCHC: 30.8 g/dL (ref 30.0–36.0)
MCV: 94.7 fL (ref 80.0–100.0)
Platelets: 129 10*3/uL — ABNORMAL LOW (ref 150–400)
RBC: 3.22 MIL/uL — ABNORMAL LOW (ref 3.87–5.11)
RDW: 20.1 % — ABNORMAL HIGH (ref 11.5–15.5)
WBC: 5.2 10*3/uL (ref 4.0–10.5)
nRBC: 0 % (ref 0.0–0.2)

## 2019-08-04 LAB — GLUCOSE, CAPILLARY: Glucose-Capillary: 117 mg/dL — ABNORMAL HIGH (ref 70–99)

## 2019-08-04 MED ORDER — SODIUM CHLORIDE 0.9 % IV SOLN
8.0000 mg/h | INTRAVENOUS | Status: DC
  Administered 2019-08-04 – 2019-08-06 (×6): 8 mg/h via INTRAVENOUS
  Filled 2019-08-04 (×5): qty 80

## 2019-08-04 NOTE — Progress Notes (Signed)
Physical Therapy Treatment Patient Details Name: Kelly Phillips MRN: [id number] DOB: [birth year] Today's Date: 08/04/2019    History of Present Illness 73 year old female with PMH of COPD (chronic obstructive pulmonary disease) on 3L O2, PAD (peripheral artery disease), and SMA stenosis and visual impairment. She lives at home by herself. presented to [facility] ED 12/1 with complaints of generalized weakness and fatigue for a couple weeks. She had a near syncopal episode while attempting to use the restroom, and described several dark watery bowel movements. Hemoglobin discovered to be 5.4. and she was transfused 2 units. ED course otherwise significant for short run on tachyarrhythmia and COVID+. Pt with decr Hgb and incr in bloody stools on 12/6. Underwent EGD and gastric ulcer found and clip placed.     PT Comments    Pt making good progress. Continue to recommend SNF prior to return home.    Follow Up Recommendations  SNF     Equipment Recommendations  None recommended by PT    Recommendations for Other Services       Precautions / Restrictions Precautions Precautions: Fall Precaution Comments: COVID+ Restrictions Weight Bearing Restrictions: No    Mobility  Bed Mobility               General bed mobility comments: Pt up in chair  Transfers Overall transfer level: Needs assistance Equipment used: Rolling walker (2 wheeled) Transfers: Sit to/from [facility] Sit to Stand: Min assist         General transfer comment: Assist to bring hips up and for balance. Verbal cues for hand placement  Ambulation/Gait Ambulation/Gait assistance: Min guard Gait Distance (Feet): 90 Feet Assistive device: Rolling walker (2 wheeled) Gait Pattern/deviations: Step-through pattern;Decreased stride length Gait velocity: decr Gait velocity interpretation: <1.31 ft/sec, indicative of household ambulator General Gait Details: Assist for safety and lines. Pt amb on 3L  of O2 with VSS.    Stairs             Wheelchair Mobility    Modified Rankin (Stroke Patients Only)       Balance Overall balance assessment: Needs assistance Sitting-balance support: No upper extremity supported;Feet supported Sitting balance-Leahy Scale: Fair     Standing balance support: Bilateral upper extremity supported Standing balance-Leahy Scale: Poor Standing balance comment: walker and supervision for static standing                            Cognition Arousal/Alertness: Awake/alert Behavior During Therapy: WFL for tasks assessed/performed Overall Cognitive Status: Within Functional Limits for tasks assessed                                        Exercises      General Comments        Pertinent Vitals/Pain      Home Living                      Prior Function            PT Goals (current goals can now be found in the care plan section) Acute Rehab PT Goals Patient Stated Goal: go home when she is safe to go home Progress towards PT goals: Progressing toward goals    Frequency    Min 2X/week      PT Plan Current plan remains appropriate  Co-evaluation              AM-PAC PT "6 Clicks" Mobility   Outcome Measure  Help needed turning from your back to your side while in a flat bed without using bedrails?: A Little Help needed moving from lying on your back to sitting on the side of a flat bed without using bedrails?: A Little Help needed moving to and from a bed to a chair (including a wheelchair)?: A Little Help needed standing up from a chair using your arms (e.g., wheelchair or bedside chair)?: A Little Help needed to walk in hospital room?: A Little Help needed climbing 3-5 steps with a railing? : Total 6 Click Score: 16    End of Session Equipment Utilized During Treatment: Oxygen;Gait belt Activity Tolerance: Patient tolerated treatment well Patient left: in chair;with chair  alarm set;with call bell/phone within reach Nurse Communication: Mobility status PT Visit Diagnosis: Unsteadiness on feet (R26.81);Other abnormalities of gait and mobility (R26.89);Muscle weakness (generalized) (M62.81);Difficulty in walking, not elsewhere classified (R26.2)     Time: [id number] PT Time Calculation (min) (ACUTE ONLY): 18 min  Charges:  $Gait Training: 8-22 mins                     [place] Pager [phone] Office [place] 08/04/2019, 3:31 PM

## 2019-08-04 NOTE — Progress Notes (Signed)
[facility] Gastroenterology Progress Note  Kelly Phillips 73 y.o. 06/28/1946  CC: GI bleed, ulcer disease   Subjective: Patient seen and examined at bedside briefly.  She denies any abdominal pain, blood in the stool or black stool.  Discussed with RN, currently having brown-colored stool.  ROS : Negative for chest pain and shortness of breath.   Objective: Vital signs in last 24 hours: Vitals:   08/04/19 1030 08/04/19 1100  BP: 115/64 118/83  Pulse: 94 92  Resp: 20 20  Temp:    SpO2: 100% 100%    Physical Exam:  General.  Well developed, not in acute distress.  Very pleasant Abdomen.  Soft, nontender, nondistended Psych.  Mood and affect normal Neuro.  Alert/oriented x3  Lab Results: Recent Labs    08/02/19 0545 08/03/19 0427  NA 137 141  K 4.4 4.5  CL 110 111  CO2 25 24  GLUCOSE 85 86  BUN 13 5*  CREATININE 0.73 0.68  CALCIUM 7.0* 7.5*   No results for input(s): AST, ALT, ALKPHOS, BILITOT, PROT, ALBUMIN in the last 72 hours. Recent Labs    08/03/19 0427 08/04/19 0218 08/04/19 0536  WBC 5.2  --  5.2  HGB 9.4* 8.3* 9.4*  HCT 29.8* 26.6* 30.5*  MCV 92.3  --  94.7  PLT 124*  --  129*   Recent Labs    08/02/19 1603  LABPROT 13.3  INR 1.0      Assessment/Plan: -Bleeding large prepyloric ulcer extending into the duodenal bulb.  Status post endoscopic treatment. -Acute blood loss anemia -COVID-19 infection  Recommendations ----------------------- -Patient denies any further bleeding.  Hemoglobin stable. -Resume Protonix drip given her increased risk of recurrent bleeding. -Appreciate IR evaluation. -Start full liquid diet, advance as tolerated -GI will follow from distance.  Monitor H&H   Otis Brace MD, FACP 08/04/2019, 11:12 AM  Contact #  [phone]

## 2019-08-04 NOTE — Progress Notes (Signed)
PROGRESS NOTE    Kelly Phillips  [id number] DOB: November 05, 1945 DOA: 07/26/2019 PCP: No primary care provider on file.   Brief Narrative: Kelly Phillips is a 73 y.o. female with a history of COPD 3 L of oxygen, coronary artery disease, former smoker. She presented secondary to weakness and fatigue and found to have an active upper GI bleed. She is s/p multiple units of PRBC. She underwent EGD which was significant for a large prepyloric ulcer and treated with epinephrine injection/clip placement.   Assessment & Plan:   Active Problems:   Upper GI bleed   COPD (chronic obstructive pulmonary disease) (HCC)   COVID-19 virus infection   Acute blood loss anemia   Thrombocytopenia (HCC)   Acute blood loss anemia Secondary to GI bleed from large prepyloric ulcer. S/p 5 units of PRBC and 1 unit of platelets to date. Hemoglobin responds to blood transfusions. Patient is s/p EGD on 12/6. IR consulted for consideration of embolization of GDA but the will defer for now and recommend managing medically as able; will need to re-consider if continued bleeding or worsening stability. BM have become increasingly more brown. Hemoglobin is stable. -GI recommendations: Clear liquid diet, Protonix IV, recommendations pending today -Watch for continued melena -Will likely transfer to the medical floor pending GI recommendations today  COVID-19 infection Patient completed Remdesivir. Did not receive Decadron. Asymptomatic. -Continue to monitor for symptoms -Continue Airborne/Contact isolation  COPD Chronic respiratory failure with hypoxia Stable. On 3L of oxygen via [place] -Continue Albuterol prn, Incruse Ellipta, Dulera  Leukocytosis Resolved.  Thrombocytopenia Currently stable.  Hypomagnesemia Resolved. Stable.  Hyperkalemia Transient. Resolved.  ST depression Troponin negative. No chest pain. Outpatient follow-up   DVT prophylaxis: SCDs Code Status:   Code Status: DNR Family  Communication: None at bedside Disposition Plan: Discharge pending improvement of GI bleeding and stabilization of hemoglobin   Consultants:   Gastroenterology  Interventional radiology  PCCM  Procedures:   12/6: EGD Impression:               - Normal esophagus.                           - Gastric ulcer with a visible vessel. Injected.                            Clip was placed.                           - See discription above duodenal ulcers.                           - No specimens collected. Recommendation:           - NPO.                           - Continue present medications.                           - Watch for further bleeding. If significant                            bleeding recurs would call IR for embolization.  Antimicrobials:  Remdesivir (12/1>>12/5)    Subjective: Hungry for real  food. Brown stools developed yesterday afternoon.  Objective: Vitals:   08/04/19 0730 08/04/19 0800 08/04/19 0830 08/04/19 0930  BP: 117/84 (!) 107/37 94/74 116/68  Pulse: 89 (!) 101 (!) 101 99  Resp: (!) 21 17 (!) 24 20  Temp:      TempSrc:      SpO2: 100% 99% 100% 98%  Weight:      Height:        Intake/Output Summary (Last 24 hours) at 08/04/2019 0957 Last data filed at 08/04/2019 0900 Gross per 24 hour  Intake 3237.73 ml  Output 2710 ml  Net 527.73 ml   Filed Weights   08/02/19 0500 08/03/19 0431 08/04/19 0457  Weight: 65.8 kg 66.3 kg 67.2 kg    Examination:  General exam: Appears calm and comfortable Respiratory system: Clear to auscultation. Respiratory effort normal. Cardiovascular system: S1 & S2 heard, RRR. No murmurs, rubs, gallops or clicks. Gastrointestinal system: Abdomen is nondistended, soft and nontender. No organomegaly or masses felt. Normal bowel sounds heard. Central nervous system: Alert and oriented. No focal neurological deficits. Extremities: No edema. No calf tenderness Skin: No cyanosis. No rashes Psychiatry: Judgement and  insight appear normal. Mood & affect appropriate.      Data Reviewed: I have personally reviewed following labs and imaging studies  CBC: Recent Labs  Lab 08/01/19 0032 08/02/19 0545 08/02/19 1006 08/03/19 0427 08/03/19 1252 08/03/19 1600 08/03/19 2002 08/04/19 0218 08/04/19 0536  WBC 17.6* 5.7 6.9 5.2  --   --   --   --  5.2  HGB 9.4* 6.9* 7.6* 9.4* 10.0* 9.8* 9.9* 8.3* 9.4*  HCT 27.6* 21.8* 23.6* 29.8* 31.4* 30.9* 31.6* 26.6* 30.5*  MCV 83.6 90.1 90.1 92.3  --   --   --   --  94.7  PLT 122* 125* 132* 124*  --   --   --   --  [id number]*   Basic Metabolic Panel: Recent Labs  Lab 07/29/19 0744 07/30/19 0500 07/31/19 0107 08/01/19 0032 08/02/19 0545 08/03/19 0427  NA 138 139 137 136 137 141  K 4.5 4.0 4.7 5.4* 4.4 4.5  CL 104 105 107 106 110 111  CO2 26 26 25 24 25 24   GLUCOSE 86 76 152* 130* 85 86  BUN 14 10 15  33* 13 5*  CREATININE 0.72 0.78 0.74 0.96 0.73 0.68  CALCIUM 8.1* 7.9* 6.9* 6.7* 7.0* 7.5*  MG 1.8 1.9 1.5* 1.9  --   --   PHOS 3.3 3.1 2.8  --   --   --    GFR: Estimated Creatinine Clearance: 57.9 mL/min (by C-G formula based on SCr of 0.68 mg/dL). Liver Function Tests: Recent Labs  Lab 07/29/19 0744 07/30/19 0500 07/31/19 0107  AST 31 26 20   ALT 21 21 17   ALKPHOS 55 57 50  BILITOT 0.5 0.4 0.4  PROT 5.4* 4.7* 3.7*  ALBUMIN 2.6* 2.4* 1.9*   No results for input(s): LIPASE, AMYLASE in the last 168 hours. No results for input(s): AMMONIA in the last 168 hours. Coagulation Profile: Recent Labs  Lab 07/31/19 1504 08/02/19 1603  INR 1.5* 1.0   Cardiac Enzymes: No results for input(s): CKTOTAL, CKMB, CKMBINDEX, TROPONINI in the last 168 hours. BNP (last 3 results) No results for input(s): PROBNP in the last 8760 hours. HbA1C: No results for input(s): HGBA1C in the last 72 hours. CBG: Recent Labs  Lab 07/28/19 1112 07/28/19 1617 07/29/19 0752 07/31/19 1618  GLUCAP 130* 97 77 154*   Lipid Profile: No results  for input(s): CHOL, HDL,  LDLCALC, TRIG, CHOLHDL, LDLDIRECT in the last 72 hours. Thyroid Function Tests: No results for input(s): TSH, T4TOTAL, FREET4, T3FREE, THYROIDAB in the last 72 hours. Anemia Panel: No results for input(s): VITAMINB12, FOLATE, FERRITIN, TIBC, IRON, RETICCTPCT in the last 72 hours. Sepsis Labs: No results for input(s): PROCALCITON, LATICACIDVEN in the last 168 hours.  Recent Results (from the past 240 hour(s))  MRSA PCR Screening     Status: None   Collection Time: 07/26/19 11:21 PM   Specimen: Nasal Mucosa; Nasopharyngeal  Result Value Ref Range Status   MRSA by PCR NEGATIVE NEGATIVE Final    Comment:        The GeneXpert MRSA Assay (FDA approved for NASAL specimens only), is one component of a comprehensive MRSA colonization surveillance program. It is not intended to diagnose MRSA infection nor to guide or monitor treatment for MRSA infections. Performed at [facility], [place] [address] [place], [place] [address]   Culture, blood (routine x 2)     Status: None   Collection Time: 07/27/19  1:41 AM   Specimen: BLOOD LEFT HAND  Result Value Ref Range Status   Specimen Description BLOOD LEFT HAND  Final   Special Requests   Final    BOTTLES DRAWN AEROBIC ONLY Blood Culture adequate volume   Culture   Final    NO GROWTH 5 DAYS Performed at [facility], [place] [address], [place], [place] [address]    Report Status 08/01/2019 FINAL  Final  Culture, blood (routine x 2)     Status: None   Collection Time: 07/27/19  1:50 AM   Specimen: BLOOD LEFT HAND  Result Value Ref Range Status   Specimen Description BLOOD LEFT HAND  Final   Special Requests   Final    BOTTLES DRAWN AEROBIC ONLY Blood Culture results may not be optimal due to an inadequate volume of blood received in culture bottles   Culture   Final    NO GROWTH 5 DAYS Performed at [facility], [place] [address], [place], [place] [address]    Report Status 08/01/2019 FINAL  Final  SARS CORONAVIRUS 2 (TAT  6-24 HRS) Nasopharyngeal Nasopharyngeal Swab     Status: Abnormal   Collection Time: 07/27/19  4:41 PM   Specimen: Nasopharyngeal Swab  Result Value Ref Range Status   SARS Coronavirus 2 POSITIVE (A) NEGATIVE Final    Comment: RESULT CALLED TO, READ BACK BY AND VERIFIED WITH: Hedda Slade, RN AT [phone] ON 07/28/2019 BY SAINVILUS S (NOTE) SARS-CoV-2 target nucleic acids are DETECTED. The SARS-CoV-2 RNA is generally detectable in upper and lower respiratory specimens during the acute phase of infection. Positive results are indicative of the presence of SARS-CoV-2 RNA. Clinical correlation with patient history and other diagnostic information is  necessary to determine patient infection status. Positive results do not rule out bacterial infection or co-infection with other viruses.  The expected result is Negative. Fact Sheet for Patients: SugarRoll.be Fact Sheet for Healthcare Providers: https://www.woods-mathews.com/ This test is not yet approved or cleared by the [place] FDA and  has been authorized for detection and/or diagnosis of SARS-CoV-2 by FDA under an Emergency Use Authorization (EUA). This EUA will remain  in effect (meaning this test can be  used) for the duration of the COVID-19 declaration under Section 564(b)(1) of the Act, 21 U.S.C. section 360bbb-3(b)(1), unless the authorization is terminated or revoked sooner. Performed at [facility], [place]  [address], [place], [place] [address]   MRSA PCR Screening     Status: None   Collection Time: 07/31/19  4:20 PM   Specimen: Nasopharyngeal  Result Value Ref Range Status   MRSA by PCR NEGATIVE NEGATIVE Final    Comment:        The GeneXpert MRSA Assay (FDA approved for NASAL specimens only), is one component of a comprehensive MRSA colonization surveillance program. It is not intended to diagnose MRSA infection nor to guide or monitor treatment for MRSA infections.  Performed at [facility], [place] [address], [place], [place] [address]          Radiology Studies: No results found.      Scheduled Meds: . sodium chloride   Intravenous Once  . sodium chloride   Intravenous Once  . sodium chloride   Intravenous Once  . Chlorhexidine Gluconate Cloth  6 each Topical Q0600  . gabapentin  900 mg Oral QHS  . mometasone-formoterol  2 puff Inhalation BID  . pantoprazole  40 mg Intravenous Q12H  . polyethylene glycol  17 g Oral Daily  . sodium chloride flush  10-40 mL Intracatheter Q12H  . umeclidinium bromide  1 puff Inhalation Daily   Continuous Infusions: . sodium chloride 75 mL/hr at 08/04/19 0900     LOS: 9 days     Cordelia Poche, MD [facility] 08/04/2019, 9:57 AM  If 7PM-7AM, please contact night-coverage [email or web address]

## 2019-08-05 LAB — CBC
HCT: 29.6 % — ABNORMAL LOW (ref 36.0–46.0)
Hemoglobin: 9.4 g/dL — ABNORMAL LOW (ref 12.0–15.0)
MCH: 30 pg (ref 26.0–34.0)
MCHC: 31.8 g/dL (ref 30.0–36.0)
MCV: 94.6 fL (ref 80.0–100.0)
Platelets: 127 10*3/uL — ABNORMAL LOW (ref 150–400)
RBC: 3.13 MIL/uL — ABNORMAL LOW (ref 3.87–5.11)
RDW: 20 % — ABNORMAL HIGH (ref 11.5–15.5)
WBC: 4.8 10*3/uL (ref 4.0–10.5)
nRBC: 0 % (ref 0.0–0.2)

## 2019-08-05 LAB — HEMOGLOBIN AND HEMATOCRIT, BLOOD
HCT: 31.4 % — ABNORMAL LOW (ref 36.0–46.0)
HCT: 29.5 % — ABNORMAL LOW (ref 36.0–46.0)
Hemoglobin: 9.9 g/dL — ABNORMAL LOW (ref 12.0–15.0)
Hemoglobin: 9.3 g/dL — ABNORMAL LOW (ref 12.0–15.0)

## 2019-08-05 NOTE — Progress Notes (Signed)
-  Patient not seen.  Chart reviewed.  Hemoglobin stable.  No reported bleeding episodes.   Patient with large prepyloric ulcer extending into the duodenal bulb with visible vessel.  It was treated with epinephrine injection and clip placement on 12/6   -  According to radiology, mesenteric arteriogram would be technically challenging given patients  history of aortobifemoral bypass as well as previous SMA stenting.  Recommendations ------------------------- -Advance diet to soft -Monitor H&H -Change Protonix drip to Protonix IV twice daily tomorrow if hemoglobin stable.  Recommend IV twice a day PPI while in the hospital.  Recommend discharging home on Protonix 40 mg twice a day for 4 weeks, followed by Protonix 40 mg once a day for another 4 weeks.  -Appreciate intervention radiology input.  They will consider arteriogram if failed medical management. -GI will sign off.  Call us back if needed  Otis Brace MD, [place] 08/05/2019, 11:41 AM  Contact #  [phone]

## 2019-08-05 NOTE — Progress Notes (Signed)
PROGRESS NOTE    Kelly Phillips  [id number] DOB: [birth year] DOA: 07/26/2019 PCP: No primary care provider on file.   Brief Narrative: Kelly Phillips is a 73 y.o. female with a history of COPD 3 L of oxygen, coronary artery disease, former smoker. She presented secondary to weakness and fatigue and found to have an active upper GI bleed. She is s/p multiple units of PRBC. She underwent EGD which was significant for a large prepyloric ulcer and treated with epinephrine injection/clip placement.   Assessment & Plan:   Active Problems:   Upper GI bleed   COPD (chronic obstructive pulmonary disease) (HCC)   COVID-19 virus infection   Acute blood loss anemia   Thrombocytopenia (HCC)   Acute blood loss anemia Secondary to GI bleed from large prepyloric ulcer. S/p 5 units of PRBC and 1 unit of platelets to date. Hemoglobin responds to blood transfusions. Patient is s/p EGD on 12/6. IR consulted for consideration of embolization of GDA but the will defer for now and recommend managing medically as able; will need to re-consider if continued bleeding or worsening stability. BM have become increasingly more brown. Hemoglobin is stable.  -GI recommendations: Soft diet, Protonix IV, discharge on Protonix 40 mg BID x4 weeks, then 40 mg daily for 4 weeks -Watch for continued melena  COVID-19 infection Patient completed Remdesivir. Did not receive Decadron. Asymptomatic. -Continue to monitor for symptoms -Continue Airborne/Contact isolation  COPD Chronic respiratory failure with hypoxia Stable. On 3L of oxygen via [place] -Continue Albuterol prn, Incruse Ellipta, Dulera  Leukocytosis Resolved.  Thrombocytopenia Currently stable.  Hypomagnesemia Resolved. Stable.  Hyperkalemia Transient. Resolved.  ST depression Troponin negative. No chest pain. Outpatient follow-up   DVT prophylaxis: SCDs Code Status:   Code Status: DNR Family Communication: None at bedside Disposition Plan:  Discharge pending improvement of GI bleeding and stabilization of hemoglobin   Consultants:   Gastroenterology  Interventional radiology  PCCM  Procedures:   12/6: EGD Impression:               - Normal esophagus.                           - Gastric ulcer with a visible vessel. Injected.                            Clip was placed.                           - See discription above duodenal ulcers.                           - No specimens collected. Recommendation:           - NPO.                           - Continue present medications.                           - Watch for further bleeding. If significant                            bleeding recurs would call IR for embolization.  Antimicrobials:  Remdesivir (12/1>>12/5)    Subjective: Wants food.  Otherwise, no issues.  Objective: Vitals:   08/05/19 0305 08/05/19 0511 08/05/19 0515 08/05/19 0515  BP: (!) 139/100   132/87  Pulse: (!) 113   94  Resp:      Temp: 97.9 F (36.6 C)  97.6 F (36.4 C)   TempSrc: Oral     SpO2: 100%   98%  Weight:  72.7 kg    Height:        Intake/Output Summary (Last 24 hours) at 08/05/2019 1123 Last data filed at 08/05/2019 1103 Gross per 24 hour  Intake 914.1 ml  Output 1800 ml  Net -885.9 ml   Filed Weights   08/03/19 0431 08/04/19 0457 08/05/19 0511  Weight: 66.3 kg 67.2 kg 72.7 kg    Examination:  General exam: Appears calm and comfortable Respiratory system: Clear to auscultation. Respiratory effort normal. Cardiovascular system: S1 & S2 heard, RRR. No murmurs, rubs, gallops or clicks. Gastrointestinal system: Abdomen is nondistended, soft and nontender. No organomegaly or masses felt. Normal bowel sounds heard. Central nervous system: Alert and oriented. No focal neurological deficits. Extremities: No edema. No calf tenderness Skin: No cyanosis. No rashes Psychiatry: Judgement and insight appear normal. Mood & affect appropriate.    Data Reviewed: I have  personally reviewed following labs and imaging studies  CBC: Recent Labs  Lab 08/02/19 0545 08/02/19 1006 08/03/19 0427 08/04/19 0536 08/04/19 1317 08/04/19 2257 08/05/19 0819 08/05/19 0944  WBC 5.7 6.9 5.2 5.2  --   --  4.8  --   HGB 6.9* 7.6* 9.4* 9.4* 10.8* 9.0* 9.4* 9.9*  HCT 21.8* 23.6* 29.8* 30.5* 34.9* 28.3* 29.6* 31.4*  MCV 90.1 90.1 92.3 94.7  --   --  94.6  --   PLT 125* 132* 124* 129*  --   --  127*  --    Basic Metabolic Panel: Recent Labs  Lab 07/30/19 0500 07/31/19 0107 08/01/19 0032 08/02/19 0545 08/03/19 0427  NA 139 137 136 137 141  K 4.0 4.7 5.4* 4.4 4.5  CL 105 107 106 110 111  CO2 26 25 24 25 24   GLUCOSE 76 152* 130* 85 86  BUN 10 15 33* 13 5*  CREATININE 0.78 0.74 0.96 0.73 0.68  CALCIUM 7.9* 6.9* 6.7* 7.0* 7.5*  MG 1.9 1.5* 1.9  --   --   PHOS 3.1 2.8  --   --   --    GFR: Estimated Creatinine Clearance: 60.2 mL/min (by C-G formula based on SCr of 0.68 mg/dL). Liver Function Tests: Recent Labs  Lab 07/30/19 0500 07/31/19 0107  AST 26 20  ALT 21 17  ALKPHOS 57 50  BILITOT 0.4 0.4  PROT 4.7* 3.7*  ALBUMIN 2.4* 1.9*   No results for input(s): LIPASE, AMYLASE in the last 168 hours. No results for input(s): AMMONIA in the last 168 hours. Coagulation Profile: Recent Labs  Lab 07/31/19 1504 08/02/19 1603  INR 1.5* 1.0   Cardiac Enzymes: No results for input(s): CKTOTAL, CKMB, CKMBINDEX, TROPONINI in the last 168 hours. BNP (last 3 results) No results for input(s): PROBNP in the last 8760 hours. HbA1C: No results for input(s): HGBA1C in the last 72 hours. CBG: Recent Labs  Lab 07/31/19 1618 08/04/19 1950  GLUCAP 154* 117*   Lipid Profile: No results for input(s): CHOL, HDL, LDLCALC, TRIG, CHOLHDL, LDLDIRECT in the last 72 hours. Thyroid Function Tests: No results for input(s): TSH, T4TOTAL, FREET4, T3FREE, THYROIDAB in the last 72 hours. Anemia Panel: No results for input(s): VITAMINB12, FOLATE, FERRITIN, TIBC,  IRON,  RETICCTPCT in the last 72 hours. Sepsis Labs: No results for input(s): PROCALCITON, LATICACIDVEN in the last 168 hours.  Recent Results (from the past 240 hour(s))  MRSA PCR Screening     Status: None   Collection Time: 07/26/19 11:21 PM   Specimen: Nasal Mucosa; Nasopharyngeal  Result Value Ref Range Status   MRSA by PCR NEGATIVE NEGATIVE Final    Comment:        The GeneXpert MRSA Assay (FDA approved for NASAL specimens only), is one component of a comprehensive MRSA colonization surveillance program. It is not intended to diagnose MRSA infection nor to guide or monitor treatment for MRSA infections. Performed at [facility], [place] [address], [place], [place] [address]   Culture, blood (routine x 2)     Status: None   Collection Time: 07/27/19  1:41 AM   Specimen: BLOOD LEFT HAND  Result Value Ref Range Status   Specimen Description BLOOD LEFT HAND  Final   Special Requests   Final    BOTTLES DRAWN AEROBIC ONLY Blood Culture adequate volume   Culture   Final    NO GROWTH 5 DAYS Performed at [facility], [place] [address] [place], [place] [address]    Report Status 08/01/2019 FINAL  Final  Culture, blood (routine x 2)     Status: None   Collection Time: 07/27/19  1:50 AM   Specimen: BLOOD LEFT HAND  Result Value Ref Range Status   Specimen Description BLOOD LEFT HAND  Final   Special Requests   Final    BOTTLES DRAWN AEROBIC ONLY Blood Culture results may not be optimal due to an inadequate volume of blood received in culture bottles   Culture   Final    NO GROWTH 5 DAYS Performed at [facility], [place] [address], [place], [place] [address]    Report Status 08/01/2019 FINAL  Final  SARS CORONAVIRUS 2 (TAT 6-24 HRS) Nasopharyngeal Nasopharyngeal Swab     Status: Abnormal   Collection Time: 07/27/19  4:41 PM   Specimen: Nasopharyngeal Swab  Result Value Ref Range Status   SARS Coronavirus 2 POSITIVE (A) NEGATIVE Final    Comment: RESULT CALLED TO,  READ BACK BY AND VERIFIED WITH: Hedda Slade, RN AT [phone] ON 07/28/2019 BY SAINVILUS S (NOTE) SARS-CoV-2 target nucleic acids are DETECTED. The SARS-CoV-2 RNA is generally detectable in upper and lower respiratory specimens during the acute phase of infection. Positive results are indicative of the presence of SARS-CoV-2 RNA. Clinical correlation with patient history and other diagnostic information is  necessary to determine patient infection status. Positive results do not rule out bacterial infection or co-infection with other viruses.  The expected result is Negative. Fact Sheet for Patients: SugarRoll.be Fact Sheet for Healthcare Providers: https://www.woods-mathews.com/ This test is not yet approved or cleared by the [place] FDA and  has been authorized for detection and/or diagnosis of SARS-CoV-2 by FDA under an Emergency Use Authorization (EUA). This EUA will remain  in effect (meaning this test can be  used) for the duration of the COVID-19 declaration under Section 564(b)(1) of the Act, 21 U.S.C. section 360bbb-3(b)(1), unless the authorization is terminated or revoked sooner. Performed at [facility], [place] [address] [place], [place] [address]   MRSA PCR Screening     Status: None   Collection Time: 07/31/19  4:20 PM   Specimen: Nasopharyngeal  Result Value Ref Range Status   MRSA by PCR NEGATIVE NEGATIVE  Final    Comment:        The GeneXpert MRSA Assay (FDA approved for NASAL specimens only), is one component of a comprehensive MRSA colonization surveillance program. It is not intended to diagnose MRSA infection nor to guide or monitor treatment for MRSA infections. Performed at [facility], [place] [address], [place], [place] [address]          Radiology Studies: No results found.      Scheduled Meds: . sodium chloride   Intravenous Once  . sodium chloride   Intravenous Once  . sodium chloride    Intravenous Once  . Chlorhexidine Gluconate Cloth  6 each Topical Q0600  . gabapentin  900 mg Oral QHS  . mometasone-formoterol  2 puff Inhalation BID  . polyethylene glycol  17 g Oral Daily  . sodium chloride flush  10-40 mL Intracatheter Q12H  . umeclidinium bromide  1 puff Inhalation Daily   Continuous Infusions: . sodium chloride 75 mL/hr at 08/05/19 0253  . pantoprozole (PROTONIX) infusion 8 mg/hr (08/05/19 0948)     LOS: 10 days     Cordelia Poche, MD [facility] 08/05/2019, 11:23 AM  If 7PM-7AM, please contact night-coverage [email or web address]

## 2019-08-05 NOTE — Progress Notes (Signed)
Physical Therapy Treatment Patient Details Name: Kelly Phillips MRN: [id number] DOB: Aug 01, 1946 Today's Date: 08/05/2019    History of Present Illness 73 year old female with PMH of COPD (chronic obstructive pulmonary disease) on 3L O2, PAD (peripheral artery disease), and SMA stenosis and visual impairment. She lives at home by herself. presented to [facility] ED 12/1 with complaints of generalized weakness and fatigue for a couple weeks. She had a near syncopal episode while attempting to use the restroom, and described several dark watery bowel movements. Hemoglobin discovered to be 5.4. and she was transfused 2 units. ED course otherwise significant for short run on tachyarrhythmia and COVID+. Pt with decr Hgb and incr in bloody stools on 12/6. Underwent EGD and gastric ulcer found and clip placed.     PT Comments    Pt reports she had just returned to bed after sitting up and requests not to walk far today. Pt agreeable to walk to sink to brush teeth. Pt limited in safe mobility by decreased strength and endurance. Pt supervision for bed mobility and min guard for transfers and ambulation with RW. After ambulating to sink pt agreeable to walk to door and back to far side of bed to get in. Pt with increased fatigue and 3/4 DoE on way back from door and request to return to near side of bed. D/c plans remain appropriate despite pt's great desire to return to her home however she lacks the stamina for safe mobilization and self care. PT will continue to follow acutely   Follow Up Recommendations  SNF     Equipment Recommendations  None recommended by PT       Precautions / Restrictions Precautions Precautions: Fall Precaution Comments: COVID+ Restrictions Weight Bearing Restrictions: No    Mobility  Bed Mobility Overal bed mobility: Needs Assistance Bed Mobility: Supine to Sit;Sit to Supine     Supine to sit: Supervision Sit to supine: Supervision   General bed mobility comments:  supervision for safety   Transfers Overall transfer level: Needs assistance Equipment used: Rolling walker (2 wheeled) Transfers: Sit to/from [facility] Sit to Stand: Min guard         General transfer comment: min guard for safety, increased time and effort  Ambulation/Gait Ambulation/Gait assistance: Min guard Gait Distance (Feet): 18 Feet Assistive device: Rolling walker (2 wheeled) Gait Pattern/deviations: Step-through pattern;Decreased stride length;Trunk flexed Gait velocity: decr Gait velocity interpretation: <1.31 ft/sec, indicative of household ambulator General Gait Details: Assist for safety and lines. Ambulated to sink to brush teeth, afterward continued to walk towards door and turned around, goals was to walk to far side of bed before getting back to bed however became very fatigued and requested return to bed       Balance Overall balance assessment: Needs assistance Sitting-balance support: No upper extremity supported;Feet supported Sitting balance-Leahy Scale: Fair     Standing balance support: Bilateral upper extremity supported Standing balance-Leahy Scale: Poor Standing balance comment: walker and supervision for static standing                            Cognition Arousal/Alertness: Awake/alert Behavior During Therapy: WFL for tasks assessed/performed Overall Cognitive Status: Within Functional Limits for tasks assessed  General Comments General comments (skin integrity, edema, etc.): On 3 L O2 via [name], pt with 3/4 DoE with ambulation in room O2 monitor not easily accessible       Pertinent Vitals/Pain Pain Assessment: No/denies pain           PT Goals (current goals can now be found in the care plan section) Acute Rehab PT Goals Patient Stated Goal: go home when she is safe to go home PT Goal Formulation: With patient Time For Goal Achievement:  08/12/19 Potential to Achieve Goals: Good Progress towards PT goals: Progressing toward goals    Frequency    Min 2X/week      PT Plan Current plan remains appropriate       AM-PAC PT "6 Clicks" Mobility   Outcome Measure  Help needed turning from your back to your side while in a flat bed without using bedrails?: A Little Help needed moving from lying on your back to sitting on the side of a flat bed without using bedrails?: A Little Help needed moving to and from a bed to a chair (including a wheelchair)?: A Little Help needed standing up from a chair using your arms (e.g., wheelchair or bedside chair)?: A Little Help needed to walk in hospital room?: A Little Help needed climbing 3-5 steps with a railing? : Total 6 Click Score: 16    End of Session Equipment Utilized During Treatment: Oxygen;Gait belt Activity Tolerance: Patient tolerated treatment well Patient left: in chair;with chair alarm set;with call bell/phone within reach Nurse Communication: Mobility status PT Visit Diagnosis: Unsteadiness on feet (R26.81);Other abnormalities of gait and mobility (R26.89);Muscle weakness (generalized) (M62.81);Difficulty in walking, not elsewhere classified (R26.2)     Time: [id number] PT Time Calculation (min) (ACUTE ONLY): 30 min  Charges:  $Gait Training: 8-22 mins $Therapeutic Activity: 8-22 mins                     Kelly Phillips B. Migdalia Dk PT, DPT Acute Rehabilitation Services Pager [phone] Office [phone]    [place] 08/05/2019, 4:51 PM

## 2019-08-06 LAB — CBC
HCT: 28.9 % — ABNORMAL LOW (ref 36.0–46.0)
Hemoglobin: 9 g/dL — ABNORMAL LOW (ref 12.0–15.0)
MCH: 29.7 pg (ref 26.0–34.0)
MCHC: 31.1 g/dL (ref 30.0–36.0)
MCV: 95.4 fL (ref 80.0–100.0)
Platelets: 140 10*3/uL — ABNORMAL LOW (ref 150–400)
RBC: 3.03 MIL/uL — ABNORMAL LOW (ref 3.87–5.11)
RDW: 19.5 % — ABNORMAL HIGH (ref 11.5–15.5)
WBC: 5 10*3/uL (ref 4.0–10.5)
nRBC: 0 % (ref 0.0–0.2)

## 2019-08-06 MED ORDER — PANTOPRAZOLE SODIUM 40 MG IV SOLR
40.0000 mg | Freq: Two times a day (BID) | INTRAVENOUS | Status: DC
  Administered 2019-08-06 – 2019-08-09 (×7): 40 mg via INTRAVENOUS
  Filled 2019-08-06 (×7): qty 40

## 2019-08-06 MED ORDER — ORAL CARE MOUTH RINSE
15.0000 mL | Freq: Two times a day (BID) | OROMUCOSAL | Status: DC
  Administered 2019-08-07 – 2019-08-08 (×4): 15 mL via OROMUCOSAL

## 2019-08-06 NOTE — Plan of Care (Signed)
  Problem: Education: ?Goal: Knowledge of General Education information will improve ?Description: Including pain rating scale, medication(s)/side effects and non-pharmacologic comfort measures ?Outcome: Progressing ?  ?Problem: Clinical Measurements: ?Goal: Will remain free from infection ?Outcome: Progressing ?Goal: Respiratory complications will improve ?Outcome: Progressing ?  ?Problem: Activity: ?Goal: Risk for activity intolerance will decrease ?Outcome: Progressing ?  ?Problem: Coping: ?Goal: Level of anxiety will decrease ?Outcome: Progressing ?  ?

## 2019-08-06 NOTE — Progress Notes (Signed)
PROGRESS NOTE    Kelly Phillips  [id number] DOB: 1946/03/08 DOA: 07/26/2019 PCP: No primary care provider on file.   Brief Narrative: Kelly Phillips is a 73 y.o. female with a history of COPD 3 L of oxygen, coronary artery disease, former smoker. She presented secondary to weakness and fatigue and found to have an active upper GI bleed. She is s/p multiple units of PRBC. She underwent EGD which was significant for a large prepyloric ulcer and treated with epinephrine injection/clip placement.   Assessment & Plan:   Active Problems:   Upper GI bleed   COPD (chronic obstructive pulmonary disease) (HCC)   COVID-19 virus infection   Acute Phillips loss anemia   Thrombocytopenia (HCC)   Acute Phillips loss anemia Secondary to GI bleed from large prepyloric ulcer. S/p 5 units of PRBC and 1 unit of platelets to date. Hemoglobin responds to Phillips transfusions. Patient is s/p EGD on 12/6. IR consulted for consideration of embolization of GDA but the will defer for now and recommend managing medically as able; will need to re-consider if continued bleeding or worsening stability. BM have become increasingly more brown. Hemoglobin is stable.  -GI recommendations: Soft diet, Protonix IV, discharge on Protonix 40 mg BID x4 weeks, then 40 mg daily for 4 weeks -Watch for continued melena  COVID-19 infection Patient completed Remdesivir. Did not receive Decadron. Asymptomatic. -Continue to monitor for symptoms -Continue Airborne/Contact isolation  COPD Chronic respiratory failure with hypoxia Stable. On 3L of oxygen via [place] -Continue Albuterol prn, Incruse Ellipta, Dulera  Leukocytosis Resolved.  Thrombocytopenia Currently stable.  Hypomagnesemia Resolved. Stable.  Hyperkalemia Transient. Resolved.  ST depression Troponin negative. No chest pain. Outpatient follow-up   DVT prophylaxis: SCDs Code Status:   Code Status: DNR Family Communication: Son on telephone Disposition Plan:  Discharge to SNF. Medically stable for discharge.   Consultants:   Gastroenterology  Interventional radiology  PCCM  Procedures:   12/6: EGD Impression:               - Normal esophagus.                           - Gastric ulcer with a visible vessel. Injected.                            Clip was placed.                           - See discription above duodenal ulcers.                           - No specimens collected. Recommendation:           - NPO.                           - Continue present medications.                           - Watch for further bleeding. If significant                            bleeding recurs would call IR for embolization.  Antimicrobials:  Remdesivir (12/1>>12/5)    Subjective: No issues overnight. No melena.  Objective: Vitals:   08/05/19 1622 08/05/19 1625 08/05/19 2205 08/06/19 0530  BP: (!) 152/92 131/89 (!) 151/102 (!) 147/98  Pulse:  97 (!) 103 99  Resp:  18 20 20   Temp: (!) 97.4 F (36.3 C) 97.8 F (36.6 C) 98.6 F (37 C) 98.4 F (36.9 C)  TempSrc: Oral Oral Oral Oral  SpO2:  100% 100% 99%  Weight:      Height:        Intake/Output Summary (Last 24 hours) at 08/06/2019 0855 Last data filed at 08/06/2019 0600 Gross per 24 hour  Intake 2526.69 ml  Output 3300 ml  Net -773.31 ml   Filed Weights   08/03/19 0431 08/04/19 0457 08/05/19 0511  Weight: 66.3 kg 67.2 kg 72.7 kg    Examination:  General exam: Appears calm and comfortable. Respiratory system: Clear to auscultation. Respiratory effort normal. Cardiovascular system: S1 & S2 heard, RRR. No murmurs, rubs, gallops or clicks. Gastrointestinal system: Abdomen is nondistended, soft and nontender. No organomegaly or masses felt. Normal bowel sounds heard. Central nervous system: Alert and oriented. No focal neurological deficits. Extremities: No edema. No calf tenderness. Skin: No cyanosis. No rashes. Psychiatry: Judgement and insight appear normal. Mood & affect  appropriate.   Data Reviewed: I have personally reviewed following labs and imaging studies  CBC: Recent Labs  Lab 08/02/19 1006 08/03/19 0427 08/04/19 0536 08/04/19 2257 08/05/19 0819 08/05/19 0944 08/05/19 2318 08/06/19 0305  WBC 6.9 5.2 5.2  --  4.8  --   --  5.0  HGB 7.6* 9.4* 9.4* 9.0* 9.4* 9.9* 9.3* 9.0*  HCT 23.6* 29.8* 30.5* 28.3* 29.6* 31.4* 29.5* 28.9*  MCV 90.1 92.3 94.7  --  94.6  --   --  95.4  PLT 132* 124* 129*  --  127*  --   --  [id number]   Basic Metabolic Panel: Recent Labs  Lab 07/31/19 0107 08/01/19 0032 08/02/19 0545 08/03/19 0427  NA 137 136 137 141  K 4.7 5.4* 4.4 4.5  CL 107 106 110 111  CO2 25 24 25 24   GLUCOSE 152* 130* 85 86  BUN 15 33* 13 5*  CREATININE 0.74 0.96 0.73 0.68  CALCIUM 6.9* 6.7* 7.0* 7.5*  MG 1.5* 1.9  --   --   PHOS 2.8  --   --   --    GFR: Estimated Creatinine Clearance: 60.2 mL/min (by C-G formula based on SCr of 0.68 mg/dL). Liver Function Tests: Recent Labs  Lab 07/31/19 0107  AST 20  ALT 17  ALKPHOS 50  BILITOT 0.4  PROT 3.7*  ALBUMIN 1.9*   No results for input(s): LIPASE, AMYLASE in the last 168 hours. No results for input(s): AMMONIA in the last 168 hours. Coagulation Profile: Recent Labs  Lab 07/31/19 1504 08/02/19 1603  INR 1.5* 1.0   Cardiac Enzymes: No results for input(s): CKTOTAL, CKMB, CKMBINDEX, TROPONINI in the last 168 hours. BNP (last 3 results) No results for input(s): PROBNP in the last 8760 hours. HbA1C: No results for input(s): HGBA1C in the last 72 hours. CBG: Recent Labs  Lab 07/31/19 1618 08/04/19 1950  GLUCAP 154* 117*   Lipid Profile: No results for input(s): CHOL, HDL, LDLCALC, TRIG, CHOLHDL, LDLDIRECT in the last 72 hours. Thyroid Function Tests: No results for input(s): TSH, T4TOTAL, FREET4, T3FREE, THYROIDAB in the last 72 hours. Anemia Panel: No results for input(s): VITAMINB12, FOLATE, FERRITIN, TIBC, IRON, RETICCTPCT in the last 72 hours. Sepsis Labs: No results  for input(s): PROCALCITON, LATICACIDVEN in  the last 168 hours.  Recent Results (from the past 240 hour(s))  SARS CORONAVIRUS 2 (TAT 6-24 HRS) Nasopharyngeal Nasopharyngeal Swab     Status: Abnormal   Collection Time: 07/27/19  4:41 PM   Specimen: Nasopharyngeal Swab  Result Value Ref Range Status   SARS Coronavirus 2 POSITIVE (A) NEGATIVE Final    Comment: RESULT CALLED TO, READ BACK BY AND VERIFIED WITH: Hedda Slade, RN AT [phone] ON 07/28/2019 BY SAINVILUS S (NOTE) SARS-CoV-2 target nucleic acids are DETECTED. The SARS-CoV-2 RNA is generally detectable in upper and lower respiratory specimens during the acute phase of infection. Positive results are indicative of the presence of SARS-CoV-2 RNA. Clinical correlation with patient history and other diagnostic information is  necessary to determine patient infection status. Positive results do not rule out bacterial infection or co-infection with other viruses.  The expected result is Negative. Fact Sheet for Patients: SugarRoll.be Fact Sheet for Healthcare Providers: https://www.woods-mathews.com/ This test is not yet approved or cleared by the [place] FDA and  has been authorized for detection and/or diagnosis of SARS-CoV-2 by FDA under an Emergency Use Authorization (EUA). This EUA will remain  in effect (meaning this test can be  used) for the duration of the COVID-19 declaration under Section 564(b)(1) of the Act, 21 U.S.C. section 360bbb-3(b)(1), unless the authorization is terminated or revoked sooner. Performed at [facility], [place] [address] [place], [place] [address]   MRSA PCR Screening     Status: None   Collection Time: 07/31/19  4:20 PM   Specimen: Nasopharyngeal  Result Value Ref Range Status   MRSA by PCR NEGATIVE NEGATIVE Final    Comment:        The GeneXpert MRSA Assay (FDA approved for NASAL specimens only), is one component of a comprehensive MRSA  colonization surveillance program. It is not intended to diagnose MRSA infection nor to guide or monitor treatment for MRSA infections. Performed at [facility], [place] [address] [place], [place] [address]          Radiology Studies: No results found.      Scheduled Meds: . sodium chloride   Intravenous Once  . sodium chloride   Intravenous Once  . sodium chloride   Intravenous Once  . Chlorhexidine Gluconate Cloth  6 each Topical Q0600  . gabapentin  900 mg Oral QHS  . mometasone-formoterol  2 puff Inhalation BID  . polyethylene glycol  17 g Oral Daily  . sodium chloride flush  10-40 mL Intracatheter Q12H  . umeclidinium bromide  1 puff Inhalation Daily   Continuous Infusions: . sodium chloride 75 mL/hr at 08/06/19 0559  . pantoprozole (PROTONIX) infusion 8 mg/hr (08/05/19 2042)     LOS: 11 days     Cordelia Poche, MD [facility] 08/06/2019, 8:55 AM  If 7PM-7AM, please contact night-coverage [email or web address]

## 2019-08-07 LAB — CBC
HCT: 30.3 % — ABNORMAL LOW (ref 36.0–46.0)
Hemoglobin: 9.2 g/dL — ABNORMAL LOW (ref 12.0–15.0)
MCH: 29.3 pg (ref 26.0–34.0)
MCHC: 30.4 g/dL (ref 30.0–36.0)
MCV: 96.5 fL (ref 80.0–100.0)
Platelets: 127 10*3/uL — ABNORMAL LOW (ref 150–400)
RBC: 3.14 MIL/uL — ABNORMAL LOW (ref 3.87–5.11)
RDW: 19.7 % — ABNORMAL HIGH (ref 11.5–15.5)
WBC: 4.5 10*3/uL (ref 4.0–10.5)
nRBC: 0 % (ref 0.0–0.2)

## 2019-08-07 NOTE — Plan of Care (Signed)

## 2019-08-07 NOTE — Progress Notes (Signed)
PROGRESS NOTE    Kelly Phillips  [id number] DOB: 06/24/1946 DOA: 07/26/2019 PCP: No primary care provider on file.   Brief Narrative: Kelly Phillips is a 73 y.o. female with a history of COPD 3 L of oxygen, coronary artery disease, former smoker. She presented secondary to weakness and fatigue and found to have an active upper GI bleed. She is s/p multiple units of PRBC. She underwent EGD which was significant for a large prepyloric ulcer and treated with epinephrine injection/clip placement.   Assessment & Plan:   Active Problems:   Upper GI bleed   COPD (chronic obstructive pulmonary disease) (HCC)   COVID-19 virus infection   Acute blood loss anemia   Thrombocytopenia (HCC)   Acute blood loss anemia Secondary to GI bleed from large prepyloric ulcer. S/p 5 units of PRBC and 1 unit of platelets to date. Hemoglobin responds to blood transfusions. Patient is s/p EGD on 12/6. IR consulted for consideration of embolization of GDA but the will defer for now and recommend managing medically as able; will need to re-consider if continued bleeding or worsening stability. BM have become increasingly more brown. Hemoglobin is stable.  -GI recommendations: Soft diet, Protonix IV, discharge on Protonix 40 mg BID x4 weeks, then 40 mg daily for 4 weeks -Watch for continued melena  COVID-19 infection Patient completed Remdesivir. Did not receive Decadron. Asymptomatic. -Continue to monitor for symptoms -Continue Airborne/Contact isolation  COPD Chronic respiratory failure with hypoxia Stable. On 3L of oxygen via [place] -Continue Albuterol prn, Incruse Ellipta, Dulera  Leukocytosis Resolved.  Thrombocytopenia Currently stable.  Hypomagnesemia Resolved. Stable.  Hyperkalemia Transient. Resolved.  ST depression Troponin negative. No chest pain. Outpatient follow-up   DVT prophylaxis: SCDs Code Status:   Code Status: DNR Family Communication: None Disposition Plan: Discharge to  SNF. Medically stable for discharge.   Consultants:   Gastroenterology  Interventional radiology  PCCM  Procedures:   12/6: EGD Impression:               - Normal esophagus.                           - Gastric ulcer with a visible vessel. Injected.                            Clip was placed.                           - See discription above duodenal ulcers.                           - No specimens collected. Recommendation:           - NPO.                           - Continue present medications.                           - Watch for further bleeding. If significant                            bleeding recurs would call IR for embolization.  Antimicrobials:  Remdesivir (12/1>>12/5)    Subjective: No issues.  Objective: Vitals:  08/06/19 2229 08/07/19 0500 08/07/19 0558 08/07/19 1259  BP: (!) 149/102  (!) 129/91 132/74  Pulse: 76  90 81  Resp: 16  16 18   Temp: 98.1 F (36.7 C)  98.5 F (36.9 C) 98.6 F (37 C)  TempSrc: Oral  Oral Oral  SpO2: 97%  99% 99%  Weight:  64.9 kg    Height:        Intake/Output Summary (Last 24 hours) at 08/07/2019 1358 Last data filed at 08/07/2019 0905 Gross per 24 hour  Intake --  Output 3000 ml  Net -3000 ml   Filed Weights   08/04/19 0457 08/05/19 0511 08/07/19 0500  Weight: 67.2 kg 72.7 kg 64.9 kg    Examination:  General exam: Appears calm and comfortable Respiratory system: Clear to auscultation. Respiratory effort normal. Cardiovascular system: S1 & S2 heard, RRR. No murmurs, rubs, gallops or clicks. Gastrointestinal system: Abdomen is nondistended, soft and nontender. Normal bowel sounds heard. Central nervous system: Alert and oriented. No focal neurological deficits. Extremities: No edema. No calf tenderness Skin: No cyanosis. No rashes Psychiatry: Judgement and insight appear normal. Mood & affect appropriate.    Data Reviewed: I have personally reviewed following labs and imaging studies  CBC: Recent  Labs  Lab 08/03/19 0427 08/04/19 0536 08/05/19 0819 08/05/19 0944 08/05/19 2318 08/06/19 0305 08/07/19 0247  WBC 5.2 5.2 4.8  --   --  5.0 4.5  HGB 9.4* 9.4* 9.4* 9.9* 9.3* 9.0* 9.2*  HCT 29.8* 30.5* 29.6* 31.4* 29.5* 28.9* 30.3*  MCV 92.3 94.7 94.6  --   --  95.4 96.5  PLT 124* 129* 127*  --   --  140* [id number]*   Basic Metabolic Panel: Recent Labs  Lab 08/01/19 0032 08/02/19 0545 08/03/19 0427  NA 136 137 141  K 5.4* 4.4 4.5  CL 106 110 111  CO2 24 25 24   GLUCOSE 130* 85 86  BUN 33* 13 5*  CREATININE 0.96 0.73 0.68  CALCIUM 6.7* 7.0* 7.5*  MG 1.9  --   --    GFR: Estimated Creatinine Clearance: 57 mL/min (by C-G formula based on SCr of 0.68 mg/dL). Liver Function Tests: No results for input(s): AST, ALT, ALKPHOS, BILITOT, PROT, ALBUMIN in the last 168 hours. No results for input(s): LIPASE, AMYLASE in the last 168 hours. No results for input(s): AMMONIA in the last 168 hours. Coagulation Profile: Recent Labs  Lab 07/31/19 1504 08/02/19 1603  INR 1.5* 1.0   Cardiac Enzymes: No results for input(s): CKTOTAL, CKMB, CKMBINDEX, TROPONINI in the last 168 hours. BNP (last 3 results) No results for input(s): PROBNP in the last 8760 hours. HbA1C: No results for input(s): HGBA1C in the last 72 hours. CBG: Recent Labs  Lab 07/31/19 1618 08/04/19 1950  GLUCAP 154* 117*   Lipid Profile: No results for input(s): CHOL, HDL, LDLCALC, TRIG, CHOLHDL, LDLDIRECT in the last 72 hours. Thyroid Function Tests: No results for input(s): TSH, T4TOTAL, FREET4, T3FREE, THYROIDAB in the last 72 hours. Anemia Panel: No results for input(s): VITAMINB12, FOLATE, FERRITIN, TIBC, IRON, RETICCTPCT in the last 72 hours. Sepsis Labs: No results for input(s): PROCALCITON, LATICACIDVEN in the last 168 hours.  Recent Results (from the past 240 hour(s))  MRSA PCR Screening     Status: None   Collection Time: 07/31/19  4:20 PM   Specimen: Nasopharyngeal  Result Value Ref Range Status   MRSA  by PCR NEGATIVE NEGATIVE Final    Comment:        The GeneXpert  MRSA Assay (FDA approved for NASAL specimens only), is one component of a comprehensive MRSA colonization surveillance program. It is not intended to diagnose MRSA infection nor to guide or monitor treatment for MRSA infections. Performed at [facility], [place] [address] [place],  [address]          Radiology Studies: No results found.      Scheduled Meds: . Chlorhexidine Gluconate Cloth  6 each Topical Q0600  . gabapentin  900 mg Oral QHS  . mouth rinse  15 mL Mouth Rinse BID  . mometasone-formoterol  2 puff Inhalation BID  . pantoprazole (PROTONIX) IV  40 mg Intravenous Q12H  . polyethylene glycol  17 g Oral Daily  . sodium chloride flush  10-40 mL Intracatheter Q12H  . umeclidinium bromide  1 puff Inhalation Daily   Continuous Infusions:    LOS: 12 days     Cordelia Poche, MD [facility] 08/07/2019, 1:58 PM  If 7PM-7AM, please contact night-coverage [email or web address]

## 2019-08-08 LAB — CBC
HCT: 34 % — ABNORMAL LOW (ref 36.0–46.0)
Hemoglobin: 10.5 g/dL — ABNORMAL LOW (ref 12.0–15.0)
MCH: 29.7 pg (ref 26.0–34.0)
MCHC: 30.9 g/dL (ref 30.0–36.0)
MCV: 96 fL (ref 80.0–100.0)
Platelets: 137 10*3/uL — ABNORMAL LOW (ref 150–400)
RBC: 3.54 MIL/uL — ABNORMAL LOW (ref 3.87–5.11)
RDW: 19.6 % — ABNORMAL HIGH (ref 11.5–15.5)
WBC: 4.3 10*3/uL (ref 4.0–10.5)
nRBC: 0 % (ref 0.0–0.2)

## 2019-08-08 MED ORDER — AMLODIPINE BESY-BENAZEPRIL HCL 10-20 MG PO CAPS: 1.0000 | ORAL_CAPSULE | Freq: Every day | ORAL | Status: DC

## 2019-08-08 MED ORDER — AMLODIPINE BESYLATE 10 MG PO TABS
10.0000 mg | ORAL_TABLET | Freq: Every day | ORAL | Status: DC
  Administered 2019-08-08 – 2019-08-09 (×2): 10 mg via ORAL
  Filled 2019-08-08 (×2): qty 1

## 2019-08-08 MED ORDER — BENAZEPRIL HCL 20 MG PO TABS
20.0000 mg | ORAL_TABLET | Freq: Every day | ORAL | Status: DC
  Administered 2019-08-08 – 2019-08-09 (×2): 20 mg via ORAL
  Filled 2019-08-08 (×2): qty 1

## 2019-08-08 NOTE — TOC Progression Note (Signed)
Transition of Care [facility]) - Progression Note    Patient Details  Name: Kelly Phillips MRN: [id number] Date of Birth: 1945/10/20  Transition of Care [facility] CM/SW [place], LCSW Phone Number: 08/08/2019, 10:56 AM  Clinical Narrative:    CSW reaching out to SNFs to determine availability.    Expected Discharge Plan: [place] Barriers to Discharge: Barriers Unresolved (comment)(S/p GI clip procedure - adv to clear liquids today)  Expected Discharge Plan and Services Expected Discharge Plan: [place] In-house Referral: Clinical Social Work Discharge Planning Services: NA Post Acute Care Choice: [place] Living arrangements for the past 2 months: Mobile Home                                       Social Determinants of Health (SDOH) Interventions    Readmission Risk Interventions No flowsheet data found.

## 2019-08-08 NOTE — Progress Notes (Signed)
Occupational Therapy Treatment Patient Details Name: Kelly Phillips MRN: [id number] DOB: 10-Aug-1946 Today's Date: 08/08/2019    History of present illness 73 year old female with PMH of COPD (chronic obstructive pulmonary disease) on 3L O2, PAD (peripheral artery disease), and SMA stenosis and visual impairment. She lives at home by herself. presented to [facility] ED 12/1 with complaints of generalized weakness and fatigue for a couple weeks. She had a near syncopal episode while attempting to use the restroom, and described several dark watery bowel movements. Hemoglobin discovered to be 5.4. and she was transfused 2 units. ED course otherwise significant for short run on tachyarrhythmia and COVID+. Pt with decr Hgb and incr in bloody stools on 12/6. Underwent EGD and gastric ulcer found and clip placed.    OT comments  Pt making steady progress towards OT goals this session. Session focus on functional mobility, toilet transfer/ hygiene, and standing grooming at sink. Overall, pt limited by generalized weakness and decreased activity tolerance requiring x2 seated rest breaks during session. Pt on 3L O2 via [place] with no overt shortness of breath noted. Pt completed functional mobility from EOB>BSC>sink with RW and min guard- MIN A for balance. Pt with low vision deficits at baseline needing cues to locate soap and hot vs cold water during standing grooming. Pt required total A for posterior pericare this session, but reports she would like to start practicing task next session. DC plan remains appropriate, will continue to follow acutely per POC.    Follow Up Recommendations  SNF;Supervision/Assistance - 24 hour    Equipment Recommendations  None recommended by OT    Recommendations for Other Services      Precautions / Restrictions Precautions Precautions: Fall Precaution Comments: COVID+ Restrictions Weight Bearing Restrictions: No       Mobility Bed Mobility Overal bed mobility: Needs  Assistance Bed Mobility: Supine to Sit;Sit to Supine     Supine to sit: Supervision;HOB elevated Sit to supine: Supervision;HOB elevated   General bed mobility comments: no physical assist needed for bed mobility with HOB elevated. Pt reports mild dizziness when transitioning to EOB needing short seated rest break. pt able to bend up knees and scoot self up in bed with min guard to stabilize bed pad  Transfers Overall transfer level: Needs assistance Equipment used: Rolling walker (2 wheeled) Transfers: Sit to/from Stand Sit to Stand: Min guard         General transfer comment: x3 sit>stands from EOB, BSC, and chair at sink with min guard assist with RW. cues for hand placement, but overall good technique noted    Balance Overall balance assessment: Needs assistance Sitting-balance support: No upper extremity supported Sitting balance-Leahy Scale: Fair Sitting balance - Comments: pt able to sit EOB and chair without support   Standing balance support: During functional activity;No upper extremity supported Standing balance-Leahy Scale: Fair Standing balance comment: close min guard for functional task at sink                           ADL either performed or assessed with clinical judgement   ADL Overall ADL's : Needs assistance/impaired     Grooming: Wash/dry hands;Standing;Supervision/safety Grooming Details (indicate cue type and reason): cues to locate soap/ hot vs cold water d/t visual deficits                 Toilet Transfer: Minimal assistance;BSC;RW;Ambulation;Min guard;+2 for safety/equipment Toilet Transfer Details (indicate cue type and reason): min a-  min guard for functional mobilty to<>from BSC with RW. pt required seated rest break after standing from [facility] for posterior pericare Toileting- Clothing Manipulation and Hygiene: Total assistance;Sit to/from stand Toileting - Clothing Manipulation Details (indicate cue type and reason): total A for  posterior pericare this session stating she would "practice that next time"     Functional mobility during ADLs: Minimal assistance;Rolling walker;Min guard;+2 for safety/equipment General ADL Comments: pt limited by generalized weakness and decreased activity tolerance. session focus on standing grooming, toilet transfer/hygiene. Overall, pt limited by weakness and decreased activity tolerance needing x2 seated rest breaks during session     Vision   Additional Comments: pt reports low vision at baseline. Needs things magnified so she can appropriately see   Perception     Praxis      Cognition Arousal/Alertness: Awake/alert Behavior During Therapy: WFL for tasks assessed/performed Overall Cognitive Status: Within Functional Limits for tasks assessed                                 General Comments: very pleasant and appreciative of session. asking therapist, "did I do okay for y'all?        Exercises     Shoulder Instructions       General Comments On 3 L O2 via [place], no O2 monitor accessible in room, but no overt SOB noted throughout session    Pertinent Vitals/ Pain       Pain Assessment: Faces Faces Pain Scale: Hurts a little bit Pain Location: general discomfort Pain Descriptors / Indicators: Discomfort Pain Intervention(s): Limited activity within patient's tolerance;Monitored during session;Repositioned  Home Living                                          Prior Functioning/Environment              Frequency  Min 2X/week        Progress Toward Goals  OT Goals(current goals can now be found in the care plan section)  Progress towards OT goals: Progressing toward goals  Acute Rehab OT Goals Patient Stated Goal: go home when she is safe to go home OT Goal Formulation: With patient Time For Goal Achievement: 08/12/19 Potential to Achieve Goals: Good  Plan Discharge plan remains appropriate    Co-evaluation                  AM-PAC OT "6 Clicks" Daily Activity     Outcome Measure   Help from another person eating meals?: A Little Help from another person taking care of personal grooming?: A Little Help from another person toileting, which includes using toliet, bedpan, or urinal?: A Lot Help from another person bathing (including washing, rinsing, drying)?: A Lot Help from another person to put on and taking off regular upper body clothing?: A Little Help from another person to put on and taking off regular lower body clothing?: A Lot 6 Click Score: 15    End of Session Equipment Utilized During Treatment: Rolling walker;Oxygen;Other (comment)(BSC; 3L O2 via [place])  OT Visit Diagnosis: Unsteadiness on feet (R26.81);Other abnormalities of gait and mobility (R26.89);Muscle weakness (generalized) (M62.81)   Activity Tolerance Patient tolerated treatment well   Patient Left in bed;with call bell/phone within reach;with bed alarm set   Nurse Communication Mobility status;Other (comment)(needs new purewick; BM  in toilet)        Time: [id number] OT Time Calculation (min): 29 min  Charges: OT General Charges $OT Visit: 1 Visit OT Treatments $Self Care/Home Management : 23-37 mins  Lanier Clam., COTA/L Acute Rehabilitation Services [phone] [place] 08/08/2019, 3:46 PM

## 2019-08-08 NOTE — TOC Progression Note (Signed)
Transition of Care [facility]) - Progression Note    Patient Details  Name: Kelly Phillips MRN: [id number] Date of Birth: 1945-09-23  Transition of Care [facility]) CM/SW [place], LCSW Phone Number: 08/08/2019, 1:01 PM  Clinical Narrative:    CSW presented bed offers to patient. Patient has selected [facility]. [facility] able to accept patient tomorrow. Patient will have her family bring her clothing to the SNF once she arrives.     Expected Discharge Plan: Skilled Nursing Facility Barriers to Discharge: SNF Pending bed offer  Expected Discharge Plan and Services Expected Discharge Plan: [place] In-house Referral: Clinical Social Work Discharge Planning Services: NA Post Acute Care Choice: [place] Living arrangements for the past 2 months: Mobile Home                                       Social Determinants of Health (SDOH) Interventions    Readmission Risk Interventions No flowsheet data found.

## 2019-08-08 NOTE — Progress Notes (Signed)
Physical Therapy Treatment Patient Details Name: Kelly Phillips MRN: [id number] DOB: 1946-07-25 Today's Date: 08/08/2019    History of Present Illness 73 year old female with PMH of COPD (chronic obstructive pulmonary disease) on 3L O2, PAD (peripheral artery disease), and SMA stenosis and visual impairment. She lives at home by herself. presented to [facility] ED 12/1 with complaints of generalized weakness and fatigue for a couple weeks. She had a near syncopal episode while attempting to use the restroom, and described several dark watery bowel movements. Hemoglobin discovered to be 5.4. and she was transfused 2 units. ED course otherwise significant for short run on tachyarrhythmia and COVID+. Pt with decr Hgb and incr in bloody stools on 12/6. Underwent EGD and gastric ulcer found and clip placed.     PT Comments    Pt pleasant and very willing to participate in mobility. Pt continues to fatigue quickly. She requires assist for pericare and needed additional seated rest after pericare and between each transitional movement and grooming task during session. Pt able to ambulate 15' max due to fatigue and declined HEP end of session due to fatigue. Pt educated for progression and improvement in functional mobility with continued need to mobilize during the day and request to use BSC rather than purewick during the day as well as to sit up for at least an hour in recliner today. Pt on 3L throughout session.    Follow Up Recommendations  SNF     Equipment Recommendations  None recommended by PT    Recommendations for Other Services       Precautions / Restrictions Precautions Precautions: Fall    Mobility  Bed Mobility Overal bed mobility: Modified Independent Bed Mobility: Supine to Sit     Supine to sit: Modified independent (Device/Increase time)     General bed mobility comments: increased time with HOB 40 degrees with pt pivoting to right side of bed. Pt stating she has been  sleeping in recliner at home  Transfers Overall transfer level: Needs assistance   Transfers: Sit to/from Stand Sit to Stand: Min guard         General transfer comment: minguard with cues for hand placement and safety. PT stood from bed, from [facility] x 2, from chair x 2  Ambulation/Gait Ambulation/Gait assistance: Min guard Gait Distance (Feet): 15 Feet Assistive device: Rolling walker (2 wheeled) Gait Pattern/deviations: Step-through pattern;Decreased stride length;Trunk flexed   Gait velocity interpretation: 1.31 - 2.62 ft/sec, indicative of limited community ambulator General Gait Details: cues for posture and position in RW. Pt able to walk 8' to [facility] required seated rest after standing for pericare. Then walked 8' to sink to brush teeth and hair with seated rest at arrival to sink and between grooming tasks then return 15' ambulation to chair   Stairs             Wheelchair Mobility    Modified Rankin (Stroke Patients Only)       Balance Overall balance assessment: Needs assistance Sitting-balance support: No upper extremity supported Sitting balance-Leahy Scale: Fair Sitting balance - Comments: pt able to sit EOB and chair without support   Standing balance support: Bilateral upper extremity supported Standing balance-Leahy Scale: Poor Standing balance comment: walker and supervision for static standing                            Cognition Arousal/Alertness: Awake/alert Behavior During Therapy: WFL for tasks assessed/performed Overall Cognitive Status: Within  Functional Limits for tasks assessed                                        Exercises      General Comments        Pertinent Vitals/Pain Pain Assessment: No/denies pain    Home Living                      Prior Function            PT Goals (current goals can now be found in the care plan section) Progress towards PT goals: Progressing toward goals     Frequency    Min 2X/week      PT Plan Current plan remains appropriate    Co-evaluation              AM-PAC PT "6 Clicks" Mobility   Outcome Measure  Help needed turning from your back to your side while in a flat bed without using bedrails?: A Little Help needed moving from lying on your back to sitting on the side of a flat bed without using bedrails?: A Little Help needed moving to and from a bed to a chair (including a wheelchair)?: A Little Help needed standing up from a chair using your arms (e.g., wheelchair or bedside chair)?: A Little Help needed to walk in hospital room?: A Little Help needed climbing 3-5 steps with a railing? : A Lot 6 Click Score: 17    End of Session Equipment Utilized During Treatment: Oxygen;Gait belt Activity Tolerance: Patient tolerated treatment well Patient left: in chair;with chair alarm set;with call bell/phone within reach Nurse Communication: Mobility status PT Visit Diagnosis: Unsteadiness on feet (R26.81);Other abnormalities of gait and mobility (R26.89);Muscle weakness (generalized) (M62.81);Difficulty in walking, not elsewhere classified (R26.2)     Time: [id number] PT Time Calculation (min) (ACUTE ONLY): 41 min  Charges:  $Gait Training: 8-22 mins $Therapeutic Activity: 23-37 mins                     Lizette Pazos P, PT Acute Rehabilitation Services Pager: [phone] Office: [place] 08/08/2019, 1:47 PM

## 2019-08-08 NOTE — Progress Notes (Signed)
Nutrition Brief Note  Patient identified due to LOS day 46.  73 year old female with a PMH of COPD, CAD, former smoker. Pt presented secondary to weakness and fatigue and found to have an active upper GI bleed. Pt underwent EGD which was significant for a large prepyloric ulcer and treated with epinephrine injection/clip placement.  Per MD notes, pt is medically stable for d/c to SNF.  Wt Readings from Last 15 Encounters:  08/07/19 64.9 kg    Body mass index is 26.17 kg/m. Patient meets criteria for overweight based on current BMI.   Current diet order is GI soft, patient is consuming approximately 100% of meals at this time. Labs and medications reviewed.   No nutrition interventions warranted at this time. If nutrition issues arise, please consult RD.    Gaynell Face, MS, RD, LDN Inpatient Clinical Dietitian Pager: [phone] Weekend/After Hours: [phone]

## 2019-08-08 NOTE — Progress Notes (Signed)
PROGRESS NOTE    Kelly Phillips  [id number] DOB: 19-Jan-1946 DOA: 07/26/2019 PCP: No primary care provider on file.   Brief Narrative: Kelly Phillips is a 73 y.o. female with a history of COPD 3 L of oxygen, coronary artery disease, former smoker. She presented secondary to weakness and fatigue and found to have an active upper GI bleed. She is s/p multiple units of PRBC. She underwent EGD which was significant for a large prepyloric ulcer and treated with epinephrine injection/clip placement.   Assessment & Plan:   Active Problems:   Upper GI bleed   COPD (chronic obstructive pulmonary disease) (HCC)   COVID-19 virus infection   Acute blood loss anemia   Thrombocytopenia (HCC)   Acute blood loss anemia Secondary to GI bleed from large prepyloric ulcer. S/p 5 units of PRBC and 1 unit of platelets to date. Hemoglobin responds to blood transfusions. Patient is s/p EGD on 12/6. IR consulted for consideration of embolization of GDA but the will defer for now and recommend managing medically as able; will need to re-consider if continued bleeding or worsening stability. BM have become increasingly more brown. Hemoglobin is stable.  -GI recommendations: Soft diet, Protonix IV, discharge on Protonix 40 mg BID x4 weeks, then 40 mg daily for 4 weeks -Watch for continued melena -Discontinue daily CBC  COVID-19 infection Patient completed Remdesivir. Did not receive Decadron. Asymptomatic. -Continue to monitor for symptoms -Continue Airborne/Contact isolation  COPD Chronic respiratory failure with hypoxia Stable. On 3L of oxygen via [place] -Continue Albuterol prn, Incruse Ellipta, Dulera  Leukocytosis Resolved.  Thrombocytopenia Currently stable.  Hypomagnesemia Resolved. Stable.  Hyperkalemia Transient. Resolved.  ST depression Troponin negative. No chest pain. Outpatient follow-up   DVT prophylaxis: SCDs Code Status:   Code Status: DNR Family Communication:  None Disposition Plan: Discharge to SNF. Medically stable for discharge.   Consultants:   Gastroenterology  Interventional radiology  PCCM  Procedures:   12/6: EGD Impression:               - Normal esophagus.                           - Gastric ulcer with a visible vessel. Injected.                            Clip was placed.                           - See discription above duodenal ulcers.                           - No specimens collected. Recommendation:           - NPO.                           - Continue present medications.                           - Watch for further bleeding. If significant                            bleeding recurs would call IR for embolization.  Antimicrobials:  Remdesivir (12/1>>12/5)    Subjective: No concerns. Brown stools  Objective: Vitals:   08/07/19 0558 08/07/19 1259 08/07/19 2140 08/08/19 0644  BP: (!) 129/91 132/74 (!) 148/97 108/76  Pulse: 90 81 100 93  Resp: 16 18 15 16   Temp: 98.5 F (36.9 C) 98.6 F (37 C) 97.7 F (36.5 C) 98 F (36.7 C)  TempSrc: Oral Oral Oral Oral  SpO2: 99% 99% 99% 98%  Weight:      Height:        Intake/Output Summary (Last 24 hours) at 08/08/2019 1119 Last data filed at 08/07/2019 2026 Gross per 24 hour  Intake --  Output 600 ml  Net -600 ml   Filed Weights   08/04/19 0457 08/05/19 0511 08/07/19 0500  Weight: 67.2 kg 72.7 kg 64.9 kg    Examination:  General exam: Appears calm and comfortable Respiratory system: Clear to auscultation. Respiratory effort normal. Cardiovascular system: S1 & S2 heard, RRR. No murmurs, rubs, gallops or clicks. Gastrointestinal system: Abdomen is nondistended, soft and nontender. No organomegaly or masses felt. Normal bowel sounds heard. Central nervous system: Alert and oriented. No focal neurological deficits. Extremities: No edema. No calf tenderness Skin: No cyanosis. No rashes Psychiatry: Judgement and insight appear normal. Mood & affect  appropriate.     Data Reviewed: I have personally reviewed following labs and imaging studies  CBC: Recent Labs  Lab 08/04/19 0536 08/05/19 0819 08/05/19 0944 08/05/19 2318 08/06/19 0305 08/07/19 0247 08/08/19 0500  WBC 5.2 4.8  --   --  5.0 4.5 4.3  HGB 9.4* 9.4* 9.9* 9.3* 9.0* 9.2* 10.5*  HCT 30.5* 29.6* 31.4* 29.5* 28.9* 30.3* 34.0*  MCV 94.7 94.6  --   --  95.4 96.5 96.0  PLT 129* 127*  --   --  140* 127* 0000000*   Basic Metabolic Panel: Recent Labs  Lab 08/02/19 0545 08/03/19 0427  NA 137 141  K 4.4 4.5  CL 110 111  CO2 25 24  GLUCOSE 85 86  BUN 13 5*  CREATININE 0.73 0.68  CALCIUM 7.0* 7.5*   GFR: Estimated Creatinine Clearance: 57 mL/min (by C-G formula based on SCr of 0.68 mg/dL). Liver Function Tests: No results for input(s): AST, ALT, ALKPHOS, BILITOT, PROT, ALBUMIN in the last 168 hours. No results for input(s): LIPASE, AMYLASE in the last 168 hours. No results for input(s): AMMONIA in the last 168 hours. Coagulation Profile: Recent Labs  Lab 08/02/19 1603  INR 1.0   Cardiac Enzymes: No results for input(s): CKTOTAL, CKMB, CKMBINDEX, TROPONINI in the last 168 hours. BNP (last 3 results) No results for input(s): PROBNP in the last 8760 hours. HbA1C: No results for input(s): HGBA1C in the last 72 hours. CBG: Recent Labs  Lab 08/04/19 1950  GLUCAP 117*   Lipid Profile: No results for input(s): CHOL, HDL, LDLCALC, TRIG, CHOLHDL, LDLDIRECT in the last 72 hours. Thyroid Function Tests: No results for input(s): TSH, T4TOTAL, FREET4, T3FREE, THYROIDAB in the last 72 hours. Anemia Panel: No results for input(s): VITAMINB12, FOLATE, FERRITIN, TIBC, IRON, RETICCTPCT in the last 72 hours. Sepsis Labs: No results for input(s): PROCALCITON, LATICACIDVEN in the last 168 hours.  Recent Results (from the past 240 hour(s))  MRSA PCR Screening     Status: None   Collection Time: 07/31/19  4:20 PM   Specimen: Nasopharyngeal  Result Value Ref Range Status    MRSA by PCR NEGATIVE NEGATIVE Final    Comment:        The GeneXpert MRSA Assay (FDA approved for NASAL specimens only), is one component of a  comprehensive MRSA colonization surveillance program. It is not intended to diagnose MRSA infection nor to guide or monitor treatment for MRSA infections. Performed at [facility], [place] [address] [place], [place] [address]          Radiology Studies: No results found.      Scheduled Meds: . amLODipine  10 mg Oral Daily   And  . benazepril  20 mg Oral Daily  . Chlorhexidine Gluconate Cloth  6 each Topical Q0600  . gabapentin  900 mg Oral QHS  . mouth rinse  15 mL Mouth Rinse BID  . mometasone-formoterol  2 puff Inhalation BID  . pantoprazole (PROTONIX) IV  40 mg Intravenous Q12H  . polyethylene glycol  17 g Oral Daily  . sodium chloride flush  10-40 mL Intracatheter Q12H  . umeclidinium bromide  1 puff Inhalation Daily   Continuous Infusions:    LOS: 13 days     Cordelia Poche, MD [facility] 08/08/2019, 11:19 AM  If 7PM-7AM, please contact night-coverage [email or web address]

## 2019-08-09 MED ORDER — PANTOPRAZOLE SODIUM 40 MG PO TBEC: DELAYED_RELEASE_TABLET | ORAL | Status: DC

## 2019-08-09 MED ORDER — ALPRAZOLAM 0.25 MG PO TABS: 0.1250 mg | ORAL_TABLET | Freq: Two times a day (BID) | ORAL | 0 refills | Status: DC | PRN

## 2019-08-09 MED ORDER — POLYETHYLENE GLYCOL 3350 17 G PO PACK: 17.0000 g | PACK | Freq: Every day | ORAL | 0 refills | Status: AC | PRN

## 2019-08-09 MED ORDER — UMECLIDINIUM BROMIDE 62.5 MCG/INH IN AEPB: 1.0000 | INHALATION_SPRAY | Freq: Every day | RESPIRATORY_TRACT | Status: DC

## 2019-08-09 NOTE — Progress Notes (Signed)
Gave report to [facility]. [facility] called for transport

## 2019-08-09 NOTE — Discharge Summary (Signed)
Physician Discharge Summary  Kelly Phillips [id number] DOB: [birth year] DOA: 07/26/2019  PCP: No primary care provider on file.  Admit date: 07/26/2019 Discharge date: 08/09/2019  Admitted From: Home Disposition: SNF  Recommendations for Outpatient Follow-up:  1. Follow up with PCP in 1 week 2. Follow up with GI in 2 weeks 3. Please obtain BMP/CBC in one week 4. Please follow up on the following pending results: None  Home Health: SNF Equipment/Devices: SNF  Discharge Condition: Stable CODE STATUS: DNR Diet recommendation: Soft diet   Brief/Interim Summary:  Admission HPI written by Georgann Housekeeper, NP   History of present illness  73 year old female with PMH as below, which is significant for former smoker, 3 vessel CAD and COPD on 3 liters. She lives at home by herself, with family and caregivers stopping by frequently to care for her. She has not left the house much since [place] hit. She presented to [facility] ED 12/1 with complaints of generalized weakness and fatigue for a couple weeks. She had a near syncopal episode while attempting to use the restroom and presented to ED. On questioning, she did describe several dark watery bowel movements. She was felt to be pale on exam as well. Hemoglobin discovered to be 5.4. and she was transfused 2 units. ED course otherwise significant for short run on tachyarrhythmia and her COVID swab came back positive. No clear risk factors or known sick contacts. Describes no COVID specific symptoms in the last month. Also ECG showed some inferolateral ST depression. She was transferred to [facility] ICU for further care and GI consultation. PCCM to admit.    Hospital course:  Acute blood loss anemia Secondary to GI bleed from large prepyloric ulcer. S/p 5 units of PRBC and 1 unit of platelets to date. Hemoglobin responds to blood transfusions. Patient is s/p EGD on 12/6 which was significant for gastric ulcer with visible vessel and s/p  epinephrine injection and clip. IR consulted for consideration of embolization of GDA but they will defer for now and recommend managing medically as able; will need to re-consider if continued bleeding or worsening stability. Bowel movements have become increasingly more brown. Hemoglobin is stable. GI recommending soft diet, Protonix IV, discharge on Protonix 40 mg BID x4 weeks, then 40 mg daily for 4 weeks.  COVID-19 infection Patient completed Remdesivir. Did not receive Decadron. Now asymptomatic. Recommend isolation until at least 08/17/2019 to complete 21 days post positive test in setting of moderate disease.  COPD Chronic respiratory failure with hypoxia Stable. On 3L of oxygen via [place]. Continue Albuterol prn, Incruse Ellipta, Pulmicort  Leukocytosis Resolved.  Thrombocytopenia Currently stable.  Hypomagnesemia Resolved. Stable.  Hyperkalemia Transient. Resolved.  ST depression Troponin negative. No chest pain. Outpatient follow-up   Discharge Diagnoses:  Active Problems:   Upper GI bleed   COPD (chronic obstructive pulmonary disease) (HCC)   COVID-19 virus infection   Acute blood loss anemia   Thrombocytopenia (HCC)    Discharge Instructions   Allergies as of 08/09/2019      Reactions   Codeine Swelling   Salmon [fish Allergy] Anaphylaxis, Swelling   Penicillins Other (See Comments)   Unknown(caregive unsure of allergy)   Aspirin Tinitus      Medication List    STOP taking these medications   ipratropium 17 MCG/ACT inhaler Commonly known as: ATROVENT HFA Replaced by: umeclidinium bromide 62.5 MCG/INH Aepb   omeprazole 40 MG capsule Commonly known as: PRILOSEC   predniSONE 10 MG tablet Commonly known  as: DELTASONE     TAKE these medications   albuterol 108 (90 Base) MCG/ACT inhaler Commonly known as: VENTOLIN HFA Inhale 2 puffs into the lungs 2 (two) times daily.   ALPRAZolam 0.25 MG tablet Commonly known as: XANAX Take 0.5-1 tablets  (0.125-0.25 mg total) by mouth 2 (two) times daily as needed for anxiety.   amLODipine-benazepril 10-20 MG capsule Commonly known as: LOTREL Take 1 capsule by mouth daily.   budesonide 180 MCG/ACT inhaler Commonly known as: PULMICORT Inhale 1 puff into the lungs 2 (two) times daily.   gabapentin 300 MG capsule Commonly known as: NEURONTIN TAKE 3 CAPSULES BY MOUTH AT BEDTIME   pantoprazole 40 MG tablet Commonly known as: PROTONIX Take 1 tablet (40 mg total) by mouth 2 (two) times daily for 28 days, THEN 1 tablet (40 mg total) daily for 28 days. Start taking on: August 09, 2019   polyethylene glycol 17 g packet Commonly known as: MIRALAX / GLYCOLAX Take 17 g by mouth daily as needed for mild constipation or moderate constipation.   umeclidinium bromide 62.5 MCG/INH Aepb Commonly known as: INCRUSE ELLIPTA Inhale 1 puff into the lungs daily. Start taking on: August 10, 2019 Replaces: ipratropium 17 MCG/ACT inhaler      Contact information for after-discharge care    Destination    [facility] Preferred SNF .   Service: Skilled Nursing Contact information: [place] [address] [phone]             Allergies  Allergen Reactions  . Codeine Swelling  . Salmon [Fish Allergy] Anaphylaxis and Swelling  . Penicillins Other (See Comments)    Unknown(caregive unsure of allergy)  . Aspirin Tinitus    Consultations:  PCCM  [facility]   Procedures/Studies: DG Chest Port 1 View  Result Date: 07/27/2019 CLINICAL DATA:  Central line placement EXAM: PORTABLE CHEST 1 VIEW COMPARISON:  07/26/2019 FINDINGS: Central line is again seen with the tip in the SVC, stable position since prior study. No pneumothorax. No confluent airspace opacities. Heart is normal size. No effusions or acute bony abnormality. IMPRESSION: Right central line tip in the SVC. No acute cardiopulmonary disease. Electronically Signed   By: Rolm Baptise M.D.   On:  07/27/2019 00:20      Subjective: No bloody stools. No dyspnea, chest pain.  Discharge Exam: Vitals:   08/08/19 2243 08/09/19 0903  BP: 125/79 125/75  Pulse: 90   Resp:    Temp: 98.3 F (36.8 C)   SpO2: 100%    Vitals:   08/08/19 0644 08/08/19 1155 08/08/19 2243 08/09/19 0903  BP: 108/76 128/86 125/79 125/75  Pulse: 93 (!) 110 90   Resp: 16     Temp: 98 F (36.7 C)  98.3 F (36.8 C)   TempSrc: Oral  Oral   SpO2: 98%  100%   Weight:      Height:        General: Pt is alert, awake, not in acute distress Cardiovascular: RRR, S1/S2 +, no rubs, no gallops Respiratory: CTA bilaterally, no wheezing, no rhonchi Abdominal: Soft, NT, ND, bowel sounds + Extremities: no edema, no cyanosis    The results of significant diagnostics from this hospitalization (including imaging, microbiology, ancillary and laboratory) are listed below for reference.     Microbiology: Recent Results (from the past 240 hour(s))  MRSA PCR Screening     Status: None   Collection Time: 07/31/19  4:20 PM   Specimen: Nasopharyngeal  Result Value Ref  Range Status   MRSA by PCR NEGATIVE NEGATIVE Final    Comment:        The GeneXpert MRSA Assay (FDA approved for NASAL specimens only), is one component of a comprehensive MRSA colonization surveillance program. It is not intended to diagnose MRSA infection nor to guide or monitor treatment for MRSA infections. Performed at [facility], [place] [address], [place], [place] [address]      Labs: BNP (last 3 results) No results for input(s): BNP in the last 8760 hours. Basic Metabolic Panel: Recent Labs  Lab 08/03/19 0427  NA 141  K 4.5  CL 111  CO2 24  GLUCOSE 86  BUN 5*  CREATININE 0.68  CALCIUM 7.5*   Liver Function Tests: No results for input(s): AST, ALT, ALKPHOS, BILITOT, PROT, ALBUMIN in the last 168 hours. No results for input(s): LIPASE, AMYLASE in the last 168 hours. No results for input(s): AMMONIA in the last 168  hours. CBC: Recent Labs  Lab 08/04/19 0536 08/05/19 0819 08/05/19 0944 08/05/19 2318 08/06/19 0305 08/07/19 0247 08/08/19 0500  WBC 5.2 4.8  --   --  5.0 4.5 4.3  HGB 9.4* 9.4* 9.9* 9.3* 9.0* 9.2* 10.5*  HCT 30.5* 29.6* 31.4* 29.5* 28.9* 30.3* 34.0*  MCV 94.7 94.6  --   --  95.4 96.5 96.0  PLT 129* 127*  --   --  140* 127* 137*   Cardiac Enzymes: No results for input(s): CKTOTAL, CKMB, CKMBINDEX, TROPONINI in the last 168 hours. BNP: Invalid input(s): POCBNP CBG: Recent Labs  Lab 08/04/19 1950  GLUCAP 117*   D-Dimer No results for input(s): DDIMER in the last 72 hours. Hgb A1c No results for input(s): HGBA1C in the last 72 hours. Lipid Profile No results for input(s): CHOL, HDL, LDLCALC, TRIG, CHOLHDL, LDLDIRECT in the last 72 hours. Thyroid function studies No results for input(s): TSH, T4TOTAL, T3FREE, THYROIDAB in the last 72 hours.  Invalid input(s): FREET3 Anemia work up No results for input(s): VITAMINB12, FOLATE, FERRITIN, TIBC, IRON, RETICCTPCT in the last 72 hours. Urinalysis    Component Value Date/Time   COLORURINE YELLOW 05/23/2015 1521   APPEARANCEUR CLEAR 05/23/2015 1521   LABSPEC 1.006 05/23/2015 1521   PHURINE 8.5 (H) 05/23/2015 1521   GLUCOSEU NEGATIVE 05/23/2015 1521   HGBUR TRACE (A) 05/23/2015 1521   BILIRUBINUR NEGATIVE 05/23/2015 1521   KETONESUR NEGATIVE 05/23/2015 1521   PROTEINUR NEGATIVE 05/23/2015 1521   UROBILINOGEN 0.2 05/23/2015 1521   NITRITE NEGATIVE 05/23/2015 1521   LEUKOCYTESUR LARGE (A) 05/23/2015 1521   Sepsis Labs Invalid input(s): PROCALCITONIN,  WBC,  LACTICIDVEN Microbiology Recent Results (from the past 240 hour(s))  MRSA PCR Screening     Status: None   Collection Time: 07/31/19  4:20 PM   Specimen: Nasopharyngeal  Result Value Ref Range Status   MRSA by PCR NEGATIVE NEGATIVE Final    Comment:        The GeneXpert MRSA Assay (FDA approved for NASAL specimens only), is one component of a comprehensive MRSA  colonization surveillance program. It is not intended to diagnose MRSA infection nor to guide or monitor treatment for MRSA infections. Performed at [facility], [place] [address] [place], [place] [address]      Time coordinating discharge: 35 minutes  SIGNED:   Cordelia Poche, MD [facility] 08/09/2019, 12:26 PM

## 2019-08-09 NOTE — TOC Transition Note (Signed)
Transition of Care [facility]) - CM/SW Discharge Note   Patient Details  Name: Kelly Phillips MRN: [id number] Date of Birth: [birth year]  Transition of Care [facility]) CM/SW Contact:  Benard Halsted, LCSW Phone Number: 08/09/2019, 1:19 PM   Clinical Narrative:    Patient will DC to: [facility] Anticipated DC date: 08/09/19 Family notified: Son, Darren Transport by: Corey Harold   Per MD patient ready for DC to [facility]. RN, patient, patient's family, and facility notified of DC. Discharge Summary and FL2 sent to facility. RN to call report prior to discharge [phone] Room 703P). DC packet on chart. Ambulance transport requested for patient.   CSW will sign off for now as social work intervention is no longer needed. Please consult [place] again if new needs arise.  Cedric Fishman, LCSW Clinical Social Worker [phone]    Final next level of care: Skilled Nursing Facility Barriers to Discharge: No Barriers Identified   Patient Goals and CMS Choice Patient states their goals for this hospitalization and ongoing recovery are:: "right now I am weak, I want to get sronger" CMS Medicare.gov Compare Post Acute Care list provided to:: Patient Choice offered to / list presented to : Patient  Discharge Placement   Existing PASRR number confirmed : 08/09/19          Patient [facility] bed at: [facility] Patient to be transferred to facility by: [place] Name of family member notified: Patient notifying family Patient and family notified of of transfer: 08/09/19  Discharge Plan and Services In-house Referral: Clinical Social Work Discharge Planning Services: NA Post Acute Care Choice: [place]                               Social Determinants of Health (SDOH) Interventions     Readmission Risk Interventions No flowsheet data found.

## 2019-08-09 NOTE — Progress Notes (Signed)
Ollen Bowl to be D/C'd [place] per MD order.    VSS, Skin clean, dry and intact without evidence of skin break down, no evidence of skin tears noted. IV catheter discontinued intact. Site without signs and symptoms of complications. Dressing and pressure applied.  An After Visit Summary and perscripted was printed and given to PTAR. Pt. Escorted via PTAR to [place].   Jeanella Craze 08/09/2019 6:25 PM

## 2019-08-09 NOTE — Care Management Important Message (Signed)
Important Message  Patient Details  Name: Kelly Phillips MRN: [id number] Date of Birth: [birth year]   Medicare Important Message Given:  Yes - Important Message mailed due to current National Emergency  Verbal consent obtained due to current National Emergency  Relationship to patient: Child Contact Name: Drue Second Call Date: 08/09/19  Time: 1526 Phone: [phone] Outcome: Spoke with contact Important Message mailed to: Patient address on file    [name] 08/09/2019, 3:26 PM

## 2019-08-12 ENCOUNTER — Ambulatory Visit: Payer: Medicare Other | Admitting: Cardiology

## 2019-08-16 DIAGNOSIS — I10 Essential (primary) hypertension: Secondary | ICD-10-CM | POA: Diagnosis not present

## 2019-08-16 DIAGNOSIS — D6489 Other specified anemias: Secondary | ICD-10-CM | POA: Diagnosis not present

## 2019-08-21 DIAGNOSIS — Z87891 Personal history of nicotine dependence: Secondary | ICD-10-CM | POA: Diagnosis not present

## 2019-08-21 DIAGNOSIS — H547 Unspecified visual loss: Secondary | ICD-10-CM | POA: Diagnosis not present

## 2019-08-21 DIAGNOSIS — I739 Peripheral vascular disease, unspecified: Secondary | ICD-10-CM | POA: Diagnosis not present

## 2019-08-21 DIAGNOSIS — K5904 Chronic idiopathic constipation: Secondary | ICD-10-CM | POA: Diagnosis not present

## 2019-08-21 DIAGNOSIS — I472 Ventricular tachycardia: Secondary | ICD-10-CM | POA: Diagnosis not present

## 2019-08-21 DIAGNOSIS — I10 Essential (primary) hypertension: Secondary | ICD-10-CM | POA: Diagnosis not present

## 2019-08-21 DIAGNOSIS — G8929 Other chronic pain: Secondary | ICD-10-CM | POA: Diagnosis not present

## 2019-08-21 DIAGNOSIS — J9610 Chronic respiratory failure, unspecified whether with hypoxia or hypercapnia: Secondary | ICD-10-CM | POA: Diagnosis not present

## 2019-08-21 DIAGNOSIS — U071 COVID-19: Secondary | ICD-10-CM | POA: Diagnosis not present

## 2019-08-21 DIAGNOSIS — Z9181 History of falling: Secondary | ICD-10-CM | POA: Diagnosis not present

## 2019-08-21 DIAGNOSIS — M549 Dorsalgia, unspecified: Secondary | ICD-10-CM | POA: Diagnosis not present

## 2019-08-21 DIAGNOSIS — I2510 Atherosclerotic heart disease of native coronary artery without angina pectoris: Secondary | ICD-10-CM | POA: Diagnosis not present

## 2019-08-21 DIAGNOSIS — D696 Thrombocytopenia, unspecified: Secondary | ICD-10-CM | POA: Diagnosis not present

## 2019-08-21 DIAGNOSIS — K254 Chronic or unspecified gastric ulcer with hemorrhage: Secondary | ICD-10-CM | POA: Diagnosis not present

## 2019-08-21 DIAGNOSIS — J449 Chronic obstructive pulmonary disease, unspecified: Secondary | ICD-10-CM | POA: Diagnosis not present

## 2019-08-23 ENCOUNTER — Other Ambulatory Visit: Payer: Self-pay | Admitting: *Deleted

## 2019-08-23 DIAGNOSIS — I10 Essential (primary) hypertension: Secondary | ICD-10-CM

## 2019-08-23 NOTE — Patient Outreach (Signed)
Member screened for potential [facility] Care Management needs as a benefit of [place] Medicare.  Mrs. Kelly Phillips recently discharged from [facility] on 08/20/19.   Telephone call made to Mrs. Kelly Phillips at [phone]. Patient identifiers confirmed. Discussed [facility] Care Management services. She is agreeable and verbal consent obtained.   Mrs. Kelly Phillips states she lives alone. She usually has 2 caregivers that assist her. However, per Mrs. Kelly Phillips, one of the caregiver's husband recently passed and the other caregiver has been extremely busy. Mrs. Kelly Phillips is interested in the [facility] meal program and potential transportation assistance.   Mrs. Kelly Phillips states she a PCP appointment scheduled for January 7th at 1130 am with Dr. Venetia Maxon. She is not sure if  she will have transportation. Mrs. Kelly Phillips is agreeable to [place] Worker referral.   Mrs. Kelly Phillips is also agreeable to [facility] RNCM referral. She endorses that she has chronic COPD, HTN. Also has a history of COVID-19, CAD, anemia.  Confirmed best contact number for her is [phone]. She also reports she has home health but not sure the name of the agency.  Will make referrals for [facility] RNCM and [facility] Social Worker.    Marthenia Rolling, MSN-Ed, RN,BSN [place] Acute Care Coordinator [phone] [facility]) [phone]  (Toll free office)

## 2019-08-24 ENCOUNTER — Other Ambulatory Visit: Payer: Self-pay

## 2019-08-24 NOTE — Patient Outreach (Signed)
[place] [facility]) Care Management  08/24/2019  Kelly Phillips 05/22/1946 [id number]   Social work referral received from [place], Marthenia Rolling, for meals and transportation resources.   Successful outreach to patient today.  Informed patient about temporary meal delivery through [employer] and she consented to referral.  Patient will receive a total of 28 prepared meals over the next 30 days.  Patient reports that a friend of hers typically transports her as needed.  Per patient, this friends spouse passed away very recently and her friend is having a very difficult time. She is unsure how much assistance she will provide at this time. Patient has follow up appointmet with Dr. Venetia Maxon on 1/7 @ 11:30 AM.  Liana Gerold to arrange transportation via [facility] contract with [place] but patient stated that she believes other friends will be able to assist.  No other social work needs were identified during call so case is being closed.  Provided patient with my contact information and encouraged her to call if she is unable to find transportation to this appointment on 1/8.  Ronn Melena, BSW Social Worker [phone]

## 2019-08-25 ENCOUNTER — Other Ambulatory Visit: Payer: Self-pay

## 2019-08-25 NOTE — Patient Outreach (Signed)
[place] [facility]) Care Management  08/25/2019   Caydin Alejos 03-18-1946 [id number]   Placed call to patient and explained reason for call.  Patient has agreed to [facility] services.  MD office does transition of care.  Subjective: Patient reports she went to the hospital with a GI bleed and was also diagnosed with COVID.  Reports never had any symptoms of COVID and was very careful not to get exposed.  Patient reports she has COPD and is oxygen dependent. Reports that she lives alone but has friends who are bringing her food.  Reports she is able to manage at home alone. Reports she has lost about 10 pounds and feels weak.  Patient reports she has follow up planned with primary MD - Dr. Venetia Maxon on 09/01/2019.  Reports that she has someone to take her to her appointments.  Patient reports she is visually impaired.    Current Medications:  Current Outpatient Medications  Medication Sig Dispense Refill  . albuterol (VENTOLIN HFA) 108 (90 Base) MCG/ACT inhaler Inhale 2 puffs into the lungs 2 (two) times daily.     Marland Kitchen ALPRAZolam (XANAX) 0.25 MG tablet Take 0.5-1 tablets (0.125-0.25 mg total) by mouth 2 (two) times daily as needed for anxiety. 10 tablet 0  . amLODipine-benazepril (LOTREL) 10-20 MG capsule Take 1 capsule by mouth daily.     Marland Kitchen gabapentin (NEURONTIN) 300 MG capsule TAKE 3 CAPSULES BY MOUTH AT BEDTIME    . pantoprazole (PROTONIX) 40 MG tablet Take 1 tablet (40 mg total) by mouth 2 (two) times daily for 28 days, THEN 1 tablet (40 mg total) daily for 28 days.    . polyethylene glycol (MIRALAX / GLYCOLAX) 17 g packet Take 17 g by mouth daily as needed for mild constipation or moderate constipation.  0  . umeclidinium bromide (INCRUSE ELLIPTA) 62.5 MCG/INH AEPB Inhale 1 puff into the lungs daily.    . budesonide (PULMICORT) 180 MCG/ACT inhaler Inhale 1 puff into the lungs 2 (two) times daily.     No current facility-administered medications for this visit.    Functional Status:   In your present state of health, do you have any difficulty performing the following activities: 07/30/2019 07/27/2019  Hearing? N N  Vision? Y Y  Difficulty concentrating or making decisions? N N  Walking or climbing stairs? N -  Dressing or bathing? Y Y  Doing errands, shopping? Y Y  Some recent data might be hidden    Fall/Depression Screening: Fall Risk  07/15/2018  Falls in the past year? 0  Comment [place]i Telephone Survey: data to providers prior to load   No flowsheet data found.  Assessment:  (1) Recent admission for GI Bleed with Hgb of 5.4.  Also diagnosed with COVID (2) reviewed medication and patient reports she is taking all her medications as prescribed. (3) COPD-oxygen dependent.   (4) Follow up planned with MD on 09/01/2019 (5) Fall risk due to report of weakness.    Plan:  (1) Reviewed signs and symptoms of bleeding and when to call MD. (2) encouraged patient to take all her medications with her to MD appointment next week. Encouraged patient to take her medications as prescribed. (3) Reviewed COPD zones.  (4) Encouraged patient to discuss all concerns with MD and review and changes with her medications. (5) reviewed fall precautions. Encouraged patient to be mindful of any new symptoms. Patient denies use of walker or cane.   THN CM Care Plan Problem One     Most Recent  Value  Care Plan Problem One  Recent admission for GI bleed and covid   Role Documenting the Problem One  Care Management [place] for Problem One  Active  THN Long Term Goal   Patient will report no readmissions to tthe hospital in the next 31 days.  THN Long Term Goal Start Date  08/25/19  Interventions for Problem One Long Term Goal  Reviewed [facility] program. reviewed dischrage plan and follow up. will mail a new patient packet and [facility] calendar to patient.   THN CM Short Term Goal #1   Patient will report observing for active GI bleeding in the next 30 days.   THN CM Short Term Goal  #1 Start Date  08/25/19  Interventions for Short Term Goal #1  Reviewed signs and symtoms of GI bleeding. reviewed importance of getting urgent medical attention for any dark stools, abdominal pain, dizziness.   THN CM Short Term Goal #2   Patient will report following up with MD in the next 10 days.   THN CM Short Term Goal #2 Start Date  08/25/19  Interventions for Short Term Goal #2  Reviewed pending appointments and ensured transportation.      This note and barrier letter sent to MD. New patient packet and [facility] calendar sent to patient. Next follow up in 1 week.  Tomasa Rand, RN, BSN, CEN [facility] [employer] [phone]

## 2019-08-29 ENCOUNTER — Ambulatory Visit: Payer: Self-pay | Admitting: *Deleted

## 2019-08-31 ENCOUNTER — Other Ambulatory Visit: Payer: Self-pay

## 2019-08-31 NOTE — Patient Outreach (Signed)
Telephone assessment: Placed call to patient to follow up on progress. No answer. Left a message requesting a call back.  PLAN: will attempt again on 09/05/2019. Will mail an unsuccessful outreach letter.  Tomasa Rand, RN, BSN, CEN [facility] [employer] [phone]

## 2019-09-01 DIAGNOSIS — K277 Chronic peptic ulcer, site unspecified, without hemorrhage or perforation: Secondary | ICD-10-CM | POA: Diagnosis not present

## 2019-09-01 DIAGNOSIS — I110 Hypertensive heart disease with heart failure: Secondary | ICD-10-CM | POA: Diagnosis not present

## 2019-09-01 DIAGNOSIS — Z8719 Personal history of other diseases of the digestive system: Secondary | ICD-10-CM | POA: Diagnosis not present

## 2019-09-01 DIAGNOSIS — D500 Iron deficiency anemia secondary to blood loss (chronic): Secondary | ICD-10-CM | POA: Diagnosis not present

## 2019-09-02 ENCOUNTER — Ambulatory Visit: Payer: Self-pay

## 2019-09-05 ENCOUNTER — Other Ambulatory Visit: Payer: Self-pay

## 2019-09-06 NOTE — Patient Outreach (Signed)
Telephone assessment:  Placed call to patient who answered and reports she is doing well. Reports she has not had an additional bleeding. Reports she saw her doctor 4 days ago and was told the MD was ordering home health nurse to monitor vital signs.   Patient reports her breathing is the same it always is. Reports she will see cardiology on 09/09/2019 for increased heart rate. Patient believes that her elevated heart rate makes her breathing worse.    Physical therapy sees patient 2 times per week.  PLAN: placed call to [facility] in [place] to determine if nursing order was received. Spoke with jeff who confirms RN will see patient for initial visit on 09/06/2019. Placed another call to patient to provide update.  Will plan follow up call with patient in 10 days.  Tomasa Rand, RN, BSN, CEN [facility] [employer] [phone]

## 2019-09-08 ENCOUNTER — Other Ambulatory Visit: Payer: Self-pay | Admitting: *Deleted

## 2019-09-09 ENCOUNTER — Other Ambulatory Visit: Payer: Self-pay

## 2019-09-09 ENCOUNTER — Ambulatory Visit (INDEPENDENT_AMBULATORY_CARE_PROVIDER_SITE_OTHER): Payer: Medicare Other | Admitting: Cardiology

## 2019-09-09 ENCOUNTER — Encounter: Payer: Self-pay | Admitting: Cardiology

## 2019-09-09 VITALS — BP 90/70 | HR 101 | Ht 62.0 in | Wt 125.0 lb

## 2019-09-09 DIAGNOSIS — R0600 Dyspnea, unspecified: Secondary | ICD-10-CM

## 2019-09-09 DIAGNOSIS — Z95828 Presence of other vascular implants and grafts: Secondary | ICD-10-CM | POA: Diagnosis not present

## 2019-09-09 DIAGNOSIS — I739 Peripheral vascular disease, unspecified: Secondary | ICD-10-CM | POA: Diagnosis not present

## 2019-09-09 DIAGNOSIS — R000 Tachycardia, unspecified: Secondary | ICD-10-CM | POA: Insufficient documentation

## 2019-09-09 DIAGNOSIS — K922 Gastrointestinal hemorrhage, unspecified: Secondary | ICD-10-CM

## 2019-09-09 DIAGNOSIS — R0609 Other forms of dyspnea: Secondary | ICD-10-CM

## 2019-09-09 NOTE — Progress Notes (Signed)
Cardiology Consultation:    Date:  09/09/2019   ID:  Aneissa Marze, DOB 06-28-1946, MRN [id number]  PCP:  Street, Sharon Mt, MD  Cardiologist:  Jenne Campus, MD   Referring MD: Street, Sharon Mt, *   Chief Complaint  Patient presents with  . New Patient (Initial Visit)    Tachycardia    History of Present Illness:    Kelly Phillips is a 74 y.o. female who is being seen today for the evaluation of tachycardia at the request of Street, Sharon Mt, *.  I took care of her in 2017.  She was referred to [place] because of tachycardia quite extensive evaluation has been done she was noted to have sinus tachycardia and feeling was that it was related to her COPD, low oxygen.  I lost her from follow-up until she comes today to my office to be reestablished as a patient.  And situation is quite similar she was referred to me because of sinus tachycardia.  She denies having any chest pain tightness squeezing pressure burning chest.  She is just aware of her heart speeding up.  There is no dizziness no passing out.  She did have aortofemoral bypass graft done for complete occlusion of the distal aorta.  She seems to be doing well from that point of view she has been follow-up on the regular basis for it.  Recently in December she ended up coming to the hospital with severe anemia.  Her hemoglobin dropped to 5 she required multiple blood transfusions.  Eventually she got transferred to [facility] when she had EGD done and she was told to have bleeding ulcers.  That being addressed as successful and she seems to be doing well from that point of view. Her ability to exercise is limited.  She is tired and exhausted when she walks.  She said at the end of the day she is absolutely wiped out. Luckily she stopped smoking when she had a aortofemoral bypass graft done.  She abstain from smoking and I congratulated her for it.  Past Medical History:  Diagnosis Date  . COPD (chronic  obstructive pulmonary disease) ([place])   . PAD (peripheral artery disease) ([place])   . SMA stenosis     Past Surgical History:  Procedure Laterality Date  . AORTO-FEMORAL BYPASS GRAFT    . ESOPHAGOGASTRODUODENOSCOPY (EGD) WITH PROPOFOL N/A 07/31/2019   Procedure: ESOPHAGOGASTRODUODENOSCOPY (EGD) WITH PROPOFOL;  Surgeon: Wonda Horner, MD;  Location: [facility] ENDOSCOPY;  Service: Endoscopy;  Laterality: N/A;  . HEMOSTASIS CLIP PLACEMENT  07/31/2019   Procedure: HEMOSTASIS CLIP PLACEMENT;  Surgeon: Wonda Horner, MD;  Location: [facility] ENDOSCOPY;  Service: Endoscopy;;  . Clide Deutscher  07/31/2019   Procedure: Clide Deutscher;  Surgeon: Wonda Horner, MD;  Location: [facility] ENDOSCOPY;  Service: Endoscopy;;    Current Medications: Current Meds  Medication Sig  . albuterol (VENTOLIN HFA) 108 (90 Base) MCG/ACT inhaler Inhale 2 puffs into the lungs 2 (two) times daily.   Marland Kitchen ALPRAZolam (XANAX) 0.25 MG tablet Take 0.5-1 tablets (0.125-0.25 mg total) by mouth 2 (two) times daily as needed for anxiety.  Marland Kitchen amLODipine-benazepril (LOTREL) 5-10 MG capsule Take 1 capsule by mouth daily.  . budesonide (PULMICORT) 180 MCG/ACT inhaler Inhale 1 puff into the lungs 2 (two) times daily.  Marland Kitchen gabapentin (NEURONTIN) 300 MG capsule TAKE 3 CAPSULES BY MOUTH AT BEDTIME  . omeprazole (PRILOSEC) 40 MG capsule Take 40 mg by mouth at bedtime.  . polyethylene glycol (MIRALAX / GLYCOLAX) 17 g  packet Take 17 g by mouth daily as needed for mild constipation or moderate constipation.  Marland Kitchen umeclidinium bromide (INCRUSE ELLIPTA) 62.5 MCG/INH AEPB Inhale 1 puff into the lungs daily.     Allergies:   Codeine, Salmon [fish allergy], Penicillins, and Aspirin   Social History   Socioeconomic History  . Marital status: Widowed    Spouse name: Not on file  . Number of children: Not on file  . Years of education: Not on file  . Highest education level: Not on file  Occupational History  . Not on file  Tobacco Use  . Smoking status: Former Smoker      Quit date: 05/02/2015    Years since quitting: 4.3  . Smokeless tobacco: Never Used  Substance and Sexual Activity  . Alcohol use: Not on file  . Drug use: Not on file  . Sexual activity: Not on file  Other Topics Concern  . Not on file  Social History Narrative  . Not on file   Social Determinants of Health   Financial Resource Strain:   . Difficulty of Paying Living Expenses: Not on file  Food Insecurity:   . Worried About Charity fundraiser in the Last Year: Not on file  . Ran Out of Food in the Last Year: Not on file  Transportation Needs:   . Lack of Transportation (Medical): Not on file  . Lack of Transportation (Non-Medical): Not on file  Physical Activity:   . Days of Exercise per Week: Not on file  . Minutes of Exercise per Session: Not on file  Stress:   . Feeling of Stress : Not on file  Social Connections:   . Frequency of Communication with Friends and Family: Not on file  . Frequency of Social Gatherings with Friends and Family: Not on file  . Attends Religious Services: Not on file  . Active Member of Clubs or Organizations: Not on file  . Attends Archivist Meetings: Not on file  . Marital Status: Not on file     Family History: The patient's family history is not on file. ROS:   Please see the history of present illness.    All 14 point review of systems negative except as described per history of present illness.  EKGs/Labs/Other Studies Reviewed:    The following studies were reviewed today:    Recent Labs: 07/31/2019: ALT 17 08/01/2019: Magnesium 1.9 08/03/2019: BUN 5; Creatinine, Ser 0.68; Potassium 4.5; Sodium 141 08/08/2019: Hemoglobin 10.5; Platelets 137  Recent Lipid Panel No results found for: CHOL, TRIG, HDL, CHOLHDL, VLDL, LDLCALC, LDLDIRECT  Physical Exam:    VS:  BP 90/70   Pulse (!) 101   Ht 5\' 2"  (1.575 m)   Wt 125 lb (56.7 kg)   SpO2 97% Comment: 3 Liters of Oxygen  BMI 22.86 kg/m     Wt Readings from Last 3  Encounters:  09/09/19 125 lb (56.7 kg)  08/07/19 143 lb 1.3 oz (64.9 kg)     GEN:  Well nourished, well developed in no acute distress HEENT: Normal NECK: No JVD; No carotid bruits LYMPHATICS: No lymphadenopathy CARDIAC: RRR, no murmurs, no rubs, no gallops RESPIRATORY:  Clear to auscultation without rales, poor entry bilaterally with some wheezes ABDOMEN: Soft, non-tender, non-distended MUSCULOSKELETAL:  No edema; No deformity  SKIN: Warm and dry NEUROLOGIC:  Alert and oriented x 3 PSYCHIATRIC:  Normal affect   ASSESSMENT:    1. Peripheral vascular disease, unspecified ([place])   2. Status  post aortobifemoral bypass surgery   3. Upper GI bleed   4. Tachycardia    PLAN:    In order of problems listed above:  1. Peripheral vascular disease, status post aortofemoral graft done.  Pulses in left lower extremities are good on the right side very weak.  But she said she is doing well and she said she been follow-up for this by vascular surgeon. 2. Tachycardia again EKG will be done to check the rhythm.  I will ask you to wear a monitor for about a week to see exactly what kind of arrhythmia with dealing with.  Echocardiogram will be done as well to check left ventricle ejection fraction.  Obviously I still suspect that the reason for her tachycardia comorbidity namely COPD with any need to use bronchodilators on top of that contributing factor is her anemia.  But we need to investigate her heart as well.  I will also check make sure she had TSH done 3. History of recent GI bleed.  I will call primary care physician to get her latest CBC   Medication Adjustments/Labs and Tests Ordered: Current medicines are reviewed at length with the patient today.  Concerns regarding medicines are outlined above.  No orders of the defined types were placed in this encounter.  No orders of the defined types were placed in this encounter.   Signed, Park Liter, MD, [facility]. 09/09/2019 11:49 AM       [facility]

## 2019-09-09 NOTE — Patient Instructions (Signed)
Medication Instructions:  Your physician recommends that you continue on your current medications as directed. Please refer to the Current Medication list given to you today.  *If you need a refill on your cardiac medications before your next appointment, please call your pharmacy*  Lab Work: None.  If you have labs (blood work) drawn today and your tests are completely normal, you will receive your results only by: Marland Kitchen MyChart Message (if you have MyChart) OR . A paper copy in the mail If you have any lab test that is abnormal or we need to change your treatment, we will call you to review the results.  Testing/Procedures: Your physician has requested that you have an echocardiogram. Echocardiography is a painless test that uses sound waves to create images of your heart. It provides your doctor with information about the size and shape of your heart and how well your heart's chambers and valves are working. This procedure takes approximately one hour. There are no restrictions for this procedure.  Your physician has recommended that you wear a holter monitor. Holter monitors are medical devices that record the heart's electrical activity. Doctors most often use these monitors to diagnose arrhythmias. Arrhythmias are problems with the speed or rhythm of the heartbeat. The monitor is a small, portable device. You can wear one while you do your normal daily activities. This is usually used to diagnose what is causing palpitations/syncope (passing out). Wear for 7 days     Follow-Up: At [facility], you and your health needs are our priority.  As part of our continuing mission to provide you with exceptional heart care, we have created designated Provider Care Teams.  These Care Teams include your primary Cardiologist (physician) and Advanced Practice Providers (APPs -  Physician Assistants and Nurse Practitioners) who all work together to provide you with the care you need, when you need  it.  Your next appointment:   6 week(s)  The format for your next appointment:   In Person  Provider:   Jenne Campus, MD  Other Instructions   Echocardiogram An echocardiogram is a procedure that uses painless sound waves (ultrasound) to produce an image of the heart. Images from an echocardiogram can provide important information about:  Signs of coronary artery disease (CAD).  Aneurysm detection. An aneurysm is a weak or damaged part of an artery wall that bulges out from the normal force of blood pumping through the body.  Heart size and shape. Changes in the size or shape of the heart can be associated with certain conditions, including heart failure, aneurysm, and CAD.  Heart muscle function.  Heart valve function.  Signs of a past heart attack.  Fluid buildup around the heart.  Thickening of the heart muscle.  A tumor or infectious growth around the heart valves. Tell a health care provider about:  Any allergies you have.  All medicines you are taking, including vitamins, herbs, eye drops, creams, and over-the-counter medicines.  Any blood disorders you have.  Any surgeries you have had.  Any medical conditions you have.  Whether you are pregnant or may be pregnant. What are the risks? Generally, this is a safe procedure. However, problems may occur, including:  Allergic reaction to dye (contrast) that may be used during the procedure. What happens before the procedure? No specific preparation is needed. You may eat and drink normally. What happens during the procedure?   An IV tube may be inserted into one of your veins.  You may receive contrast  through this tube. A contrast is an injection that improves the quality of the pictures from your heart.  A gel will be applied to your chest.  A wand-like tool (transducer) will be moved over your chest. The gel will help to transmit the sound waves from the transducer.  The sound waves will  harmlessly bounce off of your heart to allow the heart images to be captured in real-time motion. The images will be recorded on a computer. The procedure may vary among health care providers and hospitals. What happens after the procedure?  You may return to your normal, everyday life, including diet, activities, and medicines, unless your health care provider tells you not to do that. Summary  An echocardiogram is a procedure that uses painless sound waves (ultrasound) to produce an image of the heart.  Images from an echocardiogram can provide important information about the size and shape of your heart, heart muscle function, heart valve function, and fluid buildup around your heart.  You do not need to do anything to prepare before this procedure. You may eat and drink normally.  After the echocardiogram is completed, you may return to your normal, everyday life, unless your health care provider tells you not to do that. This information is not intended to replace advice given to you by your health care provider. Make sure you discuss any questions you have with your health care provider. Document Revised: 12/02/2018 Document Reviewed: 09/13/2016 Elsevier Patient Education  [place].

## 2019-09-09 NOTE — Addendum Note (Signed)
Addended by: Linna Hoff R on: 09/09/2019 12:20 PM   Modules accepted: Orders

## 2019-09-14 ENCOUNTER — Ambulatory Visit (INDEPENDENT_AMBULATORY_CARE_PROVIDER_SITE_OTHER): Payer: Medicare Other

## 2019-09-14 DIAGNOSIS — R0600 Dyspnea, unspecified: Secondary | ICD-10-CM

## 2019-09-14 DIAGNOSIS — R000 Tachycardia, unspecified: Secondary | ICD-10-CM | POA: Diagnosis not present

## 2019-09-16 ENCOUNTER — Other Ambulatory Visit: Payer: Self-pay

## 2019-09-16 NOTE — Patient Outreach (Signed)
Telephone assessments:  Placed call to patient who reports she is doing well. Reports no recent bleeding. States she is wearing a heart monitor right now. Reports she continues to be active with home health PT an OT. Denies any recent falls.  PLAN: will follow up with patient in 1 week.  Tomasa Rand, RN, BSN, CEN [facility] [employer] [phone]

## 2019-09-20 DIAGNOSIS — I2510 Atherosclerotic heart disease of native coronary artery without angina pectoris: Secondary | ICD-10-CM | POA: Diagnosis not present

## 2019-09-20 DIAGNOSIS — I739 Peripheral vascular disease, unspecified: Secondary | ICD-10-CM | POA: Diagnosis not present

## 2019-09-20 DIAGNOSIS — G8929 Other chronic pain: Secondary | ICD-10-CM | POA: Diagnosis not present

## 2019-09-20 DIAGNOSIS — K5904 Chronic idiopathic constipation: Secondary | ICD-10-CM | POA: Diagnosis not present

## 2019-09-20 DIAGNOSIS — Z9181 History of falling: Secondary | ICD-10-CM | POA: Diagnosis not present

## 2019-09-20 DIAGNOSIS — I472 Ventricular tachycardia: Secondary | ICD-10-CM | POA: Diagnosis not present

## 2019-09-20 DIAGNOSIS — J9610 Chronic respiratory failure, unspecified whether with hypoxia or hypercapnia: Secondary | ICD-10-CM | POA: Diagnosis not present

## 2019-09-20 DIAGNOSIS — Z87891 Personal history of nicotine dependence: Secondary | ICD-10-CM | POA: Diagnosis not present

## 2019-09-20 DIAGNOSIS — M549 Dorsalgia, unspecified: Secondary | ICD-10-CM | POA: Diagnosis not present

## 2019-09-20 DIAGNOSIS — K254 Chronic or unspecified gastric ulcer with hemorrhage: Secondary | ICD-10-CM | POA: Diagnosis not present

## 2019-09-20 DIAGNOSIS — D696 Thrombocytopenia, unspecified: Secondary | ICD-10-CM | POA: Diagnosis not present

## 2019-09-20 DIAGNOSIS — U071 COVID-19: Secondary | ICD-10-CM | POA: Diagnosis not present

## 2019-09-20 DIAGNOSIS — J449 Chronic obstructive pulmonary disease, unspecified: Secondary | ICD-10-CM | POA: Diagnosis not present

## 2019-09-20 DIAGNOSIS — I10 Essential (primary) hypertension: Secondary | ICD-10-CM | POA: Diagnosis not present

## 2019-09-20 DIAGNOSIS — H547 Unspecified visual loss: Secondary | ICD-10-CM | POA: Diagnosis not present

## 2019-09-22 ENCOUNTER — Other Ambulatory Visit: Payer: Self-pay

## 2019-09-22 DIAGNOSIS — I2510 Atherosclerotic heart disease of native coronary artery without angina pectoris: Secondary | ICD-10-CM | POA: Diagnosis not present

## 2019-09-22 DIAGNOSIS — K254 Chronic or unspecified gastric ulcer with hemorrhage: Secondary | ICD-10-CM | POA: Diagnosis not present

## 2019-09-22 DIAGNOSIS — J449 Chronic obstructive pulmonary disease, unspecified: Secondary | ICD-10-CM | POA: Diagnosis not present

## 2019-09-22 DIAGNOSIS — J9610 Chronic respiratory failure, unspecified whether with hypoxia or hypercapnia: Secondary | ICD-10-CM | POA: Diagnosis not present

## 2019-09-22 DIAGNOSIS — U071 COVID-19: Secondary | ICD-10-CM | POA: Diagnosis not present

## 2019-09-22 DIAGNOSIS — I10 Essential (primary) hypertension: Secondary | ICD-10-CM | POA: Diagnosis not present

## 2019-09-22 NOTE — Patient Outreach (Signed)
Telephone assessment:  Placed call to patient who reports she is doing well. Reports no recent bleeding. Reports no change in vision as she is blind due to macular degeneration.  Patient reports she is taking all her medications as prescribed. Reports that she has a caregiver that comes several times a week to assist with her needs.  Patient reports 15 pound weight loss while sick but now is gaining her weight back and denies a loss of appetite.   Patient remains active with home health RN and PT. Reports improved strength.  PLAN: will follow up in 1 week, Encouraged patient to call sooner if needed.  Tomasa Rand, RN, BSN, CEN [facility] [employer] [phone]

## 2019-09-28 ENCOUNTER — Other Ambulatory Visit: Payer: Self-pay

## 2019-09-28 NOTE — Patient Outreach (Signed)
Telephone assessment:  Placed call to patient for final telephone follow up. No answer.  Left a message requesting a call back.  PLAN: will call back in 3 business days if no response.  Tomasa Rand, RN, BSN, CEN [facility] [employer] [phone]

## 2019-09-29 DIAGNOSIS — I2510 Atherosclerotic heart disease of native coronary artery without angina pectoris: Secondary | ICD-10-CM | POA: Diagnosis not present

## 2019-09-29 DIAGNOSIS — J9610 Chronic respiratory failure, unspecified whether with hypoxia or hypercapnia: Secondary | ICD-10-CM | POA: Diagnosis not present

## 2019-09-29 DIAGNOSIS — J449 Chronic obstructive pulmonary disease, unspecified: Secondary | ICD-10-CM | POA: Diagnosis not present

## 2019-09-29 DIAGNOSIS — U071 COVID-19: Secondary | ICD-10-CM | POA: Diagnosis not present

## 2019-09-29 DIAGNOSIS — I10 Essential (primary) hypertension: Secondary | ICD-10-CM | POA: Diagnosis not present

## 2019-09-29 DIAGNOSIS — K254 Chronic or unspecified gastric ulcer with hemorrhage: Secondary | ICD-10-CM | POA: Diagnosis not present

## 2019-09-30 DIAGNOSIS — J449 Chronic obstructive pulmonary disease, unspecified: Secondary | ICD-10-CM | POA: Diagnosis not present

## 2019-09-30 DIAGNOSIS — R000 Tachycardia, unspecified: Secondary | ICD-10-CM | POA: Diagnosis not present

## 2019-09-30 DIAGNOSIS — M62830 Muscle spasm of back: Secondary | ICD-10-CM | POA: Diagnosis not present

## 2019-09-30 DIAGNOSIS — R231 Pallor: Secondary | ICD-10-CM | POA: Diagnosis not present

## 2019-09-30 DIAGNOSIS — J9611 Chronic respiratory failure with hypoxia: Secondary | ICD-10-CM | POA: Diagnosis not present

## 2019-10-03 ENCOUNTER — Other Ambulatory Visit: Payer: Self-pay

## 2019-10-03 NOTE — Patient Outreach (Signed)
Case closure:  Placed call to patient who reports she is doing ok. Reports she has been having muscle spasms in her back. Reports she saw  Her primary MD on 09/30/2019.  Patient reports she is on muscle relaxers.  Patient reports that she has follow up GI appointment virtually this week.  Patient reports she is going well and has not had any bleeding.  Reviewed diet with patient.  PLAN: reviewed case closure with patient and she is in agreement.  Will mail case closure letter and will send MD letter. Encouraged patient to call me for future concerns.  Tomasa Rand, RN, BSN, CEN [facility] [employer] [phone]

## 2019-10-04 DIAGNOSIS — K254 Chronic or unspecified gastric ulcer with hemorrhage: Secondary | ICD-10-CM | POA: Diagnosis not present

## 2019-10-04 DIAGNOSIS — D62 Acute posthemorrhagic anemia: Secondary | ICD-10-CM | POA: Diagnosis not present

## 2019-10-07 DIAGNOSIS — U071 COVID-19: Secondary | ICD-10-CM | POA: Diagnosis not present

## 2019-10-07 DIAGNOSIS — K254 Chronic or unspecified gastric ulcer with hemorrhage: Secondary | ICD-10-CM | POA: Diagnosis not present

## 2019-10-07 DIAGNOSIS — I2510 Atherosclerotic heart disease of native coronary artery without angina pectoris: Secondary | ICD-10-CM | POA: Diagnosis not present

## 2019-10-07 DIAGNOSIS — J449 Chronic obstructive pulmonary disease, unspecified: Secondary | ICD-10-CM | POA: Diagnosis not present

## 2019-10-07 DIAGNOSIS — I10 Essential (primary) hypertension: Secondary | ICD-10-CM | POA: Diagnosis not present

## 2019-10-07 DIAGNOSIS — J9610 Chronic respiratory failure, unspecified whether with hypoxia or hypercapnia: Secondary | ICD-10-CM | POA: Diagnosis not present

## 2019-10-13 DIAGNOSIS — J9610 Chronic respiratory failure, unspecified whether with hypoxia or hypercapnia: Secondary | ICD-10-CM | POA: Diagnosis not present

## 2019-10-13 DIAGNOSIS — I2510 Atherosclerotic heart disease of native coronary artery without angina pectoris: Secondary | ICD-10-CM | POA: Diagnosis not present

## 2019-10-13 DIAGNOSIS — I10 Essential (primary) hypertension: Secondary | ICD-10-CM | POA: Diagnosis not present

## 2019-10-13 DIAGNOSIS — J449 Chronic obstructive pulmonary disease, unspecified: Secondary | ICD-10-CM | POA: Diagnosis not present

## 2019-10-13 DIAGNOSIS — K254 Chronic or unspecified gastric ulcer with hemorrhage: Secondary | ICD-10-CM | POA: Diagnosis not present

## 2019-10-13 DIAGNOSIS — U071 COVID-19: Secondary | ICD-10-CM | POA: Diagnosis not present

## 2019-10-18 DIAGNOSIS — I2510 Atherosclerotic heart disease of native coronary artery without angina pectoris: Secondary | ICD-10-CM | POA: Diagnosis not present

## 2019-10-18 DIAGNOSIS — J449 Chronic obstructive pulmonary disease, unspecified: Secondary | ICD-10-CM | POA: Diagnosis not present

## 2019-10-18 DIAGNOSIS — K254 Chronic or unspecified gastric ulcer with hemorrhage: Secondary | ICD-10-CM | POA: Diagnosis not present

## 2019-10-18 DIAGNOSIS — J9610 Chronic respiratory failure, unspecified whether with hypoxia or hypercapnia: Secondary | ICD-10-CM | POA: Diagnosis not present

## 2019-10-18 DIAGNOSIS — I10 Essential (primary) hypertension: Secondary | ICD-10-CM | POA: Diagnosis not present

## 2019-10-18 DIAGNOSIS — U071 COVID-19: Secondary | ICD-10-CM | POA: Diagnosis not present

## 2019-10-19 DIAGNOSIS — M545 Low back pain: Secondary | ICD-10-CM | POA: Diagnosis not present

## 2019-10-19 DIAGNOSIS — M9905 Segmental and somatic dysfunction of pelvic region: Secondary | ICD-10-CM | POA: Diagnosis not present

## 2019-10-19 DIAGNOSIS — M9902 Segmental and somatic dysfunction of thoracic region: Secondary | ICD-10-CM | POA: Diagnosis not present

## 2019-10-19 DIAGNOSIS — M9903 Segmental and somatic dysfunction of lumbar region: Secondary | ICD-10-CM | POA: Diagnosis not present

## 2019-10-21 DIAGNOSIS — J329 Chronic sinusitis, unspecified: Secondary | ICD-10-CM | POA: Diagnosis not present

## 2019-10-21 DIAGNOSIS — J40 Bronchitis, not specified as acute or chronic: Secondary | ICD-10-CM | POA: Diagnosis not present

## 2019-10-21 DIAGNOSIS — J449 Chronic obstructive pulmonary disease, unspecified: Secondary | ICD-10-CM | POA: Diagnosis not present

## 2019-10-21 DIAGNOSIS — Z6823 Body mass index (BMI) 23.0-23.9, adult: Secondary | ICD-10-CM | POA: Diagnosis not present

## 2019-10-28 ENCOUNTER — Other Ambulatory Visit: Payer: Self-pay

## 2019-10-28 ENCOUNTER — Encounter: Payer: Self-pay | Admitting: Cardiology

## 2019-10-28 ENCOUNTER — Ambulatory Visit (INDEPENDENT_AMBULATORY_CARE_PROVIDER_SITE_OTHER): Payer: Medicare Other | Admitting: Cardiology

## 2019-10-28 VITALS — BP 110/82 | HR 54 | Ht 62.0 in | Wt 127.8 lb

## 2019-10-28 DIAGNOSIS — R0602 Shortness of breath: Secondary | ICD-10-CM | POA: Insufficient documentation

## 2019-10-28 DIAGNOSIS — I739 Peripheral vascular disease, unspecified: Secondary | ICD-10-CM | POA: Diagnosis not present

## 2019-10-28 DIAGNOSIS — J449 Chronic obstructive pulmonary disease, unspecified: Secondary | ICD-10-CM | POA: Diagnosis not present

## 2019-10-28 DIAGNOSIS — R000 Tachycardia, unspecified: Secondary | ICD-10-CM | POA: Diagnosis not present

## 2019-10-28 DIAGNOSIS — Z87891 Personal history of nicotine dependence: Secondary | ICD-10-CM

## 2019-10-28 DIAGNOSIS — R12 Heartburn: Secondary | ICD-10-CM | POA: Insufficient documentation

## 2019-10-28 DIAGNOSIS — I10 Essential (primary) hypertension: Secondary | ICD-10-CM | POA: Diagnosis not present

## 2019-10-28 NOTE — Patient Instructions (Signed)

## 2019-10-28 NOTE — Progress Notes (Signed)
Cardiology Office Note:    Date:  10/28/2019   ID:  Arantza Kellermann, DOB 1946-01-01, MRN [id number]  PCP:  Street, Sharon Mt, MD  Cardiologist:  Jenne Campus, MD    Referring MD: [address] Sharon Mt, *   No chief complaint on file. Doing fine  History of Present Illness:    Kelly Phillips is a 74 y.o. female  I took care of her in 2017.  She was referred to [place] because of tachycardia quite extensive evaluation has been done she was noted to have sinus tachycardia and feeling was that it was related to her COPD, low oxygen.  I lost her from follow-up until she comes today to my office to be reestablished as a patient.  And situation is quite similar she was referred to me because of sinus tachycardia.  She denies having any chest pain tightness squeezing pressure burning chest.  She is just aware of her heart speeding up.  There is no dizziness no passing out.  She did have aortofemoral bypass graft done for complete occlusion of the distal aorta.  She seems to be doing well from that point of view she has been follow-up on the regular basis for it.  Recently in December she ended up coming to the hospital with severe anemia.  Her hemoglobin dropped to 5 she required multiple blood transfusions.  Eventually she got transferred to [facility] when she had EGD done and she was told to have bleeding ulcers.  That being addressed as successful and she seems to be doing well from that point of view.  She comes today to my for follow-up overall seems to be doing for her.  (-Shortness of breath which is chronic complaint.  She did wear monitor which showed only some APCs, PVCs and short runs of nonsustained ventricular tachycardia.  We talked today about options for this situation option being try some medications however she does not want to use any medication for it.  And honestly with her advanced COPD and difficulty breathing beta-blocker which might be able to be very stressful treatment  of this arrhythmia can create some problem with COPD.  She is scheduled to have an echocardiogram this coming Friday will wait for it.  Past Medical History:  Diagnosis Date  . COPD (chronic obstructive pulmonary disease) ([place])   . PAD (peripheral artery disease) ([place])   . SMA stenosis     Past Surgical History:  Procedure Laterality Date  . AORTO-FEMORAL BYPASS GRAFT    . ESOPHAGOGASTRODUODENOSCOPY (EGD) WITH PROPOFOL N/A 07/31/2019   Procedure: ESOPHAGOGASTRODUODENOSCOPY (EGD) WITH PROPOFOL;  Surgeon: Wonda Horner, MD;  Location: [facility] ENDOSCOPY;  Service: Endoscopy;  Laterality: N/A;  . HEMOSTASIS CLIP PLACEMENT  07/31/2019   Procedure: HEMOSTASIS CLIP PLACEMENT;  Surgeon: Wonda Horner, MD;  Location: [facility] ENDOSCOPY;  Service: Endoscopy;;  . SCLEROTHERAPY  07/31/2019   Procedure: Clide Deutscher;  Surgeon: Wonda Horner, MD;  Location: [facility] ENDOSCOPY;  Service: Endoscopy;;    Current Medications: Current Meds  Medication Sig  . ALPRAZolam (XANAX) 0.25 MG tablet Take 0.5-1 tablets (0.125-0.25 mg total) by mouth 2 (two) times daily as needed for anxiety.  Marland Kitchen amLODipine-benazepril (LOTREL) 5-10 MG capsule Take 1 capsule by mouth daily.  . baclofen (LIORESAL) 20 MG tablet Take 10-20 mg by mouth 2 (two) times daily as needed.  . doxycycline (VIBRAMYCIN) 100 MG capsule Take 100 mg by mouth 2 (two) times daily.  Marland Kitchen gabapentin (NEURONTIN) 300 MG capsule TAKE 3 CAPSULES BY  MOUTH AT BEDTIME  . omeprazole (PRILOSEC) 40 MG capsule Take 40 mg by mouth at bedtime.  . polyethylene glycol (MIRALAX / GLYCOLAX) 17 g packet Take 17 g by mouth daily as needed for mild constipation or moderate constipation.     Allergies:   Codeine, Salmon [fish allergy], and Aspirin   Social History   Socioeconomic History  . Marital status: Widowed    Spouse name: Not on file  . Number of children: Not on file  . Years of education: Not on file  . Highest education level: Not on file  Occupational History  . Not on  file  Tobacco Use  . Smoking status: Former Smoker    Quit date: 05/02/2015    Years since quitting: 4.4  . Smokeless tobacco: Never Used  Substance and Sexual Activity  . Alcohol use: Not on file  . Drug use: Never  . Sexual activity: Not Currently  Other Topics Concern  . Not on file  Social History Narrative  . Not on file   Social Determinants of Health   Financial Resource Strain:   . Difficulty of Paying Living Expenses: Not on file  Food Insecurity:   . Worried About Charity fundraiser in the Last Year: Not on file  . Ran Out of Food in the Last Year: Not on file  Transportation Needs:   . Lack of Transportation (Medical): Not on file  . Lack of Transportation (Non-Medical): Not on file  Physical Activity:   . Days of Exercise per Week: Not on file  . Minutes of Exercise per Session: Not on file  Stress:   . Feeling of Stress : Not on file  Social Connections:   . Frequency of Communication with Friends and Family: Not on file  . Frequency of Social Gatherings with Friends and Family: Not on file  . Attends Religious Services: Not on file  . Active Member of Clubs or Organizations: Not on file  . Attends Archivist Meetings: Not on file  . Marital Status: Not on file     Family History: The patient's family history includes Cancer in her father. ROS:   Please see the history of present illness.    All 14 point review of systems negative except as described per history of present illness  EKGs/Labs/Other Studies Reviewed:      Recent Labs: 07/31/2019: ALT 17 08/01/2019: Magnesium 1.9 08/03/2019: BUN 5; Creatinine, Ser 0.68; Potassium 4.5; Sodium 141 08/08/2019: Hemoglobin 10.5; Platelets 137  Recent Lipid Panel No results found for: CHOL, TRIG, HDL, CHOLHDL, VLDL, LDLCALC, LDLDIRECT  Physical Exam:    VS:  BP 110/82   Pulse (!) 54   Ht 5\' 2"  (1.575 m)   Wt 127 lb 12.8 oz (58 kg)   LMP  (LMP Unknown)   SpO2 99%   BMI 23.37 kg/m     Wt  Readings from Last 3 Encounters:  10/28/19 127 lb 12.8 oz (58 kg)  09/09/19 125 lb (56.7 kg)  08/07/19 143 lb 1.3 oz (64.9 kg)     GEN:  Well nourished, well developed in no acute distress HEENT: Normal NECK: No JVD; No carotid bruits LYMPHATICS: No lymphadenopathy CARDIAC: RRR, no murmurs, no rubs, no gallops RESPIRATORY:  Clear to auscultation without rales, wheezing or rhonchi, poor air entry bilaterally ABDOMEN: Soft, non-tender, non-distended MUSCULOSKELETAL:  No edema; No deformity  SKIN: Warm and dry LOWER EXTREMITIES: no swelling NEUROLOGIC:  Alert and oriented x 3 PSYCHIATRIC:  Normal affect  ASSESSMENT:    1. Benign essential hypertension   2. Chronic obstructive pulmonary disease, unspecified COPD type ([place])   3. Tachycardia   4. History of smoking   5. Peripheral vascular disease, unspecified ([place])    PLAN:    In order of problems listed above:  1. Benign essential hypertension blood pressure well controlled continue present medications. 2. COPD followed by 10 medicine team and pulmonary. 3. Tachycardia so far no clinical cardiology for this but I think this is secondary related to COPD and low oxygen.  No significant arrhythmia identified on the monitor.  Awaiting echocardiogram to assess left ventricle ejection fraction. 4. History of peripheral vascular disease not involving proximal limbs well.   Medication Adjustments/Labs and Tests Ordered: Current medicines are reviewed at length with the patient today.  Concerns regarding medicines are outlined above.  No orders of the defined types were placed in this encounter.  Medication changes: No orders of the defined types were placed in this encounter.   Signed, Park Liter, MD, [facility] 10/28/2019 11:59 AM    [place]

## 2019-10-31 DIAGNOSIS — J9611 Chronic respiratory failure with hypoxia: Secondary | ICD-10-CM | POA: Diagnosis not present

## 2019-10-31 DIAGNOSIS — J329 Chronic sinusitis, unspecified: Secondary | ICD-10-CM | POA: Diagnosis not present

## 2019-10-31 DIAGNOSIS — J449 Chronic obstructive pulmonary disease, unspecified: Secondary | ICD-10-CM | POA: Diagnosis not present

## 2019-11-04 ENCOUNTER — Other Ambulatory Visit: Payer: Medicare Other

## 2019-11-14 DIAGNOSIS — H6090 Unspecified otitis externa, unspecified ear: Secondary | ICD-10-CM | POA: Diagnosis not present

## 2019-11-14 DIAGNOSIS — J40 Bronchitis, not specified as acute or chronic: Secondary | ICD-10-CM | POA: Diagnosis not present

## 2019-11-14 DIAGNOSIS — J329 Chronic sinusitis, unspecified: Secondary | ICD-10-CM | POA: Diagnosis not present

## 2019-11-14 DIAGNOSIS — J449 Chronic obstructive pulmonary disease, unspecified: Secondary | ICD-10-CM | POA: Diagnosis not present

## 2019-11-22 DIAGNOSIS — Z23 Encounter for immunization: Secondary | ICD-10-CM | POA: Diagnosis not present

## 2019-12-19 DIAGNOSIS — Z789 Other specified health status: Secondary | ICD-10-CM | POA: Diagnosis not present

## 2019-12-19 DIAGNOSIS — H61303 Acquired stenosis of external ear canal, unspecified, bilateral: Secondary | ICD-10-CM | POA: Diagnosis not present

## 2019-12-19 DIAGNOSIS — H9313 Tinnitus, bilateral: Secondary | ICD-10-CM | POA: Diagnosis not present

## 2019-12-19 DIAGNOSIS — H938X9 Other specified disorders of ear, unspecified ear: Secondary | ICD-10-CM | POA: Diagnosis not present

## 2019-12-20 DIAGNOSIS — Z23 Encounter for immunization: Secondary | ICD-10-CM | POA: Diagnosis not present

## 2019-12-22 ENCOUNTER — Other Ambulatory Visit: Payer: Medicare Other

## 2019-12-24 DIAGNOSIS — H66009 Acute suppurative otitis media without spontaneous rupture of ear drum, unspecified ear: Secondary | ICD-10-CM | POA: Diagnosis not present

## 2019-12-24 DIAGNOSIS — J0100 Acute maxillary sinusitis, unspecified: Secondary | ICD-10-CM | POA: Diagnosis not present

## 2020-01-19 DIAGNOSIS — H9319 Tinnitus, unspecified ear: Secondary | ICD-10-CM | POA: Diagnosis not present

## 2020-01-19 DIAGNOSIS — H903 Sensorineural hearing loss, bilateral: Secondary | ICD-10-CM | POA: Diagnosis not present

## 2020-01-19 DIAGNOSIS — Z789 Other specified health status: Secondary | ICD-10-CM | POA: Diagnosis not present

## 2020-01-19 DIAGNOSIS — H905 Unspecified sensorineural hearing loss: Secondary | ICD-10-CM | POA: Diagnosis not present

## 2020-01-25 ENCOUNTER — Other Ambulatory Visit: Payer: Self-pay | Admitting: Gastroenterology

## 2020-01-25 DIAGNOSIS — Z8709 Personal history of other diseases of the respiratory system: Secondary | ICD-10-CM | POA: Diagnosis not present

## 2020-01-25 DIAGNOSIS — Z8711 Personal history of peptic ulcer disease: Secondary | ICD-10-CM | POA: Diagnosis not present

## 2020-01-27 ENCOUNTER — Ambulatory Visit: Payer: Medicare Other | Admitting: Cardiology

## 2020-03-02 ENCOUNTER — Other Ambulatory Visit (HOSPITAL_COMMUNITY)
Admission: RE | Admit: 2020-03-02 | Discharge: 2020-03-02 | Disposition: A | Discharge status: Home / Self Care | Payer: Medicare Other | Source: Ambulatory Visit | Attending: Gastroenterology | Admitting: Gastroenterology

## 2020-03-02 DIAGNOSIS — Z20822 Contact with and (suspected) exposure to covid-19: Secondary | ICD-10-CM | POA: Insufficient documentation

## 2020-03-02 DIAGNOSIS — Z01812 Encounter for preprocedural laboratory examination: Secondary | ICD-10-CM | POA: Insufficient documentation

## 2020-03-02 LAB — SARS CORONAVIRUS 2 (TAT 6-24 HRS): SARS Coronavirus 2: NEGATIVE

## 2020-03-06 ENCOUNTER — Ambulatory Visit (HOSPITAL_COMMUNITY): Payer: Medicare Other | Admitting: Anesthesiology

## 2020-03-06 ENCOUNTER — Encounter (HOSPITAL_COMMUNITY)
Admission: RE | Disposition: A | Discharge status: Home / Self Care | Payer: Self-pay | Source: Home / Self Care | Attending: Gastroenterology

## 2020-03-06 ENCOUNTER — Other Ambulatory Visit: Payer: Self-pay

## 2020-03-06 ENCOUNTER — Ambulatory Visit (HOSPITAL_COMMUNITY)
Admission: RE | Admit: 2020-03-06 | Discharge: 2020-03-06 | Disposition: A | Discharge status: Home / Self Care | Payer: Medicare Other | Attending: Gastroenterology | Admitting: Gastroenterology

## 2020-03-06 ENCOUNTER — Encounter (HOSPITAL_COMMUNITY): Payer: Self-pay | Admitting: Gastroenterology

## 2020-03-06 DIAGNOSIS — Z9981 Dependence on supplemental oxygen: Secondary | ICD-10-CM | POA: Insufficient documentation

## 2020-03-06 DIAGNOSIS — K222 Esophageal obstruction: Secondary | ICD-10-CM | POA: Diagnosis not present

## 2020-03-06 DIAGNOSIS — K3189 Other diseases of stomach and duodenum: Secondary | ICD-10-CM | POA: Diagnosis not present

## 2020-03-06 DIAGNOSIS — K449 Diaphragmatic hernia without obstruction or gangrene: Secondary | ICD-10-CM | POA: Diagnosis not present

## 2020-03-06 DIAGNOSIS — M1990 Unspecified osteoarthritis, unspecified site: Secondary | ICD-10-CM | POA: Insufficient documentation

## 2020-03-06 DIAGNOSIS — Z8711 Personal history of peptic ulcer disease: Secondary | ICD-10-CM | POA: Diagnosis not present

## 2020-03-06 DIAGNOSIS — Z91013 Allergy to seafood: Secondary | ICD-10-CM | POA: Diagnosis not present

## 2020-03-06 DIAGNOSIS — I739 Peripheral vascular disease, unspecified: Secondary | ICD-10-CM | POA: Diagnosis not present

## 2020-03-06 DIAGNOSIS — Z886 Allergy status to analgesic agent status: Secondary | ICD-10-CM | POA: Diagnosis not present

## 2020-03-06 DIAGNOSIS — K2950 Unspecified chronic gastritis without bleeding: Secondary | ICD-10-CM | POA: Insufficient documentation

## 2020-03-06 DIAGNOSIS — I10 Essential (primary) hypertension: Secondary | ICD-10-CM | POA: Insufficient documentation

## 2020-03-06 DIAGNOSIS — J449 Chronic obstructive pulmonary disease, unspecified: Secondary | ICD-10-CM | POA: Insufficient documentation

## 2020-03-06 DIAGNOSIS — K2970 Gastritis, unspecified, without bleeding: Secondary | ICD-10-CM | POA: Diagnosis not present

## 2020-03-06 DIAGNOSIS — Z87891 Personal history of nicotine dependence: Secondary | ICD-10-CM | POA: Insufficient documentation

## 2020-03-06 DIAGNOSIS — K228 Other specified diseases of esophagus: Secondary | ICD-10-CM | POA: Diagnosis not present

## 2020-03-06 DIAGNOSIS — D649 Anemia, unspecified: Secondary | ICD-10-CM | POA: Diagnosis not present

## 2020-03-06 DIAGNOSIS — K311 Adult hypertrophic pyloric stenosis: Secondary | ICD-10-CM | POA: Insufficient documentation

## 2020-03-06 DIAGNOSIS — Z885 Allergy status to narcotic agent status: Secondary | ICD-10-CM | POA: Insufficient documentation

## 2020-03-06 DIAGNOSIS — Z79899 Other long term (current) drug therapy: Secondary | ICD-10-CM | POA: Insufficient documentation

## 2020-03-06 DIAGNOSIS — K2270 Barrett's esophagus without dysplasia: Secondary | ICD-10-CM | POA: Insufficient documentation

## 2020-03-06 DIAGNOSIS — K319 Disease of stomach and duodenum, unspecified: Secondary | ICD-10-CM | POA: Insufficient documentation

## 2020-03-06 DIAGNOSIS — Z7951 Long term (current) use of inhaled steroids: Secondary | ICD-10-CM | POA: Insufficient documentation

## 2020-03-06 DIAGNOSIS — K259 Gastric ulcer, unspecified as acute or chronic, without hemorrhage or perforation: Secondary | ICD-10-CM | POA: Diagnosis not present

## 2020-03-06 DIAGNOSIS — H548 Legal blindness, as defined in USA: Secondary | ICD-10-CM | POA: Diagnosis not present

## 2020-03-06 HISTORY — PX: BIOPSY: SHX5522

## 2020-03-06 HISTORY — PX: ESOPHAGOGASTRODUODENOSCOPY (EGD) WITH PROPOFOL: SHX5813

## 2020-03-06 SURGERY — ESOPHAGOGASTRODUODENOSCOPY (EGD) WITH PROPOFOL
Anesthesia: Monitor Anesthesia Care

## 2020-03-06 MED ORDER — PROPOFOL 500 MG/50ML IV EMUL
INTRAVENOUS | Status: AC
  Filled 2020-03-06: qty 50

## 2020-03-06 MED ORDER — PROPOFOL 10 MG/ML IV BOLUS
INTRAVENOUS | Status: AC
  Filled 2020-03-06: qty 20

## 2020-03-06 MED ORDER — PROPOFOL 500 MG/50ML IV EMUL
INTRAVENOUS | Status: DC | PRN
  Administered 2020-03-06: 100 ug/kg/min via INTRAVENOUS

## 2020-03-06 MED ORDER — PROPOFOL 500 MG/50ML IV EMUL
INTRAVENOUS | Status: DC | PRN
  Administered 2020-03-06: 25 mg via INTRAVENOUS

## 2020-03-06 MED ORDER — PHENYLEPHRINE HCL (PRESSORS) 10 MG/ML IV SOLN
INTRAVENOUS | Status: DC | PRN
  Administered 2020-03-06: 80 ug via INTRAVENOUS
  Administered 2020-03-06: 120 ug via INTRAVENOUS

## 2020-03-06 MED ORDER — OMEPRAZOLE 40 MG PO CPDR: 40.0000 mg | DELAYED_RELEASE_CAPSULE | Freq: Every day | ORAL | 1 refills | Status: DC

## 2020-03-06 MED ORDER — LACTATED RINGERS IV SOLN: INTRAVENOUS | Status: DC

## 2020-03-06 SURGICAL SUPPLY — 14 items

## 2020-03-06 NOTE — Transfer of Care (Signed)
Immediate Anesthesia Transfer of Care Note  Patient: Kelly Phillips  Procedure(s) Performed: ESOPHAGOGASTRODUODENOSCOPY (EGD) WITH PROPOFOL (N/A ) BIOPSY  Patient Location: PACU  Anesthesia Type:MAC  Level of Consciousness: drowsy, patient cooperative and responds to stimulation  Airway & Oxygen Therapy: Patient Spontanous Breathing and Patient connected to face mask oxygen  Post-op Assessment: Report given to RN and Post -op Vital signs reviewed and stable  Post vital signs: Reviewed and stable  Last Vitals:  Vitals Value Taken Time  BP 102/55 03/06/20 1206  Temp    Pulse 91 03/06/20 1207  Resp 16 03/06/20 1207  SpO2 100 % 03/06/20 1207  Vitals shown include unvalidated device data.  Last Pain:  Vitals:   03/06/20 1038  TempSrc: Oral  PainSc: 0-No pain         Complications: No complications documented.

## 2020-03-06 NOTE — Op Note (Signed)
[facility] Patient Name: Magdalynn Phillips Procedure Date: 03/06/2020 MRN: [id number] Attending MD: Otis Brace , MD Date of Birth: 10-27-1945 CSN: [id number] Age: 74 Admit Type: Outpatient Procedure:                Upper GI endoscopy Indications:              Follow-up of gastric ulcer Providers:                Otis Brace, MD, William Dalton, Technician,                            Tyna Jaksch Technician, Josie Dixon, RN Referring MD:              Medicines:                Sedation Administered by an Anesthesia Professional Complications:            No immediate complications. Estimated Blood Loss:     Estimated blood loss was minimal. Procedure:                After obtaining informed consent, the endoscope was                            passed under direct vision. Throughout the                            procedure, the patient's blood pressure, pulse, and                            oxygen saturations were monitored continuously. The                            GIF-H190 ([id number]) Olympus gastroscope was                            introduced through the mouth, and advanced to the                            pylorus. The GIF-[id number] ([id number]) Olympus ultra                            slim endoscope was introduced through the mouth,                            and advanced to the second part of duodenum. The                            upper GI endoscopy was technically difficult and                            complex due to stenosis. Successful completion of                            the procedure was aided by withdrawing the scope  and replacing with the pediatric endoscope. The                            patient tolerated the procedure well. Scope In: Scope Out: Findings:      The Z-line was irregular and was found 35 cm from the incisors. Biopsies       were taken with a cold forceps for histology.      A non-obstructing  Schatzki ring was found at the gastroesophageal       junction.      A 4 cm hiatal hernia was present.      Scattered mild inflammation was found in the gastric antrum. Biopsies       were taken with a cold forceps for histology.      A benign-appearing, intrinsic severe stenosis was found at the pylorus.       This was traversed after withdrawing the scope and replacing with the       pediatric endoscope.      The duodenal bulb, first portion of the duodenum and second portion of       the duodenum were normal. Impression:               - Z-line irregular, 35 cm from the incisors.                            Biopsied.                           - Non-obstructing Schatzki ring.                           - 4 cm hiatal hernia.                           - Gastritis. Biopsied.                           - Gastric stenosis was found at the pylorus.                           - Normal duodenal bulb, first portion of the                            duodenum and second portion of the duodenum. Moderate Sedation:      Moderate (conscious) sedation was personally administered by an       anesthesia professional. The following parameters were monitored: oxygen       saturation, heart rate, blood pressure, and response to care. Recommendation:           - Patient has a contact number available for                            emergencies. The signs and symptoms of potential                            delayed complications were discussed with the  patient. Return to normal activities tomorrow.                            Written discharge instructions were provided to the                            patient.                           - Resume previous diet.                           - Continue present medications.                           - Await pathology results.                           - Return to my office in 6 months. Procedure Code(s):        --- Professional ---                            [phone], Esophagogastroduodenoscopy, flexible,                            transoral; with biopsy, single or multiple Diagnosis Code(s):        --- Professional ---                           K22.8, Other specified diseases of esophagus                           K22.2, Esophageal obstruction                           K44.9, Diaphragmatic hernia without obstruction or                            gangrene                           K29.70, Gastritis, unspecified, without bleeding                           K31.1, Adult hypertrophic pyloric stenosis                           K25.9, Gastric ulcer, unspecified as acute or                            chronic, without hemorrhage or perforation CPT copyright 2019 American Medical Association. All rights reserved. The codes documented in this report are preliminary and upon coder review may  be revised to meet current compliance requirements. Otis Brace, MD Otis Brace, MD 03/06/2020 12:07:14 PM Number of Addenda: 0

## 2020-03-06 NOTE — Discharge Instructions (Signed)
Monitored Anesthesia Care, Care After These instructions provide you with information about caring for yourself after your procedure. Your health care provider may also give you more specific instructions. Your treatment has been planned according to current medical practices, but problems sometimes occur. Call your health care provider if you have any problems or questions after your procedure. What can I expect after the procedure? After your procedure, you may:  Feel sleepy for several hours.  Feel clumsy and have poor balance for several hours.  Feel forgetful about what happened after the procedure.  Have poor judgment for several hours.  Feel nauseous or vomit.  Have a sore throat if you had a breathing tube during the procedure. Follow these instructions at home: For at least 24 hours after the procedure:      Have a responsible adult stay with you. It is important to have someone help care for you until you are awake and alert.  Rest as needed.  Do not: ? Participate in activities in which you could fall or become injured. ? Drive. ? Use heavy machinery. ? Drink alcohol. ? Take sleeping pills or medicines that cause drowsiness. ? Make important decisions or sign legal documents. ? Take care of children on your own. Eating and drinking  Follow the diet that is recommended by your health care provider.  If you vomit, drink water, juice, or soup when you can drink without vomiting.  Make sure you have little or no nausea before eating solid foods. General instructions  Take over-the-counter and prescription medicines only as told by your health care provider.  If you have sleep apnea, surgery and certain medicines can increase your risk for breathing problems. Follow instructions from your health care provider about wearing your sleep device: ? Anytime you are sleeping, including during daytime naps. ? While taking prescription pain medicines, sleeping medicines,  or medicines that make you drowsy.  If you smoke, do not smoke without supervision.  Keep all follow-up visits as told by your health care provider. This is important. Contact a health care provider if:  You keep feeling nauseous or you keep vomiting.  You feel light-headed.  You develop a rash.  You have a fever. Get help right away if:  You have trouble breathing. Summary  For several hours after your procedure, you may feel sleepy and have poor judgment.  Have a responsible adult stay with you for at least 24 hours or until you are awake and alert. This information is not intended to replace advice given to you by your health care provider. Make sure you discuss any questions you have with your health care provider. Document Revised: 11/09/2017 Document Reviewed: 12/02/2015 Elsevier Patient Education  2020 Elsevier Inc. Upper Endoscopy, Adult, Care After This sheet gives you information about how to care for yourself after your procedure. Your health care provider may also give you more specific instructions. If you have problems or questions, contact your health care provider. What can I expect after the procedure? After the procedure, it is common to have:  A sore throat.  Mild stomach pain or discomfort.  Bloating.  Nausea. Follow these instructions at home:   Follow instructions from your health care provider about what to eat or drink after your procedure.  Return to your normal activities as told by your health care provider. Ask your health care provider what activities are safe for you.  Take over-the-counter and prescription medicines only as told by your health care provider.    Do not drive for 24 hours if you were given a sedative during your procedure.  Keep all follow-up visits as told by your health care provider. This is important. Contact a health care provider if you have:  A sore throat that lasts longer than one day.  Trouble swallowing. Get  help right away if:  You vomit blood or your vomit looks like coffee grounds.  You have: ? A fever. ? Bloody, black, or tarry stools. ? A severe sore throat or you cannot swallow. ? Difficulty breathing. ? Severe pain in your chest or abdomen. Summary  After the procedure, it is common to have a sore throat, mild stomach discomfort, bloating, and nausea.  Do not drive for 24 hours if you were given a sedative during the procedure.  Follow instructions from your health care provider about what to eat or drink after your procedure.  Return to your normal activities as told by your health care provider. This information is not intended to replace advice given to you by your health care provider. Make sure you discuss any questions you have with your health care provider. Document Revised: 02/02/2018 Document Reviewed: 01/11/2018 Elsevier Patient Education  2020 Elsevier Inc.  

## 2020-03-06 NOTE — Anesthesia Preprocedure Evaluation (Addendum)
Anesthesia Evaluation  Patient identified by MRN, date of birth, ID band Patient awake    Reviewed: Allergy & Precautions, H&P , NPO status , Patient's Chart, lab work & pertinent test results  Airway Mallampati: II   Neck ROM: full    Dental  (+) Teeth Intact, Dental Advisory Given   Pulmonary COPD,  COPD inhaler, Current Smoker, former smoker,    breath sounds clear to auscultation       Cardiovascular hypertension, Pt. on medications + Peripheral Vascular Disease   Rhythm:regular Rate:Normal     Neuro/Psych    GI/Hepatic GI bleeding   Endo/Other    Renal/GU      Musculoskeletal  (+) Arthritis ,   Abdominal   Peds  Hematology  (+) Blood dyscrasia, anemia ,   Anesthesia Other Findings   Reproductive/Obstetrics                           Anesthesia Physical  Anesthesia Plan  ASA: III  Anesthesia Plan: MAC   Post-op Pain Management:    Induction: Intravenous  PONV Risk Score and Plan: 2 and Ondansetron, Dexamethasone and Treatment may vary due to age or medical condition  Airway Management Planned: Nasal Cannula, Simple Face Mask and Mask  Additional Equipment:   Intra-op Plan:   Post-operative Plan: Extubation in OR  Informed Consent: I have reviewed the patients History and Physical, chart, labs and discussed the procedure including the risks, benefits and alternatives for the proposed anesthesia with the patient or authorized representative who has indicated his/her understanding and acceptance.       Plan Discussed with: CRNA, Anesthesiologist and Surgeon  Anesthesia Plan Comments:        Anesthesia Quick Evaluation

## 2020-03-06 NOTE — Anesthesia Procedure Notes (Signed)
Date/Time: 03/06/2020 11:40 AM Performed by: Glory Buff, CRNA Oxygen Delivery Method: Simple face mask

## 2020-03-06 NOTE — H&P (Signed)
Primary Care Physician:  Street, Sharon Mt, MD Primary Gastroenterologist: [facility]  Reason for Visit : Follow-up on ulcer disease  HPI: Kelly Phillips is a 74 y.o. female here for EGD for follow-up on ulcer disease.  She was admitted to the hospital in December 2020 with anemia and melena.  EGD showed large prepyloric ulcer extending into the duodenal bulb with visible vessel.  It was treated endoscopically.  Patient was discharged home on PPI.  Patient is not sure if she is currently taking PPI or not.  Patient is legally blind.  She is not able to see color of her stool.  She denies any abdominal pain, nausea or vomiting.  Denies reflux, dysphagia and odynophagia.  Denies NSAID use.  Past Medical History:  Diagnosis Date  . COPD (chronic obstructive pulmonary disease) ([place])   . PAD (peripheral artery disease) ([place])   . SMA stenosis     Past Surgical History:  Procedure Laterality Date  . AORTO-FEMORAL BYPASS GRAFT    . ESOPHAGOGASTRODUODENOSCOPY (EGD) WITH PROPOFOL N/A 07/31/2019   Procedure: ESOPHAGOGASTRODUODENOSCOPY (EGD) WITH PROPOFOL;  Surgeon: Wonda Horner, MD;  Location: [facility] ENDOSCOPY;  Service: Endoscopy;  Laterality: N/A;  . HEMOSTASIS CLIP PLACEMENT  07/31/2019   Procedure: HEMOSTASIS CLIP PLACEMENT;  Surgeon: Wonda Horner, MD;  Location: [facility] ENDOSCOPY;  Service: Endoscopy;;  . Clide Deutscher  07/31/2019   Procedure: SCLEROTHERAPY;  Surgeon: Wonda Horner, MD;  Location: [facility] ENDOSCOPY;  Service: Endoscopy;;    Prior to Admission medications   Medication Sig Start Date End Date Taking? Authorizing Provider  ALPRAZolam Duanne Moron) 0.5 MG tablet Take 0.25-0.5 mg by mouth See admin instructions. 0.25 mg in the morning, and 0.5 mg at bedtime 01/25/20  Yes [provider]  amLODipine-benazepril (LOTREL) 5-10 MG capsule Take 1 capsule by mouth daily. 09/01/19  Yes [provider]  docusate sodium (COLACE) 100 MG capsule Take 100 mg by mouth every other day.   Yes  [provider]  fluticasone (FLOVENT HFA) 110 MCG/ACT inhaler Inhale 2 puffs into the lungs in the morning and at bedtime.   Yes [provider]  gabapentin (NEURONTIN) 300 MG capsule Take 900 mg by mouth at bedtime.  11/28/15  Yes [provider]  omeprazole (PRILOSEC) 40 MG capsule Take 40 mg by mouth at bedtime. 08/31/19  Yes [provider]  OXYGEN Inhale 3 L into the lungs continuous.   Yes [provider]  polyethylene glycol (MIRALAX / GLYCOLAX) 17 g packet Take 17 g by mouth daily as needed for mild constipation or moderate constipation. 08/09/19  Yes Mariel Aloe, MD    Scheduled Meds: Continuous Infusions: . lactated ringers 20 mL/hr at 03/06/20 1053   PRN Meds:.  Allergies as of 01/25/2020 - Review Complete 10/28/2019  Allergen Reaction Noted  . Codeine Swelling 04/04/2015  . Salmon [fish allergy] Anaphylaxis and Swelling 04/04/2015  . Aspirin Tinitus 04/04/2015    Family History  Problem Relation Age of Onset  . Cancer Father     Social History   Socioeconomic History  . Marital status: Widowed    Spouse name: Not on file  . Number of children: Not on file  . Years of education: Not on file  . Highest education level: Not on file  Occupational History  . Not on file  Tobacco Use  . Smoking status: Former Smoker    Quit date: 05/02/2015    Years since quitting: 4.8  . Smokeless tobacco: Never Used  Vaping Use  .  Vaping Use: Never used  Substance and Sexual Activity  . Alcohol use: Not on file  . Drug use: Never  . Sexual activity: Not Currently  Other Topics Concern  . Not on file  Social History Narrative  . Not on file   Social Determinants of Health   Financial Resource Strain:   . Difficulty of Paying Living Expenses:   Food Insecurity:   . Worried About Charity fundraiser in the Last Year:   . Arboriculturist in the Last Year:   Transportation Needs:   . Film/video editor (Medical):   Marland Kitchen Lack  of Transportation (Non-Medical):   Physical Activity:   . Days of Exercise per Week:   . Minutes of Exercise per Session:   Stress:   . Feeling of Stress :   Social Connections:   . Frequency of Communication with Friends and Family:   . Frequency of Social Gatherings with Friends and Family:   . Attends Religious Services:   . Active Member of Clubs or Organizations:   . Attends Archivist Meetings:   Marland Kitchen Marital Status:   Intimate Partner Violence:   . Fear of Current or Ex-Partner:   . Emotionally Abused:   Marland Kitchen Physically Abused:   . Sexually Abused:     Review of Systems: All negative except as stated above in HPI.  Physical Exam: Vital signs: Vitals:   03/06/20 1038  BP: (!) 142/93  Pulse: 93  Resp: 15  Temp: 97.6 F (36.4 C)  SpO2: 100%     General:   Alert,  Well-developed, well-nourished, pleasant and cooperative in NAD Lungs:  Clear throughout to auscultation.   No wheezes, crackles, or rhonchi. No acute distress. Heart:  Regular rate and rhythm; no murmurs, clicks, rubs,  or gallops. Abdomen: Soft, nontender, nondistended, bowel sounds present. Rectal:  Deferred  GI:  Lab Results: No results for input(s): WBC, HGB, HCT, PLT in the last 72 hours. BMET No results for input(s): NA, K, CL, CO2, GLUCOSE, BUN, CREATININE, CALCIUM in the last 72 hours. LFT No results for input(s): PROT, ALBUMIN, AST, ALT, ALKPHOS, BILITOT, BILIDIR, IBILI in the last 72 hours. PT/INR No results for input(s): LABPROT, INR in the last 72 hours.   Studies/Results: No results found.  Impression/Plan: -Large gastric ulcer extending into the duodenal bulb.  Diagnosed in December 2020.  Patient is not sure if she is taking PPI or not.  Recommendations ---------------------- -Proceed with EGD to document healing of large ulcer.  Risks (bleeding, infection, bowel perforation that could require surgery, sedation-related changes in cardiopulmonary systems), benefits  (identification and possible treatment of source of symptoms, exclusion of certain causes of symptoms), and alternatives (watchful waiting, radiographic imaging studies, empiric medical treatment)  were explained to patient/family in detail and patient wishes to proceed.    LOS: 0 days   Otis Brace  MD, FACP 03/06/2020, 11:15 AM  Contact #  [phone]

## 2020-03-06 NOTE — Anesthesia Postprocedure Evaluation (Signed)
Anesthesia Post Note  Patient: Kelly Phillips  Procedure(s) Performed: ESOPHAGOGASTRODUODENOSCOPY (EGD) WITH PROPOFOL (N/A ) BIOPSY     Patient location during evaluation: PACU Anesthesia Type: MAC Level of consciousness: awake and alert Pain management: pain level controlled Vital Signs Assessment: post-procedure vital signs reviewed and stable Respiratory status: spontaneous breathing, nonlabored ventilation, respiratory function stable and patient connected to nasal cannula oxygen Cardiovascular status: stable and blood pressure returned to baseline Postop Assessment: no apparent nausea or vomiting Anesthetic complications: no   No complications documented.  Last Vitals:  Vitals:   03/06/20 1210 03/06/20 1220  BP: 117/78 (!) 154/85  Pulse: 95 86  Resp: 20 12  Temp:  36.7 C  SpO2: 100% 96%    Last Pain:  Vitals:   03/06/20 1220  TempSrc: Other (Comment)  PainSc: 0-No pain                 Emmylou Bieker

## 2020-03-07 ENCOUNTER — Other Ambulatory Visit: Payer: Self-pay

## 2020-03-07 LAB — SURGICAL PATHOLOGY

## 2020-03-09 ENCOUNTER — Encounter (HOSPITAL_COMMUNITY): Payer: Self-pay | Admitting: Gastroenterology

## 2020-04-19 DIAGNOSIS — J449 Chronic obstructive pulmonary disease, unspecified: Secondary | ICD-10-CM | POA: Diagnosis not present

## 2020-04-19 DIAGNOSIS — J439 Emphysema, unspecified: Secondary | ICD-10-CM | POA: Diagnosis not present

## 2020-04-19 DIAGNOSIS — R0602 Shortness of breath: Secondary | ICD-10-CM | POA: Diagnosis not present

## 2020-04-19 DIAGNOSIS — R0902 Hypoxemia: Secondary | ICD-10-CM | POA: Diagnosis not present

## 2020-04-19 DIAGNOSIS — R911 Solitary pulmonary nodule: Secondary | ICD-10-CM | POA: Diagnosis not present

## 2020-04-19 DIAGNOSIS — J90 Pleural effusion, not elsewhere classified: Secondary | ICD-10-CM | POA: Diagnosis not present

## 2020-05-07 DIAGNOSIS — E785 Hyperlipidemia, unspecified: Secondary | ICD-10-CM | POA: Diagnosis not present

## 2020-05-07 DIAGNOSIS — J449 Chronic obstructive pulmonary disease, unspecified: Secondary | ICD-10-CM | POA: Diagnosis not present

## 2020-05-07 DIAGNOSIS — R739 Hyperglycemia, unspecified: Secondary | ICD-10-CM | POA: Diagnosis not present

## 2020-05-07 DIAGNOSIS — Z79899 Other long term (current) drug therapy: Secondary | ICD-10-CM | POA: Diagnosis not present

## 2020-05-07 DIAGNOSIS — K2950 Unspecified chronic gastritis without bleeding: Secondary | ICD-10-CM | POA: Diagnosis not present

## 2020-05-07 DIAGNOSIS — G629 Polyneuropathy, unspecified: Secondary | ICD-10-CM | POA: Diagnosis not present

## 2020-05-07 DIAGNOSIS — K2270 Barrett's esophagus without dysplasia: Secondary | ICD-10-CM | POA: Diagnosis not present

## 2020-05-07 DIAGNOSIS — E559 Vitamin D deficiency, unspecified: Secondary | ICD-10-CM | POA: Diagnosis not present

## 2020-05-07 DIAGNOSIS — J9611 Chronic respiratory failure with hypoxia: Secondary | ICD-10-CM | POA: Diagnosis not present

## 2020-05-11 DIAGNOSIS — E538 Deficiency of other specified B group vitamins: Secondary | ICD-10-CM | POA: Diagnosis not present

## 2020-05-18 DIAGNOSIS — E538 Deficiency of other specified B group vitamins: Secondary | ICD-10-CM | POA: Diagnosis not present

## 2020-05-25 DIAGNOSIS — E538 Deficiency of other specified B group vitamins: Secondary | ICD-10-CM | POA: Diagnosis not present

## 2020-06-01 DIAGNOSIS — D519 Vitamin B12 deficiency anemia, unspecified: Secondary | ICD-10-CM | POA: Diagnosis not present

## 2020-06-06 DIAGNOSIS — N390 Urinary tract infection, site not specified: Secondary | ICD-10-CM | POA: Diagnosis not present

## 2020-06-14 DIAGNOSIS — I70213 Atherosclerosis of native arteries of extremities with intermittent claudication, bilateral legs: Secondary | ICD-10-CM | POA: Diagnosis not present

## 2020-06-14 DIAGNOSIS — I771 Stricture of artery: Secondary | ICD-10-CM | POA: Diagnosis not present

## 2020-06-14 DIAGNOSIS — G458 Other transient cerebral ischemic attacks and related syndromes: Secondary | ICD-10-CM | POA: Diagnosis not present

## 2020-06-14 DIAGNOSIS — I6523 Occlusion and stenosis of bilateral carotid arteries: Secondary | ICD-10-CM | POA: Diagnosis not present

## 2020-06-14 DIAGNOSIS — I10 Essential (primary) hypertension: Secondary | ICD-10-CM | POA: Diagnosis not present

## 2020-06-14 DIAGNOSIS — Z87891 Personal history of nicotine dependence: Secondary | ICD-10-CM | POA: Diagnosis not present

## 2020-06-14 DIAGNOSIS — I739 Peripheral vascular disease, unspecified: Secondary | ICD-10-CM | POA: Diagnosis not present

## 2020-06-14 DIAGNOSIS — Z95828 Presence of other vascular implants and grafts: Secondary | ICD-10-CM | POA: Diagnosis not present

## 2020-06-14 DIAGNOSIS — K551 Chronic vascular disorders of intestine: Secondary | ICD-10-CM | POA: Diagnosis not present

## 2020-06-29 DIAGNOSIS — M545 Low back pain, unspecified: Secondary | ICD-10-CM | POA: Diagnosis not present

## 2020-06-29 DIAGNOSIS — M9902 Segmental and somatic dysfunction of thoracic region: Secondary | ICD-10-CM | POA: Diagnosis not present

## 2020-06-29 DIAGNOSIS — M9905 Segmental and somatic dysfunction of pelvic region: Secondary | ICD-10-CM | POA: Diagnosis not present

## 2020-06-29 DIAGNOSIS — M9903 Segmental and somatic dysfunction of lumbar region: Secondary | ICD-10-CM | POA: Diagnosis not present

## 2020-07-09 DIAGNOSIS — E538 Deficiency of other specified B group vitamins: Secondary | ICD-10-CM | POA: Diagnosis not present

## 2020-07-09 DIAGNOSIS — Z6824 Body mass index (BMI) 24.0-24.9, adult: Secondary | ICD-10-CM | POA: Diagnosis not present

## 2020-07-09 DIAGNOSIS — L03122 Acute lymphangitis of left axilla: Secondary | ICD-10-CM | POA: Diagnosis not present

## 2020-07-17 DIAGNOSIS — L03122 Acute lymphangitis of left axilla: Secondary | ICD-10-CM | POA: Diagnosis not present

## 2020-07-17 DIAGNOSIS — I5032 Chronic diastolic (congestive) heart failure: Secondary | ICD-10-CM | POA: Diagnosis not present

## 2020-07-17 DIAGNOSIS — I70213 Atherosclerosis of native arteries of extremities with intermittent claudication, bilateral legs: Secondary | ICD-10-CM | POA: Diagnosis not present

## 2020-07-17 DIAGNOSIS — I110 Hypertensive heart disease with heart failure: Secondary | ICD-10-CM | POA: Diagnosis not present

## 2020-07-21 DIAGNOSIS — I2510 Atherosclerotic heart disease of native coronary artery without angina pectoris: Secondary | ICD-10-CM | POA: Diagnosis not present

## 2020-07-21 DIAGNOSIS — K5730 Diverticulosis of large intestine without perforation or abscess without bleeding: Secondary | ICD-10-CM | POA: Diagnosis not present

## 2020-07-21 DIAGNOSIS — I252 Old myocardial infarction: Secondary | ICD-10-CM | POA: Diagnosis not present

## 2020-07-21 DIAGNOSIS — R200 Anesthesia of skin: Secondary | ICD-10-CM | POA: Diagnosis not present

## 2020-07-21 DIAGNOSIS — Z79899 Other long term (current) drug therapy: Secondary | ICD-10-CM | POA: Diagnosis not present

## 2020-07-21 DIAGNOSIS — F419 Anxiety disorder, unspecified: Secondary | ICD-10-CM | POA: Diagnosis not present

## 2020-07-21 DIAGNOSIS — R208 Other disturbances of skin sensation: Secondary | ICD-10-CM | POA: Diagnosis not present

## 2020-07-21 DIAGNOSIS — I708 Atherosclerosis of other arteries: Secondary | ICD-10-CM | POA: Diagnosis not present

## 2020-07-21 DIAGNOSIS — K5900 Constipation, unspecified: Secondary | ICD-10-CM | POA: Diagnosis not present

## 2020-07-21 DIAGNOSIS — Z87891 Personal history of nicotine dependence: Secondary | ICD-10-CM | POA: Diagnosis not present

## 2020-07-21 DIAGNOSIS — I10 Essential (primary) hypertension: Secondary | ICD-10-CM | POA: Diagnosis not present

## 2020-07-21 DIAGNOSIS — M5126 Other intervertebral disc displacement, lumbar region: Secondary | ICD-10-CM | POA: Diagnosis not present

## 2020-07-21 DIAGNOSIS — M48061 Spinal stenosis, lumbar region without neurogenic claudication: Secondary | ICD-10-CM | POA: Diagnosis not present

## 2020-07-21 DIAGNOSIS — R202 Paresthesia of skin: Secondary | ICD-10-CM | POA: Diagnosis not present

## 2020-07-21 DIAGNOSIS — R5381 Other malaise: Secondary | ICD-10-CM | POA: Diagnosis not present

## 2020-07-21 DIAGNOSIS — M545 Low back pain, unspecified: Secondary | ICD-10-CM | POA: Diagnosis not present

## 2020-07-21 DIAGNOSIS — R5383 Other fatigue: Secondary | ICD-10-CM | POA: Diagnosis not present

## 2020-07-21 DIAGNOSIS — J439 Emphysema, unspecified: Secondary | ICD-10-CM | POA: Diagnosis not present

## 2020-07-21 DIAGNOSIS — M1990 Unspecified osteoarthritis, unspecified site: Secondary | ICD-10-CM | POA: Diagnosis not present

## 2020-07-21 DIAGNOSIS — R0902 Hypoxemia: Secondary | ICD-10-CM | POA: Diagnosis not present

## 2020-07-21 DIAGNOSIS — Z7982 Long term (current) use of aspirin: Secondary | ICD-10-CM | POA: Diagnosis not present

## 2020-08-06 DIAGNOSIS — I70213 Atherosclerosis of native arteries of extremities with intermittent claudication, bilateral legs: Secondary | ICD-10-CM | POA: Diagnosis not present

## 2020-08-06 DIAGNOSIS — E538 Deficiency of other specified B group vitamins: Secondary | ICD-10-CM | POA: Diagnosis not present

## 2020-08-06 DIAGNOSIS — T466X5A Adverse effect of antihyperlipidemic and antiarteriosclerotic drugs, initial encounter: Secondary | ICD-10-CM | POA: Diagnosis not present

## 2020-08-06 DIAGNOSIS — G720 Drug-induced myopathy: Secondary | ICD-10-CM | POA: Diagnosis not present

## 2020-08-06 DIAGNOSIS — I700 Atherosclerosis of aorta: Secondary | ICD-10-CM | POA: Diagnosis not present

## 2020-08-26 ENCOUNTER — Emergency Department (HOSPITAL_COMMUNITY): Payer: Medicare Other

## 2020-08-26 ENCOUNTER — Encounter (HOSPITAL_COMMUNITY): Payer: Self-pay | Admitting: Emergency Medicine

## 2020-08-26 ENCOUNTER — Inpatient Hospital Stay (HOSPITAL_COMMUNITY)
Admission: EM | Admit: 2020-08-26 | Discharge: 2020-08-29 | DRG: 378 | Disposition: A | Discharge status: Home / Self Care | Payer: Medicare Other | Attending: Family Medicine | Admitting: Family Medicine

## 2020-08-26 ENCOUNTER — Other Ambulatory Visit: Payer: Self-pay

## 2020-08-26 DIAGNOSIS — I10 Essential (primary) hypertension: Secondary | ICD-10-CM | POA: Diagnosis not present

## 2020-08-26 DIAGNOSIS — K5731 Diverticulosis of large intestine without perforation or abscess with bleeding: Secondary | ICD-10-CM | POA: Diagnosis not present

## 2020-08-26 DIAGNOSIS — Z66 Do not resuscitate: Secondary | ICD-10-CM | POA: Diagnosis not present

## 2020-08-26 DIAGNOSIS — Z8616 Personal history of COVID-19: Secondary | ICD-10-CM | POA: Diagnosis not present

## 2020-08-26 DIAGNOSIS — Z87891 Personal history of nicotine dependence: Secondary | ICD-10-CM

## 2020-08-26 DIAGNOSIS — K635 Polyp of colon: Secondary | ICD-10-CM | POA: Diagnosis present

## 2020-08-26 DIAGNOSIS — J439 Emphysema, unspecified: Secondary | ICD-10-CM | POA: Diagnosis not present

## 2020-08-26 DIAGNOSIS — Z7982 Long term (current) use of aspirin: Secondary | ICD-10-CM

## 2020-08-26 DIAGNOSIS — K625 Hemorrhage of anus and rectum: Secondary | ICD-10-CM | POA: Diagnosis not present

## 2020-08-26 DIAGNOSIS — F419 Anxiety disorder, unspecified: Secondary | ICD-10-CM | POA: Diagnosis present

## 2020-08-26 DIAGNOSIS — K922 Gastrointestinal hemorrhage, unspecified: Secondary | ICD-10-CM | POA: Diagnosis not present

## 2020-08-26 DIAGNOSIS — J9611 Chronic respiratory failure with hypoxia: Secondary | ICD-10-CM | POA: Diagnosis not present

## 2020-08-26 DIAGNOSIS — K222 Esophageal obstruction: Secondary | ICD-10-CM | POA: Diagnosis present

## 2020-08-26 DIAGNOSIS — R58 Hemorrhage, not elsewhere classified: Secondary | ICD-10-CM | POA: Diagnosis not present

## 2020-08-26 DIAGNOSIS — K644 Residual hemorrhoidal skin tags: Secondary | ICD-10-CM | POA: Diagnosis present

## 2020-08-26 DIAGNOSIS — Z888 Allergy status to other drugs, medicaments and biological substances status: Secondary | ICD-10-CM

## 2020-08-26 DIAGNOSIS — Z9981 Dependence on supplemental oxygen: Secondary | ICD-10-CM

## 2020-08-26 DIAGNOSIS — Z880 Allergy status to penicillin: Secondary | ICD-10-CM

## 2020-08-26 DIAGNOSIS — H548 Legal blindness, as defined in USA: Secondary | ICD-10-CM | POA: Diagnosis present

## 2020-08-26 DIAGNOSIS — Z885 Allergy status to narcotic agent status: Secondary | ICD-10-CM

## 2020-08-26 DIAGNOSIS — Z79899 Other long term (current) drug therapy: Secondary | ICD-10-CM

## 2020-08-26 DIAGNOSIS — K219 Gastro-esophageal reflux disease without esophagitis: Secondary | ICD-10-CM | POA: Diagnosis present

## 2020-08-26 DIAGNOSIS — Z809 Family history of malignant neoplasm, unspecified: Secondary | ICD-10-CM

## 2020-08-26 DIAGNOSIS — Z8711 Personal history of peptic ulcer disease: Secondary | ICD-10-CM

## 2020-08-26 DIAGNOSIS — I739 Peripheral vascular disease, unspecified: Secondary | ICD-10-CM | POA: Diagnosis present

## 2020-08-26 DIAGNOSIS — Z7951 Long term (current) use of inhaled steroids: Secondary | ICD-10-CM

## 2020-08-26 DIAGNOSIS — K648 Other hemorrhoids: Secondary | ICD-10-CM | POA: Diagnosis present

## 2020-08-26 HISTORY — DX: COVID-19: U07.1

## 2020-08-26 LAB — CBC
HCT: 42.3 % (ref 36.0–46.0)
Hemoglobin: 13 g/dL (ref 12.0–15.0)
MCH: 31.5 pg (ref 26.0–34.0)
MCHC: 30.7 g/dL (ref 30.0–36.0)
MCV: 102.4 fL — ABNORMAL HIGH (ref 80.0–100.0)
Platelets: 201 10*3/uL (ref 150–400)
RBC: 4.13 MIL/uL (ref 3.87–5.11)
RDW: 13.2 % (ref 11.5–15.5)
WBC: 7.5 10*3/uL (ref 4.0–10.5)
nRBC: 0 % (ref 0.0–0.2)

## 2020-08-26 LAB — CBC WITH DIFFERENTIAL/PLATELET
Abs Immature Granulocytes: 0.02 10*3/uL (ref 0.00–0.07)
Basophils Absolute: 0 10*3/uL (ref 0.0–0.1)
Basophils Relative: 1 %
Eosinophils Absolute: 0 10*3/uL (ref 0.0–0.5)
Eosinophils Relative: 0 %
HCT: 42.7 % (ref 36.0–46.0)
Hemoglobin: 13.4 g/dL (ref 12.0–15.0)
Immature Granulocytes: 0 %
Lymphocytes Relative: 9 %
Lymphs Abs: 0.6 10*3/uL — ABNORMAL LOW (ref 0.7–4.0)
MCH: 32 pg (ref 26.0–34.0)
MCHC: 31.4 g/dL (ref 30.0–36.0)
MCV: 101.9 fL — ABNORMAL HIGH (ref 80.0–100.0)
Monocytes Absolute: 0.6 10*3/uL (ref 0.1–1.0)
Monocytes Relative: 9 %
Neutro Abs: 5.2 10*3/uL (ref 1.7–7.7)
Neutrophils Relative %: 81 %
Platelets: 185 10*3/uL (ref 150–400)
RBC: 4.19 MIL/uL (ref 3.87–5.11)
RDW: 13.2 % (ref 11.5–15.5)
WBC: 6.4 10*3/uL (ref 4.0–10.5)
nRBC: 0 % (ref 0.0–0.2)

## 2020-08-26 LAB — RETICULOCYTES
Immature Retic Fract: 20.1 % — ABNORMAL HIGH (ref 2.3–15.9)
RBC.: 4.04 MIL/uL (ref 3.87–5.11)
Retic Count, Absolute: 118.8 10*3/uL (ref 19.0–186.0)
Retic Ct Pct: 2.9 % (ref 0.4–3.1)

## 2020-08-26 LAB — COMPREHENSIVE METABOLIC PANEL
ALT: 16 U/L (ref 0–44)
AST: 28 U/L (ref 15–41)
Albumin: 4.3 g/dL (ref 3.5–5.0)
Alkaline Phosphatase: 79 U/L (ref 38–126)
Anion gap: 12 (ref 5–15)
BUN: 13 mg/dL (ref 8–23)
CO2: 26 mmol/L (ref 22–32)
Calcium: 9.8 mg/dL (ref 8.9–10.3)
Chloride: 98 mmol/L (ref 98–111)
Creatinine, Ser: 0.9 mg/dL (ref 0.44–1.00)
GFR, Estimated: >60 mL/min (ref >60)
Glucose, Bld: 98 mg/dL (ref 70–99)
Potassium: 4.6 mmol/L (ref 3.5–5.1)
Sodium: 136 mmol/L (ref 135–145)
Total Bilirubin: 0.9 mg/dL (ref 0.3–1.2)
Total Protein: 7.8 g/dL (ref 6.5–8.1)

## 2020-08-26 LAB — POC OCCULT BLOOD, ED: Fecal Occult Bld: POSITIVE — AB

## 2020-08-26 LAB — TYPE AND SCREEN
ABO/RH(D): O POS
Antibody Screen: NEGATIVE

## 2020-08-26 LAB — APTT: aPTT: 28 seconds (ref 24–36)

## 2020-08-26 LAB — PROTIME-INR
INR: 1 (ref 0.8–1.2)
Prothrombin Time: 12.6 seconds (ref 11.4–15.2)

## 2020-08-26 MED ORDER — SODIUM CHLORIDE 0.9 % IV SOLN: Freq: Once | INTRAVENOUS | Status: AC

## 2020-08-26 MED ORDER — BACLOFEN 10 MG PO TABS
10.0000 mg | ORAL_TABLET | Freq: Two times a day (BID) | ORAL | Status: DC | PRN
  Filled 2020-08-26: qty 1

## 2020-08-26 MED ORDER — PANTOPRAZOLE SODIUM 40 MG IV SOLR
40.0000 mg | Freq: Once | INTRAVENOUS | Status: AC
  Administered 2020-08-26: 40 mg via INTRAVENOUS
  Filled 2020-08-26: qty 40

## 2020-08-26 MED ORDER — PANTOPRAZOLE SODIUM 40 MG IV SOLR
40.0000 mg | Freq: Two times a day (BID) | INTRAVENOUS | Status: DC
  Administered 2020-08-27 – 2020-08-28 (×3): 40 mg via INTRAVENOUS
  Filled 2020-08-26 (×4): qty 40

## 2020-08-26 MED ORDER — ALPRAZOLAM 0.25 MG PO TABS: 0.2500 mg | ORAL_TABLET | ORAL | Status: DC

## 2020-08-26 MED ORDER — AMLODIPINE BESY-BENAZEPRIL HCL 5-10 MG PO CAPS: 1.0000 | ORAL_CAPSULE | Freq: Every day | ORAL | Status: DC

## 2020-08-26 MED ORDER — ARFORMOTEROL TARTRATE 15 MCG/2ML IN NEBU
15.0000 ug | INHALATION_SOLUTION | Freq: Two times a day (BID) | RESPIRATORY_TRACT | Status: DC
  Administered 2020-08-27 – 2020-08-28 (×3): 15 ug via RESPIRATORY_TRACT
  Filled 2020-08-26 (×6): qty 2

## 2020-08-26 MED ORDER — BENAZEPRIL HCL 5 MG PO TABS
10.0000 mg | ORAL_TABLET | Freq: Every day | ORAL | Status: DC
  Administered 2020-08-27 – 2020-08-28 (×3): 10 mg via ORAL
  Filled 2020-08-26 (×4): qty 2

## 2020-08-26 MED ORDER — ACETAMINOPHEN 325 MG PO TABS: 650.0000 mg | ORAL_TABLET | Freq: Four times a day (QID) | ORAL | Status: DC | PRN

## 2020-08-26 MED ORDER — ONDANSETRON HCL 4 MG/2ML IJ SOLN: 4.0000 mg | Freq: Four times a day (QID) | INTRAMUSCULAR | Status: DC | PRN

## 2020-08-26 MED ORDER — ALPRAZOLAM 0.5 MG PO TABS
0.5000 mg | ORAL_TABLET | Freq: Every day | ORAL | Status: DC
  Administered 2020-08-27 – 2020-08-28 (×3): 0.5 mg via ORAL
  Filled 2020-08-26: qty 1
  Filled 2020-08-26: qty 2
  Filled 2020-08-26: qty 1

## 2020-08-26 MED ORDER — ONDANSETRON HCL 4 MG PO TABS: 4.0000 mg | ORAL_TABLET | Freq: Four times a day (QID) | ORAL | Status: DC | PRN

## 2020-08-26 MED ORDER — AMLODIPINE BESYLATE 5 MG PO TABS
5.0000 mg | ORAL_TABLET | Freq: Every day | ORAL | Status: DC
  Administered 2020-08-27 – 2020-08-28 (×3): 5 mg via ORAL
  Filled 2020-08-26 (×3): qty 1

## 2020-08-26 MED ORDER — ALPRAZOLAM 0.25 MG PO TABS
0.2500 mg | ORAL_TABLET | Freq: Every day | ORAL | Status: DC
  Administered 2020-08-27 – 2020-08-28 (×2): 0.25 mg via ORAL
  Filled 2020-08-26 (×2): qty 1

## 2020-08-26 MED ORDER — ONDANSETRON HCL 4 MG/2ML IJ SOLN
4.0000 mg | Freq: Once | INTRAMUSCULAR | Status: AC
  Administered 2020-08-26: 4 mg via INTRAVENOUS
  Filled 2020-08-26: qty 2

## 2020-08-26 MED ORDER — UMECLIDINIUM BROMIDE 62.5 MCG/INH IN AEPB
1.0000 | INHALATION_SPRAY | Freq: Every day | RESPIRATORY_TRACT | Status: DC
  Administered 2020-08-27: 09:00:00 1 via RESPIRATORY_TRACT
  Filled 2020-08-26: qty 7

## 2020-08-26 MED ORDER — LACTATED RINGERS IV SOLN: INTRAVENOUS | Status: AC

## 2020-08-26 MED ORDER — ACETAMINOPHEN 650 MG RE SUPP: 650.0000 mg | Freq: Four times a day (QID) | RECTAL | Status: DC | PRN

## 2020-08-26 MED ORDER — IOHEXOL 300 MG/ML  SOLN
100.0000 mL | Freq: Once | INTRAMUSCULAR | Status: AC | PRN
  Administered 2020-08-26: 100 mL via INTRAVENOUS

## 2020-08-26 NOTE — H&P (Addendum)
History and Physical    Kelly Phillips [id number] DOB: 04/21/1946 DOA: 08/26/2020  PCP: Venetia Maxon [place], MD  Patient coming from: Home via EMS   Chief Complaint:  Chief Complaint  Patient presents with  . Rectal Bleeding     HPI:    With past medical history of COPD, chronic respiratory failure (on 3lpm via [place]), previous GI bleeding (07/2019, gastric ulcer S/P epi with clipping), gastroesophageal reflux disease and hypertension who presents to [facility] emergency department with complaints of bloody stools.  Patient explains that last Thursday she noticed that she began to have maroon-colored stools mixed with blood clots.  This occurred on at least 2 occasions on both Thursday and Friday.  On Saturday, the patient has continued to experience significant mount of blood on her toilet paper when she wipes after bowel movements.  Upon further questioning patient complains of associated generalized abdominal discomfort and vague malaise.  Patient denies chest pain shortness of breath or lightheadedness.  Patient does endorse occasional consumption of wine.  Patient does take daily Pletal and aspirin but denies any other anticoagulant use.    Patient presented to [facility] emergency department for evaluation.    Upon arrival to the emergency department waiting room, the patient experienced a very large episode of maroon-colored stool witnessed by medical staff.  Initial work-up by the emergency room provider revealed yield a normal hemoglobin of 13.4.  Stool Hemoccult was also found to be positive for discussed with Dr. Michail Sermon with gastroenterology who recommended overnight monitoring and formal GI consultation in the morning when Dr. Alessandra Bevels can then evaluate the patient (who happens to be the patient's GI provider anyway).  Prescription was then called to assess the patient for mission to the hospital.  Review of Systems:   Review of Systems  Constitutional:  Positive for malaise/fatigue.  Gastrointestinal: Positive for abdominal pain and blood in stool.  All other systems reviewed and are negative.   Past Medical History:  Diagnosis Date  . COPD (chronic obstructive pulmonary disease) ([place])   . COPD (chronic obstructive pulmonary disease) with emphysema ([place]) 05/02/2015  . COVID-19 virus infection   . PAD (peripheral artery disease) ([place])   . SMA stenosis   . Upper GI bleed 07/26/2019    Past Surgical History:  Procedure Laterality Date  . AORTO-FEMORAL BYPASS GRAFT    . BIOPSY  03/06/2020   Procedure: BIOPSY;  Surgeon: Otis Brace, MD;  Location: [facility] ENDOSCOPY;  Service: Gastroenterology;;  . ESOPHAGOGASTRODUODENOSCOPY (EGD) WITH PROPOFOL N/A 07/31/2019   Procedure: ESOPHAGOGASTRODUODENOSCOPY (EGD) WITH PROPOFOL;  Surgeon: Wonda Horner, MD;  Location[facility] ENDOSCOPY;  Service: Endoscopy;  Laterality: N/A;  . ESOPHAGOGASTRODUODENOSCOPY (EGD) WITH PROPOFOL N/A 03/06/2020   Procedure: ESOPHAGOGASTRODUODENOSCOPY (EGD) WITH PROPOFOL;  Surgeon: Otis Brace, MD;  Location: [facility] ENDOSCOPY;  Service: Gastroenterology;  Laterality: N/A;  . HEMOSTASIS CLIP PLACEMENT  07/31/2019   Procedure: HEMOSTASIS CLIP PLACEMENT;  Surgeon: Wonda Horner, MD;  Location: [facility] ENDOSCOPY;  Service: Endoscopy;;  . SCLEROTHERAPY  07/31/2019   Procedure: SCLEROTHERAPY;  Surgeon: Wonda Horner, MD;  Location: [facility] ENDOSCOPY;  Service: Endoscopy;;     reports that she quit smoking about 5 years ago. She has never used smokeless tobacco. She reports that she does not use drugs. No history on file for alcohol use.  Allergies  Allergen Reactions  . Codeine Swelling    Facial swelling  . Salmon [Fish Allergy] Anaphylaxis and Swelling  . Penicillins Nausea And Vomiting  . Aspirin  Tinitus    Family History  Problem Relation Age of Onset  . Cancer Father      Prior to Admission medications   Medication Sig Start Date End Date Taking? Authorizing Provider   ALPRAZolam Duanne Moron) 0.5 MG tablet Take 0.25-0.5 mg by mouth See admin instructions. Take 1/2 tablet (0.25 mg) by mouth every morning and 1 tablet (0.5 mg) at night 01/25/20  Yes [provider]  amLODipine-benazepril (LOTREL) 5-10 MG capsule Take 1 capsule by mouth at bedtime. 09/01/19  Yes [provider]  aspirin EC 81 MG tablet Take 81 mg by mouth at bedtime. Swallow whole.   Yes [provider]  baclofen (LIORESAL) 20 MG tablet Take 10 mg by mouth 2 (two) times daily as needed for muscle spasms. 08/01/20  Yes [provider]  Cholecalciferol (VITAMIN D-3) 125 MCG (5000 UT) TABS Take 5,000 Units by mouth at bedtime.   Yes [provider]  cyanocobalamin (,VITAMIN B-12,) 1000 MCG/ML injection Inject 1,000 mcg into the muscle every 30 (thirty) days.   Yes [provider]  docusate sodium (COLACE) 100 MG capsule Take 100-200 mg by mouth See admin instructions. Take one capsule (100 mg) by mouth every other night at bedtime, take two capsules (200 mg) on alternate nights   Yes [provider]  fluticasone (FLOVENT HFA) 110 MCG/ACT inhaler Inhale 2 puffs into the lungs daily.   Yes [provider]  gabapentin (NEURONTIN) 300 MG capsule Take 300-600 mg by mouth See admin instructions. Take one capsule (300 mg) by mouth after lunch and two capsules (600 mg) at night 11/28/15  Yes [provider]  omeprazole (PRILOSEC) 40 MG capsule Take 1 capsule (40 mg total) by mouth daily before breakfast. Patient taking differently: Take 40 mg by mouth daily after lunch. 03/06/20 06/04/20 Yes Brahmbhatt, Parag, MD  OXYGEN Inhale 3 L into the lungs continuous.   Yes [provider]  polyethylene glycol (MIRALAX / GLYCOLAX) 17 g packet Take 17 g by mouth daily as needed for mild constipation or moderate constipation. 08/09/19  Yes Mariel Aloe, MD  Tiotropium Bromide-Olodaterol (STIOLTO RESPIMAT) 2.5-2.5 MCG/ACT AERS Inhale 2 puffs into  the lungs daily.   Yes [provider]  cilostazol (PLETAL) 100 MG tablet Take 100 mg by mouth 2 (two) times daily. 08/06/20   [provider]    Physical Exam: Vitals:   08/26/20 1233 08/26/20 1438 08/26/20 1750 08/26/20 2015  BP: (!) 155/105 (!) 154/92 (!) 138/91 126/87  Pulse: (!) 113 (!) 106 (!) 103 96  Resp: 16 16 18 17   Temp: 98.1 F (36.7 C) 98.1 F (36.7 C)    TempSrc: Oral Oral    SpO2: 100% 100% 100% 100%    Constitutional: Acute alert and oriented x3, no associated distress.   Skin: no rashes, no lesions, good skin turgor noted. Eyes: Pupils are equally reactive to light.  No evidence of scleral icterus or conjunctival pallor.  ENMT: Moist mucous membranes noted.  Posterior pharynx clear of any exudate or lesions.   Neck: normal, supple, no masses, no thyromegaly.  No evidence of jugular venous distension.   Respiratory: clear to auscultation bilaterally, no wheezing, no crackles. Normal respiratory effort. No accessory muscle use.  Cardiovascular: Regular rate and rhythm, no murmurs / rubs / gallops. No extremity edema. 2+ pedal pulses. No carotid bruits.  Chest:   Nontender without crepitus or deformity.   Back:   Nontender without crepitus or deformity. Abdomen: Right lower quadrant tenderness.  Abdomen is soft.  No evidence of intra-abdominal masses.  Positive bowel sounds noted in all quadrants.   Musculoskeletal: No joint deformity upper and lower extremities. Good ROM, no contractures. Normal muscle tone.  Neurologic: CN 2-12 grossly intact. Sensation intact.  Patient moving all 4 extremities spontaneously.  Patient is following all commands.  Patient is responsive to verbal stimuli.   Psychiatric: Patient exhibits normal mood with appropriate affect.  Patient seems to possess insight as to their current situation.     Labs on Admission: I have personally reviewed following labs and imaging studies -   CBC: Recent Labs  Lab 08/26/20 1749  WBC  6.4  NEUTROABS 5.2  HGB 13.4  HCT 42.7  MCV 101.9*  PLT 123XX123   Basic Metabolic Panel: Recent Labs  Lab 08/26/20 1235  NA 136  K 4.6  CL 98  CO2 26  GLUCOSE 98  BUN 13  CREATININE 0.90  CALCIUM 9.8   GFR: CrCl cannot be calculated (Unknown ideal weight.). Liver Function Tests: Recent Labs  Lab 08/26/20 1235  AST 28  ALT 16  ALKPHOS 79  BILITOT 0.9  PROT 7.8  ALBUMIN 4.3   No results for input(s): LIPASE, AMYLASE in the last 168 hours. No results for input(s): AMMONIA in the last 168 hours. Coagulation Profile: No results for input(s): INR, PROTIME in the last 168 hours. Cardiac Enzymes: No results for input(s): CKTOTAL, CKMB, CKMBINDEX, TROPONINI in the last 168 hours. BNP (last 3 results) No results for input(s): PROBNP in the last 8760 hours. HbA1C: No results for input(s): HGBA1C in the last 72 hours. CBG: No results for input(s): GLUCAP in the last 168 hours. Lipid Profile: No results for input(s): CHOL, HDL, LDLCALC, TRIG, CHOLHDL, LDLDIRECT in the last 72 hours. Thyroid Function Tests: No results for input(s): TSH, T4TOTAL, FREET4, T3FREE, THYROIDAB in the last 72 hours. Anemia Panel: No results for input(s): VITAMINB12, FOLATE, FERRITIN, TIBC, IRON, RETICCTPCT in the last 72 hours. Urine analysis:    Component Value Date/Time   COLORURINE YELLOW 05/23/2015 [place] 05/23/2015 1521   LABSPEC 1.006 05/23/2015 1521   PHURINE 8.5 (H) 05/23/2015 1521   GLUCOSEU NEGATIVE 05/23/2015 1521   HGBUR TRACE (A) 05/23/2015 1521   BILIRUBINUR NEGATIVE 05/23/2015 1521   KETONESUR NEGATIVE 05/23/2015 1521   PROTEINUR NEGATIVE 05/23/2015 1521   UROBILINOGEN 0.2 05/23/2015 1521   NITRITE NEGATIVE 05/23/2015 1521   LEUKOCYTESUR LARGE (A) 05/23/2015 1521    Radiological Exams on Admission - Personally Reviewed: CT ABDOMEN PELVIS W CONTRAST  Result Date: 08/26/2020 CLINICAL DATA:  Bloody stool EXAM: CT ABDOMEN AND PELVIS WITH CONTRAST TECHNIQUE:  Multidetector CT imaging of the abdomen and pelvis was performed using the standard protocol following bolus administration of intravenous contrast. CONTRAST:  173mL OMNIPAQUE IOHEXOL 300 MG/ML  SOLN COMPARISON:  CT 07/21/2020, 07/26/2019 FINDINGS: Lower chest: Lung bases demonstrate emphysema. Atelectasis or scar at the lingula. No acute consolidation or effusion. Hepatobiliary: No calcified gallstone. No focal hepatic abnormality. No biliary dilatation. Pancreas: Unremarkable. No pancreatic ductal dilatation or surrounding inflammatory changes. Spleen: Normal in size without focal abnormality. Adrenals/Urinary Tract: Adrenal glands are normal. Kidneys show no hydronephrosis. Scarring at the lower pole of right kidney. The bladder is unremarkable Stomach/Bowel: The stomach is nonenlarged. No dilated small bowel. Extensive sigmoid colon diverticular disease without acute inflammatory change. Vascular/Lymphatic: Advanced aortic atherosclerosis. Stent within the proximal superior mesenteric artery. Patient is status post aortobifemoral bypass with patency of the bypass. Calcified and chronically occluded  native aorta and iliac vessels. No suspicious adenopathy Reproductive: Status post hysterectomy. No adnexal masses. Other: Negative for free air or free fluid. Diastasis of the rectus with anterior bulging of mesenteric fat and colon. Musculoskeletal: No acute or suspicious osseous abnormality. Mild chronic superior endplate deformity at T12. IMPRESSION: 1. No CT evidence for acute intra-abdominal or pelvic abnormality. 2. Extensive sigmoid colon diverticular disease without acute inflammatory change. 3. Status post aortobifemoral bypass with patency. 4. Emphysema and aortic atherosclerosis. Aortic Atherosclerosis (ICD10-I70.0) and Emphysema (ICD10-J43.9). Electronically Signed   By: Jasmine Pang M.D.   On: 08/26/2020 18:56    EKG: Personally reviewed.  Rhythm is sinus rhythm with heart rate of 98 bpm.  No dynamic  ST segment changes appreciated.  Assessment/Plan Principal Problem:   Acute lower GI bleeding   Patient presenting with 3-day history of either maroon-colored stool or blood on the toilet paper [place] for lower GI bleeding  CT imaging of the abdomen and pelvis reveals extensive diverticulosis without evidence of diverticulitis making possible diverticular bleed likely  Patient denies heavy alcohol use but does endorse daily use of aspirin and Pletal.  Considering patient's history of upper gastrointestinal bleeding in the past we will place patient on Protonix 40 mg IV every 12 for now although this is more likely to be a slow lower gastrointestinal bleed.  ER provider has already discussed the case with Dr. Bosie Clos with gastroenterology.  Despite the patient being hemodynamically stable with a normal hemoglobin and has been recommended to hospitalize the patient overnight for observation and formally consult GI in the morning when Dr. Levora Angel can evaluate the patient for possible endoscopic evaluation  We will place patient on clear liquid diet, make patient n.p.o. after midnight.  Performing serial CBCs  Monitoring closely for further bleeding  Active Problems:   Benign essential hypertension   Continue home regimen of antihypertensive therapy    COPD (chronic obstructive pulmonary disease) with emphysema ([facility])   No evidence of COPD exacerbation at this time  As needed bronchodilator therapy for shortness of breath and wheezing  Continue home regimen of maintenance inhalers    Chronic respiratory failure with hypoxia ([facility])   Longstanding history of chronic oxygen requirement.  While last pulmonology note in August 2021 states patient is on 3 L of oxygen via nasal cannula with sleep and exertion, patient states that she has on 3 L chronically at all times.    GERD without esophagitis  Patient already receiving PPI for suspected gastrointestinal bleeding as noted  above.   Code Status:  DNR Family Communication: deferred   Status is: Observation  The patient remains OBS appropriate and will d/c before 2 midnights.  Dispo: The patient is from: Home              Anticipated d/c is to: Home              Anticipated d/c date is: 2 days              Patient currently is not medically stable to d/c.        Marinda Elk MD [facility] Pager [phone]  If 7PM-7AM, please contact night-coverage [email or web address] Use universal [place] password for that web site. If you do not have the password, please call the hospital operator.  08/26/2020, 10:49 PM

## 2020-08-26 NOTE — ED Notes (Signed)
Patient transported to CT 

## 2020-08-26 NOTE — ED Triage Notes (Addendum)
Pt to triage via [facility] from home.  C/o rectal bleeding.  Had 3 episodes of bleeding Thursday and Friday that stopped.  For the last 2 days she has blood on tissue after having BM.  Generalized abd discomfort.   Wears 3 liters [place] at home.

## 2020-08-26 NOTE — ED Provider Notes (Signed)
[facility] EMERGENCY DEPARTMENT Provider Note   CSN: [id number] Arrival date & time: 08/26/20  1139     History Chief Complaint  Patient presents with  . Rectal Bleeding    Kelly Phillips is a 75 y.o. female with history of vitamin B12 deficiency anemia, COPD on chronic 3 L [place], peripheral artery disease s/p aortobifemoral bypass grafting, hypertension, SMA stenosis s/p stenting, CAS, legally blind, gastric ulcer presents to the ED for evaluation of blood in her stools.  Reports passing black blood clots twice on Thursday and twice on Friday.  Every bowel movement since has been bloody and dark red, loose.  States the day before symptoms started she had taken MiraLAX for constipation.  Has associated nausea, decreased appetite, lower abdominal discomfort 2/10, bloating.  History of chronic back pain and reports her lower back pain is also "killing her".  Reports in 2020 having a bleeding ulcer and equal GI "repaired".  Had a repeat EGD in July and told her that everything looked okay.  Does not take anything for ulcers.  On the baby aspirin.  Drinks a glass of wine nightly.  Denies history of gastrointestinal bleed, diverticulitis.  Does not take ibuprofen products.  Denies fever, vomiting.  No dysuria.  Chronic shortness of breath due to COPD but managed with inhalers and feels like this is unchanged.  No chest pain, palpitations, syncope.  Interventions.  No modifying factors. HPI       Past Medical History:  Diagnosis Date  . COPD (chronic obstructive pulmonary disease) (HCC)   . PAD (peripheral artery disease) (HCC)   . SMA stenosis     Patient Active Problem List   Diagnosis Date Noted  . Heartburn 10/28/2019  . Shortness of breath 10/28/2019  . Status post aortobifemoral bypass surgery 09/09/2019  . Tachycardia 09/09/2019  . COPD (chronic obstructive pulmonary disease) (HCC)   . COVID-19 virus infection   . Acute blood loss anemia   . Thrombocytopenia (HCC)    . Upper GI bleed 07/26/2019  . Exercise hypoxemia 07/10/2016  . Moderate COPD (chronic obstructive pulmonary disease) (HCC) 07/10/2016  . Benign essential hypertension 01/17/2016  . Bilateral carotid artery stenosis 01/17/2016  . Former smoker 01/17/2016  . Superior mesenteric artery stenosis (HCC) 01/17/2016  . Multiple lung nodules on CT 12/28/2015  . COPD, severe (HCC) 12/28/2015  . Atherosclerosis of native artery of extremity with intermittent claudication (HCC) 11/22/2015  . Chronic mesenteric insufficiency (HCC) 11/22/2015  . Edema of extremities 11/22/2015  . Ventilator dependence, reintubated 9/7 for return to OR for thrombectomy, Massive volume overload, may need trach 05/11/2015  . History of smoking 05/11/2015  . Peripheral vascular disease, unspecified (HCC) 05/11/2015  . Arthritis 05/11/2015  . Macular degeneration 05/11/2015  . Aortic occlusion (HCC) 05/02/2015  . COPD (chronic obstructive pulmonary disease) with emphysema (HCC) 05/02/2015  . Aortic thromboembolism (HCC) 04/04/2015    Past Surgical History:  Procedure Laterality Date  . AORTO-FEMORAL BYPASS GRAFT    . BIOPSY  03/06/2020   Procedure: BIOPSY;  Surgeon: Kathi Der, MD;  Location: [facility] ENDOSCOPY;  Service: Gastroenterology;;  . ESOPHAGOGASTRODUODENOSCOPY (EGD) WITH PROPOFOL N/A 07/31/2019   Procedure: ESOPHAGOGASTRODUODENOSCOPY (EGD) WITH PROPOFOL;  Surgeon: Graylin Shiver, MD;  Location: [facility] ENDOSCOPY;  Service: Endoscopy;  Laterality: N/A;  . ESOPHAGOGASTRODUODENOSCOPY (EGD) WITH PROPOFOL N/A 03/06/2020   Procedure: ESOPHAGOGASTRODUODENOSCOPY (EGD) WITH PROPOFOL;  Surgeon: Kathi Der, MD;  Location: [facility] ENDOSCOPY;  Service: Gastroenterology;  Laterality: N/A;  . HEMOSTASIS CLIP PLACEMENT  07/31/2019  Procedure: HEMOSTASIS CLIP PLACEMENT;  Surgeon: Wonda Horner, MD;  Location: [facility] ENDOSCOPY;  Service: Endoscopy;;  . SCLEROTHERAPY  07/31/2019   Procedure: Clide Deutscher;  Surgeon: Wonda Horner,  MD;  Location: [facility] ENDOSCOPY;  Service: Endoscopy;;     OB History   No obstetric history on file.     Family History  Problem Relation Age of Onset  . Cancer Father     Social History   Tobacco Use  . Smoking status: Former Smoker    Quit date: 05/02/2015    Years since quitting: 5.3  . Smokeless tobacco: Never Used  Vaping Use  . Vaping Use: Never used  Substance Use Topics  . Drug use: Never    Home Medications Prior to Admission medications   Medication Sig Start Date End Date Taking? Authorizing Provider  ALPRAZolam Duanne Moron) 0.5 MG tablet Take 0.25-0.5 mg by mouth See admin instructions. 0.25 mg in the morning, and 0.5 mg at bedtime 01/25/20   [provider]  amLODipine-benazepril (LOTREL) 5-10 MG capsule Take 1 capsule by mouth daily. 09/01/19   [provider]  docusate sodium (COLACE) 100 MG capsule Take 100 mg by mouth every other day.    [provider]  fluticasone (FLOVENT HFA) 110 MCG/ACT inhaler Inhale 2 puffs into the lungs in the morning and at bedtime.    [provider]  gabapentin (NEURONTIN) 300 MG capsule Take 900 mg by mouth at bedtime.  11/28/15   [provider]  omeprazole (PRILOSEC) 40 MG capsule Take 1 capsule (40 mg total) by mouth daily before breakfast. 03/06/20 06/04/20  Brahmbhatt, Orson Gear, MD  OXYGEN Inhale 3 L into the lungs continuous.    [provider]  polyethylene glycol (MIRALAX / GLYCOLAX) 17 g packet Take 17 g by mouth daily as needed for mild constipation or moderate constipation. 08/09/19   Mariel Aloe, MD    Allergies    Codeine, Hyman Hopes allergy], Penicillins, and Aspirin  Review of Systems   Review of Systems  Gastrointestinal: Positive for abdominal pain (bloating), blood in stool and nausea.  All other systems reviewed and are negative.   Physical Exam Updated Vital Signs BP 126/87   Pulse 96   Temp 98.1 F (36.7 C) (Oral)   Resp 17   LMP  (LMP Unknown)   SpO2  100%   Physical Exam Vitals and nursing note reviewed.  Constitutional:      Appearance: She is well-developed.     Comments: Non toxic in NAD  HENT:     Head: Normocephalic and atraumatic.     Nose: Nose normal.  Eyes:     Conjunctiva/sclera: Conjunctivae normal.  Cardiovascular:     Rate and Rhythm: Normal rate and regular rhythm.  Pulmonary:     Effort: Pulmonary effort is normal.     Breath sounds: Normal breath sounds.  Abdominal:     General: Bowel sounds are normal.     Palpations: Abdomen is soft.     Tenderness: There is abdominal tenderness (diffuse lower R>L).     Comments: No G/R/R. No suprapubic or CVA tenderness. Negative Murphy's and McBurney's. Active BS to lower quadrants.   Genitourinary:    Rectum: Guaiac result positive.     Comments: Maroon colored stool. Small hemorrhoid non tender without bleeding Musculoskeletal:        General: Normal range of motion.     Cervical back: Normal range of motion.  Skin:    General: Skin is warm  and dry.     Capillary Refill: Capillary refill takes less than 2 seconds.  Neurological:     Mental Status: She is alert.  Psychiatric:        Behavior: Behavior normal.     ED Results / Procedures / Treatments   Labs (all labs ordered are listed, but only abnormal results are displayed) Labs Reviewed  CBC WITH DIFFERENTIAL/PLATELET - Abnormal; Notable for the following components:      Result Value   MCV 101.9 (*)    Lymphs Abs 0.6 (*)    All other components within normal limits  POC OCCULT BLOOD, ED - Abnormal; Notable for the following components:   Fecal Occult Bld POSITIVE (*)    All other components within normal limits  COMPREHENSIVE METABOLIC PANEL  TYPE AND SCREEN    EKG None  Radiology CT ABDOMEN PELVIS W CONTRAST  Result Date: 08/26/2020 CLINICAL DATA:  Bloody stool EXAM: CT ABDOMEN AND PELVIS WITH CONTRAST TECHNIQUE: Multidetector CT imaging of the abdomen and pelvis was performed using the standard  protocol following bolus administration of intravenous contrast. CONTRAST:  OMNIPAQUE IOHEXOL 300 MG/ML  SOLN COMPARISON:  CT 07/21/2020, 07/26/2019 FINDINGS: Lower chest: Lung bases demonstrate emphysema. Atelectasis or scar at the lingula. No acute consolidation or effusion. Hepatobiliary: No calcified gallstone. No focal hepatic abnormality. No biliary dilatation. Pancreas: Unremarkable. No pancreatic ductal dilatation or surrounding inflammatory changes. Spleen: Normal in size without focal abnormality. Adrenals/Urinary Tract: Adrenal glands are normal. Kidneys show no hydronephrosis. Scarring at the lower pole of right kidney. The bladder is unremarkable Stomach/Bowel: The stomach is nonenlarged. No dilated small bowel. Extensive sigmoid colon diverticular disease without acute inflammatory change. Vascular/Lymphatic: Advanced aortic atherosclerosis. Stent within the proximal superior mesenteric artery. Patient is status post aortobifemoral bypass with patency of the bypass. Calcified and chronically occluded native aorta and iliac vessels. No suspicious adenopathy Reproductive: Status post hysterectomy. No adnexal masses. Other: Negative for free air or free fluid. Diastasis of the rectus with anterior bulging of mesenteric fat and colon. Musculoskeletal: No acute or suspicious osseous abnormality. Mild chronic superior endplate deformity at T12. IMPRESSION: 1. No CT evidence for acute intra-abdominal or pelvic abnormality. 2. Extensive sigmoid colon diverticular disease without acute inflammatory change. 3. Status post aortobifemoral bypass with patency. 4. Emphysema and aortic atherosclerosis. Aortic Atherosclerosis (ICD10-I70.0) and Emphysema (ICD10-J43.9). Electronically Signed   By: Jasmine Pang M.D.   On: 08/26/2020 18:56    Procedures Procedures (including critical care time)  Medications Ordered in ED Medications  ondansetron (ZOFRAN) injection 4 mg (4 mg Intravenous Given 08/26/20 1811)   0.9 %  sodium chloride infusion ( Intravenous New Bag/Given 08/26/20 1809)  iohexol (OMNIPAQUE) 300 MG/ML solution 100 mL (100 mLs Intravenous Contrast Given 08/26/20 1842)  pantoprazole (PROTONIX) injection 40 mg (40 mg Intravenous Given 08/26/20 2047)    ED Course  I have reviewed the triage vital signs and the nursing notes.  Pertinent labs & imaging results that were available during my care of the patient were reviewed by me and considered in my medical decision making (see chart for details).  Clinical Course as of 08/26/20 2052  Wynelle Link Aug 26, 2020  1716 EGD 02/2020 Impression:                - Z-line irregular, 35 cm from the incisors. Biopsied. - Non-obstructing Schatzki ring. - Gastritis. Biopsied. - Gastric stenosis was found at the pylorus. - Normal duodenal bulb, first portion of the  duodenum and second  portion of the duodenum. [CG]  1921 CT ABDOMEN PELVIS W CONTRAST IMPRESSION: 1. No CT evidence for acute intra-abdominal or pelvic abnormality. 2. Extensive sigmoid colon diverticular disease without acute inflammatory change. 3. Status post aortobifemoral bypass with patency. 4. Emphysema and aortic atherosclerosis.  Aortic Atherosclerosis (ICD10-I70.0) and Emphysema (ICD10-J43.9). [CG]  1921 Hemoglobin: 13.4 [CG]  2027 Fecal Occult Blood, POC(!): POSITIVE [CG]  2027 Pulse Rate(!): 113 [CG]  2040 Spoke to [facility]. Recommends observation with GI consult in AM. Dr Luan Pulling is hospital GI physician tomorrow [CG]    Clinical Course User Index [CG] Arlean Hopping   MDM Rules/Calculators/A&P                           75 y.o. yo with chief complaint of maroon colored BM for the last 4 days. Having 2-4 BM daily. Two prior to arrival to ER, one in ER. +Lower abdominal bloating and discomfort. Tachycardic 100-115 in ER.   Previous medical records available, triage and nursing notes reviewed to obtain more history and assist with MDM  H/o ulcer. Followed by [facility]. Recent EGD showed ring, gastritis but no ulcers.  Chief complain involves an extensive number of treatment options and is a complaint that carries with it a high risk of complications and morbidity and mortality.    Differential diagnosis: diverticulitis vs diverticular bleed. Less likely upper GI bleed given stool color, recent EGD.  ER lab work and imaging ordered by triage RN and me, as above  I have personally visualized and interpreted ER diagnostic work up including labs and imaging.    Labs reveal - Normal hemoglobin, WBC. +Hemoccult with maroon stool. Large maroon BM in ER.   Imaging reveals - Extensive diverticular disease, no diverticulitis or acute findigns.  Medications ordered - zofran, protonix, NS infusion   Ordered continuous cardiac and pulse ox monitoring.  Will plan for serial re-examinations. Close monitoring.   2030:Re-evaluated the patient.   Feels fine. No decline. Repeat abd exam unchanged, mild lower tenderness.   Overall, clinical presentation and ER work up most consistent with diverticular bleed.   Consults made in the ED: [facility] Dr Michail Sermon. Agrees patient seems stable. Protonix, clear liquid diet. Recommends observation, repeat H/H. Concern that [facility] has no availability and won't be able to follow up as OP in timely manner. Patients gastroenterologist is hospital GI tomorrow, can see in AM.   Final Clinical Impression(s) / ED Diagnoses Final diagnoses:  Rectal bleeding    Rx / DC Orders ED Discharge Orders    None       Arlean Hopping 08/26/20 2052    Lajean Saver, MD 08/28/20 1137

## 2020-08-27 DIAGNOSIS — K5731 Diverticulosis of large intestine without perforation or abscess with bleeding: Secondary | ICD-10-CM | POA: Diagnosis not present

## 2020-08-27 DIAGNOSIS — Z7982 Long term (current) use of aspirin: Secondary | ICD-10-CM | POA: Diagnosis not present

## 2020-08-27 DIAGNOSIS — Z880 Allergy status to penicillin: Secondary | ICD-10-CM | POA: Diagnosis not present

## 2020-08-27 DIAGNOSIS — Z7401 Bed confinement status: Secondary | ICD-10-CM | POA: Diagnosis not present

## 2020-08-27 DIAGNOSIS — K922 Gastrointestinal hemorrhage, unspecified: Secondary | ICD-10-CM | POA: Diagnosis present

## 2020-08-27 DIAGNOSIS — Z79899 Other long term (current) drug therapy: Secondary | ICD-10-CM | POA: Diagnosis not present

## 2020-08-27 DIAGNOSIS — I739 Peripheral vascular disease, unspecified: Secondary | ICD-10-CM | POA: Diagnosis not present

## 2020-08-27 DIAGNOSIS — R58 Hemorrhage, not elsewhere classified: Secondary | ICD-10-CM | POA: Diagnosis not present

## 2020-08-27 DIAGNOSIS — H548 Legal blindness, as defined in USA: Secondary | ICD-10-CM | POA: Diagnosis not present

## 2020-08-27 DIAGNOSIS — K5730 Diverticulosis of large intestine without perforation or abscess without bleeding: Secondary | ICD-10-CM | POA: Diagnosis not present

## 2020-08-27 DIAGNOSIS — D125 Benign neoplasm of sigmoid colon: Secondary | ICD-10-CM | POA: Diagnosis not present

## 2020-08-27 DIAGNOSIS — Z9981 Dependence on supplemental oxygen: Secondary | ICD-10-CM | POA: Diagnosis not present

## 2020-08-27 DIAGNOSIS — J9611 Chronic respiratory failure with hypoxia: Secondary | ICD-10-CM | POA: Diagnosis not present

## 2020-08-27 DIAGNOSIS — Z888 Allergy status to other drugs, medicaments and biological substances status: Secondary | ICD-10-CM | POA: Diagnosis not present

## 2020-08-27 DIAGNOSIS — K644 Residual hemorrhoidal skin tags: Secondary | ICD-10-CM | POA: Diagnosis present

## 2020-08-27 DIAGNOSIS — Z809 Family history of malignant neoplasm, unspecified: Secondary | ICD-10-CM | POA: Diagnosis not present

## 2020-08-27 DIAGNOSIS — D62 Acute posthemorrhagic anemia: Secondary | ICD-10-CM | POA: Diagnosis not present

## 2020-08-27 DIAGNOSIS — Z8616 Personal history of COVID-19: Secondary | ICD-10-CM | POA: Diagnosis not present

## 2020-08-27 DIAGNOSIS — J439 Emphysema, unspecified: Secondary | ICD-10-CM | POA: Diagnosis not present

## 2020-08-27 DIAGNOSIS — Z66 Do not resuscitate: Secondary | ICD-10-CM | POA: Diagnosis not present

## 2020-08-27 DIAGNOSIS — F419 Anxiety disorder, unspecified: Secondary | ICD-10-CM | POA: Diagnosis not present

## 2020-08-27 DIAGNOSIS — Z7951 Long term (current) use of inhaled steroids: Secondary | ICD-10-CM | POA: Diagnosis not present

## 2020-08-27 DIAGNOSIS — K648 Other hemorrhoids: Secondary | ICD-10-CM | POA: Diagnosis not present

## 2020-08-27 DIAGNOSIS — Z8711 Personal history of peptic ulcer disease: Secondary | ICD-10-CM | POA: Diagnosis not present

## 2020-08-27 DIAGNOSIS — K635 Polyp of colon: Secondary | ICD-10-CM | POA: Diagnosis present

## 2020-08-27 DIAGNOSIS — K219 Gastro-esophageal reflux disease without esophagitis: Secondary | ICD-10-CM | POA: Diagnosis present

## 2020-08-27 DIAGNOSIS — Z87891 Personal history of nicotine dependence: Secondary | ICD-10-CM | POA: Diagnosis not present

## 2020-08-27 DIAGNOSIS — M2550 Pain in unspecified joint: Secondary | ICD-10-CM | POA: Diagnosis not present

## 2020-08-27 DIAGNOSIS — I10 Essential (primary) hypertension: Secondary | ICD-10-CM | POA: Diagnosis not present

## 2020-08-27 DIAGNOSIS — Z885 Allergy status to narcotic agent status: Secondary | ICD-10-CM | POA: Diagnosis not present

## 2020-08-27 DIAGNOSIS — K222 Esophageal obstruction: Secondary | ICD-10-CM | POA: Diagnosis not present

## 2020-08-27 DIAGNOSIS — K625 Hemorrhage of anus and rectum: Secondary | ICD-10-CM | POA: Diagnosis not present

## 2020-08-27 LAB — COMPREHENSIVE METABOLIC PANEL
ALT: 14 U/L (ref 0–44)
AST: 22 U/L (ref 15–41)
Albumin: 3.5 g/dL (ref 3.5–5.0)
Alkaline Phosphatase: 60 U/L (ref 38–126)
Anion gap: 8 (ref 5–15)
BUN: 10 mg/dL (ref 8–23)
CO2: 24 mmol/L (ref 22–32)
Calcium: 8.9 mg/dL (ref 8.9–10.3)
Chloride: 104 mmol/L (ref 98–111)
Creatinine, Ser: 0.89 mg/dL (ref 0.44–1.00)
GFR, Estimated: >60 mL/min (ref >60)
Glucose, Bld: 68 mg/dL — ABNORMAL LOW (ref 70–99)
Potassium: 4.1 mmol/L (ref 3.5–5.1)
Sodium: 136 mmol/L (ref 135–145)
Total Bilirubin: 1.3 mg/dL — ABNORMAL HIGH (ref 0.3–1.2)
Total Protein: 6.3 g/dL — ABNORMAL LOW (ref 6.5–8.1)

## 2020-08-27 LAB — CBC
HCT: 37 % (ref 36.0–46.0)
HCT: 32.4 % — ABNORMAL LOW (ref 36.0–46.0)
Hemoglobin: 11.4 g/dL — ABNORMAL LOW (ref 12.0–15.0)
Hemoglobin: 10.3 g/dL — ABNORMAL LOW (ref 12.0–15.0)
MCH: 31.4 pg (ref 26.0–34.0)
MCH: 32.3 pg (ref 26.0–34.0)
MCHC: 30.8 g/dL (ref 30.0–36.0)
MCHC: 31.8 g/dL (ref 30.0–36.0)
MCV: 101.9 fL — ABNORMAL HIGH (ref 80.0–100.0)
MCV: 101.6 fL — ABNORMAL HIGH (ref 80.0–100.0)
Platelets: 184 10*3/uL (ref 150–400)
Platelets: 166 10*3/uL (ref 150–400)
RBC: 3.63 MIL/uL — ABNORMAL LOW (ref 3.87–5.11)
RBC: 3.19 MIL/uL — ABNORMAL LOW (ref 3.87–5.11)
RDW: 13.2 % (ref 11.5–15.5)
RDW: 13.1 % (ref 11.5–15.5)
WBC: 6.8 10*3/uL (ref 4.0–10.5)
WBC: 5.9 10*3/uL (ref 4.0–10.5)
nRBC: 0 % (ref 0.0–0.2)
nRBC: 0 % (ref 0.0–0.2)

## 2020-08-27 LAB — MAGNESIUM: Magnesium: 1.7 mg/dL (ref 1.7–2.4)

## 2020-08-27 LAB — RESP PANEL BY RT-PCR (FLU A&B, COVID) ARPGX2
Influenza A by PCR: NEGATIVE
Influenza B by PCR: NEGATIVE
SARS Coronavirus 2 by RT PCR: NEGATIVE

## 2020-08-27 MED ORDER — SODIUM CHLORIDE 0.9 % IV SOLN: INTRAVENOUS | Status: DC

## 2020-08-27 MED ORDER — PEG 3350-KCL-NA BICARB-NACL 420 G PO SOLR
4000.0000 mL | Freq: Once | ORAL | Status: AC
  Administered 2020-08-27: 4000 mL via ORAL
  Filled 2020-08-27 (×2): qty 4000

## 2020-08-27 NOTE — ED Notes (Signed)
Dr. Danford at bedside  

## 2020-08-27 NOTE — Progress Notes (Signed)
RT at bedside to give breathing treatment. Pt refused breathing treatment stating she wants her inhaler. Pt's inhaler has not yet arrived, pharmacy was notified. Pt's breathing treatment was left at bedside incase she changed her mind.

## 2020-08-27 NOTE — H&P (View-Only) (Signed)
Referring Provider:  Primary Care Physician:  Street, Sharon Mt, MD Primary Gastroenterologist:  Dr. Alessandra Bevels  Reason for Consultation:  Rectal bleeding  HPI: Kelly Phillips is a 75 y.o. female with history of COPD (on 3L via [place] chronically), upper GI bleeding (due to gastric ulcer), Barrett's esophagus and GERD presenting for consultation of rectal bleeding.  Patient reports that she started passing blood clots and dark red stool on 12/30 and 12/31.  She states since that time, she is continued to see red blood in the toilet bowl and on the toilet tissue, and thus she presented to the ED.  She is visually impaired so states she is unable to adequately assess the amount of bleeding but notes that there was bright blood and clots in the toilet bowl.  Denies seeing any melena.  Reports lower abdominal pressure but no overt pain.  She reports 4 stools today, three of which were bloody.  States she feels tired and more short of breath than usual.  Denies any chest pain.  Denies any nausea, vomiting, hematemesis, changes in appetite, dysphagia, or unexplained weight loss.  She takes 81 mg aspirin daily.  She is on a vasodilator but not on any anticoagulation.  Denies NSAID use.  Denies family history of colon cancer or gastrointestinal history.  No prior colonoscopy.  EGD 02/2020: Barrett's, negative for H pylori, repeat 3 years.  EGD 07/2019: large prepyloric ulcer extending into duodenal bulb  Past Medical History:  Diagnosis Date  . COPD (chronic obstructive pulmonary disease) ([place])   . COPD (chronic obstructive pulmonary disease) with emphysema ([place]) 05/02/2015  . COVID-19 virus infection   . PAD (peripheral artery disease) ([place])   . SMA stenosis   . Upper GI bleed 07/26/2019    Past Surgical History:  Procedure Laterality Date  . AORTO-FEMORAL BYPASS GRAFT    . BIOPSY  03/06/2020   Procedure: BIOPSY;  Surgeon: Otis Brace, MD;  Location: [facility] ENDOSCOPY;  Service:  Gastroenterology;;  . ESOPHAGOGASTRODUODENOSCOPY (EGD) WITH PROPOFOL N/A 07/31/2019   Procedure: ESOPHAGOGASTRODUODENOSCOPY (EGD) WITH PROPOFOL;  Surgeon: Wonda Horner, MD;  Location: [facility] ENDOSCOPY;  Service: Endoscopy;  Laterality: N/A;  . ESOPHAGOGASTRODUODENOSCOPY (EGD) WITH PROPOFOL N/A 03/06/2020   Procedure: ESOPHAGOGASTRODUODENOSCOPY (EGD) WITH PROPOFOL;  Surgeon: Otis Brace, MD;  Location: [facility] ENDOSCOPY;  Service: Gastroenterology;  Laterality: N/A;  . HEMOSTASIS CLIP PLACEMENT  07/31/2019   Procedure: HEMOSTASIS CLIP PLACEMENT;  Surgeon: Wonda Horner, MD;  Location: [facility] ENDOSCOPY;  Service: Endoscopy;;  . Clide Deutscher  07/31/2019   Procedure: SCLEROTHERAPY;  Surgeon: Wonda Horner, MD;  Location: [facility] ENDOSCOPY;  Service: Endoscopy;;    Prior to Admission medications   Medication Sig Start Date End Date Taking? Authorizing Provider  ALPRAZolam Duanne Moron) 0.5 MG tablet Take 0.25-0.5 mg by mouth See admin instructions. Take 1/2 tablet (0.25 mg) by mouth every morning and 1 tablet (0.5 mg) at night 01/25/20  Yes [provider]  amLODipine-benazepril (LOTREL) 5-10 MG capsule Take 1 capsule by mouth at bedtime. 09/01/19  Yes [provider]  aspirin EC 81 MG tablet Take 81 mg by mouth at bedtime. Swallow whole.   Yes [provider]  baclofen (LIORESAL) 20 MG tablet Take 10 mg by mouth 2 (two) times daily as needed for muscle spasms. 08/01/20  Yes [provider]  Cholecalciferol (VITAMIN D-3) 125 MCG (5000 UT) TABS Take 5,000 Units by mouth at bedtime.   Yes [provider]  cyanocobalamin (,VITAMIN B-12,) 1000 MCG/ML injection Inject 1,000 mcg into  the muscle every 30 (thirty) days.   Yes [provider]  docusate sodium (COLACE) 100 MG capsule Take 100-200 mg by mouth See admin instructions. Take one capsule (100 mg) by mouth every other night at bedtime, take two capsules (200 mg) on alternate nights   Yes [provider]   fluticasone (FLOVENT HFA) 110 MCG/ACT inhaler Inhale 2 puffs into the lungs daily.   Yes [provider]  gabapentin (NEURONTIN) 300 MG capsule Take 300-600 mg by mouth See admin instructions. Take one capsule (300 mg) by mouth after lunch and two capsules (600 mg) at night 11/28/15  Yes [provider]  omeprazole (PRILOSEC) 40 MG capsule Take 1 capsule (40 mg total) by mouth daily before breakfast. Patient taking differently: Take 40 mg by mouth daily after lunch. 03/06/20 06/04/20 Yes Brahmbhatt, Parag, MD  OXYGEN Inhale 3 L into the lungs continuous.   Yes [provider]  polyethylene glycol (MIRALAX / GLYCOLAX) 17 g packet Take 17 g by mouth daily as needed for mild constipation or moderate constipation. 08/09/19  Yes Narda Bonds, MD  Tiotropium Bromide-Olodaterol (STIOLTO RESPIMAT) 2.5-2.5 MCG/ACT AERS Inhale 2 puffs into the lungs daily.   Yes [provider]  cilostazol (PLETAL) 100 MG tablet Take 100 mg by mouth 2 (two) times daily. 08/06/20   [provider]    Scheduled Meds: . ALPRAZolam  0.25 mg Oral Daily   And  . ALPRAZolam  0.5 mg Oral QHS  . amLODipine  5 mg Oral QHS   And  . benazepril  10 mg Oral QHS  . arformoterol  15 mcg Nebulization BID  . pantoprazole (PROTONIX) IV  40 mg Intravenous Q12H  . umeclidinium bromide  1 puff Inhalation Daily   Continuous Infusions: . lactated ringers 75 mL/hr at 08/26/20 2325   PRN Meds:.acetaminophen **OR** acetaminophen, baclofen, ondansetron **OR** ondansetron (ZOFRAN) IV  Allergies as of 08/26/2020 - Review Complete 08/26/2020  Allergen Reaction Noted  . Codeine Swelling 04/04/2015  . Salmon [fish allergy] Anaphylaxis and Swelling 04/04/2015  . Penicillins Nausea And Vomiting 07/27/2019  . Aspirin Tinitus 04/04/2015    Family History  Problem Relation Age of Onset  . Cancer Father     Social History   Socioeconomic History  . Marital status: Widowed    Spouse name: Not  on file  . Number of children: Not on file  . Years of education: Not on file  . Highest education level: Not on file  Occupational History  . Not on file  Tobacco Use  . Smoking status: Former Smoker    Quit date: 05/02/2015    Years since quitting: 5.3  . Smokeless tobacco: Never Used  Vaping Use  . Vaping Use: Never used  Substance and Sexual Activity  . Alcohol use: Not on file  . Drug use: Never  . Sexual activity: Not Currently  Other Topics Concern  . Not on file  Social History Narrative  . Not on file   Social Determinants of Health   Financial Resource Strain: Not on file  Food Insecurity: Not on file  Transportation Needs: Not on file  Physical Activity: Not on file  Stress: Not on file  Social Connections: Not on file  Intimate Partner Violence: Not on file    Review of Systems: Review of Systems  Constitutional: Positive for malaise/fatigue. Negative for chills, fever and weight loss.  HENT: Negative for hearing loss and tinnitus.   Eyes: Negative for pain and redness.  Respiratory: Positive for shortness of breath. Negative for cough.   Cardiovascular: Negative for chest pain and palpitations.  Gastrointestinal: Positive for blood in stool, diarrhea and heartburn. Negative for abdominal pain, constipation, melena, nausea and vomiting.  Genitourinary: Negative for flank pain and hematuria.  Musculoskeletal: Negative for falls and joint pain.  Skin: Negative for itching and rash.  Neurological: Negative for seizures and loss of consciousness.  Endo/Heme/Allergies: Negative for polydipsia. Does not bruise/bleed easily.  Psychiatric/Behavioral: Negative for substance abuse. The patient is not nervous/anxious.      Physical Exam: Vital signs: Vitals:   08/27/20 0908 08/27/20 1008  BP:  133/73  Pulse: 92 (!) 103  Resp: 17 19  Temp:  (!) 97.5 F (36.4 C)  SpO2: 99% 98%     Physical Exam Vitals reviewed.  Constitutional:      General: She is not in  acute distress.    Interventions: Nasal cannula in place.  HENT:     Head: Normocephalic and atraumatic.     Nose: Nose normal. No congestion.     Mouth/Throat:     Mouth: Mucous membranes are moist.     Pharynx: Oropharynx is clear.  Eyes:     General: No scleral icterus.    Extraocular Movements: Extraocular movements intact.     Conjunctiva/sclera: Conjunctivae normal.  Cardiovascular:     Rate and Rhythm: Normal rate and regular rhythm.     Pulses: Normal pulses.  Pulmonary:     Effort: Pulmonary effort is normal. No respiratory distress.     Breath sounds: Normal breath sounds.  Abdominal:     General: Bowel sounds are normal. There is no distension.     Palpations: Abdomen is soft. There is no mass.     Tenderness: There is no abdominal tenderness. There is no guarding or rebound.     Hernia: No hernia is present.  Musculoskeletal:        General: No swelling or tenderness.     Cervical back: Normal range of motion.  Skin:    General: Skin is warm and dry.  Neurological:     General: No focal deficit present.     Mental Status: She is alert and oriented to person, place, and time.  Psychiatric:        Mood and Affect: Mood normal.        Behavior: Behavior normal. Behavior is cooperative.      GI:  Lab Results: Recent Labs    08/26/20 1749 08/26/20 2239 08/27/20 0417  WBC 6.4 7.5 6.8  HGB 13.4 13.0 11.4*  HCT 42.7 42.3 37.0  PLT 185 201 184   BMET Recent Labs    08/26/20 1235 08/27/20 0417  NA 136 136  K 4.6 4.1  CL 98 104  CO2 26 24  GLUCOSE 98 68*  BUN 13 10  CREATININE 0.90 0.89  CALCIUM 9.8 8.9   LFT Recent Labs    08/27/20 0417  PROT 6.3*  ALBUMIN 3.5  AST 22  ALT 14  ALKPHOS 60  BILITOT 1.3*   PT/INR Recent Labs    08/26/20 2239  LABPROT 12.6  INR 1.0     Studies/Results: CT ABDOMEN PELVIS W CONTRAST  Result Date: 08/26/2020 CLINICAL DATA:  Bloody stool EXAM: CT ABDOMEN AND PELVIS WITH CONTRAST TECHNIQUE: Multidetector  CT imaging of the abdomen and pelvis was performed using the standard protocol following bolus administration of intravenous contrast. CONTRAST:  15mL OMNIPAQUE IOHEXOL 300 MG/ML  SOLN COMPARISON:  CT  07/21/2020, 07/26/2019 FINDINGS: Lower chest: Lung bases demonstrate emphysema. Atelectasis or scar at the lingula. No acute consolidation or effusion. Hepatobiliary: No calcified gallstone. No focal hepatic abnormality. No biliary dilatation. Pancreas: Unremarkable. No pancreatic ductal dilatation or surrounding inflammatory changes. Spleen: Normal in size without focal abnormality. Adrenals/Urinary Tract: Adrenal glands are normal. Kidneys show no hydronephrosis. Scarring at the lower pole of right kidney. The bladder is unremarkable Stomach/Bowel: The stomach is nonenlarged. No dilated small bowel. Extensive sigmoid colon diverticular disease without acute inflammatory change. Vascular/Lymphatic: Advanced aortic atherosclerosis. Stent within the proximal superior mesenteric artery. Patient is status post aortobifemoral bypass with patency of the bypass. Calcified and chronically occluded native aorta and iliac vessels. No suspicious adenopathy Reproductive: Status post hysterectomy. No adnexal masses. Other: Negative for free air or free fluid. Diastasis of the rectus with anterior bulging of mesenteric fat and colon. Musculoskeletal: No acute or suspicious osseous abnormality. Mild chronic superior endplate deformity at 624THL. IMPRESSION: 1. No CT evidence for acute intra-abdominal or pelvic abnormality. 2. Extensive sigmoid colon diverticular disease without acute inflammatory change. 3. Status post aortobifemoral bypass with patency. 4. Emphysema and aortic atherosclerosis. Aortic Atherosclerosis (ICD10-I70.0) and Emphysema (ICD10-J43.9). Electronically Signed   By: Donavan Foil M.D.   On: 08/26/2020 18:56    Impression: Painless hematochezia, most consistent with diverticular bleeding.  Diverticulosis on  CT-A, no active bleeding seen.  No prior colonoscopy. -Hemoglobin 11.4, decreased from 13.0 yesterday and 13.4 on arrival -81 mg ASA but no anticoagulation use -Normal BUN/creatinine) 10/0.89  COPD, on 3L O2 chronically  Plan: Due to transportation limitations and patient declining outpatient colonoscopy, recommend proceeding with inpatient colonoscopy tomorrow to rule out other causes of bleeding.  Additionally, possible treatment can be offered if bleeding is visualized on colonoscopy.  I thoroughly discussed procedure with the patient to include nature, terms, benefits, and risks (including but not limited to bleeding, infection, perforation, anesthesia/cardiac and pulmonary complications).  Patient verbalized understanding gave verbal consent to proceed with colonoscopy.  Clear liquid diet today with Nulytely prep.  NPO after midnight.  Continue to monitor H&H with transfusion as needed to maintain hemoglobin greater than 7.  [facility] will follow.   LOS: 0 days   Salley Slaughter  PA-C 08/27/2020, 10:21 AM  Contact #  [phone]

## 2020-08-27 NOTE — Consult Note (Signed)
Referring Provider:  Primary Care Physician:  Street, Sharon Mt, MD Primary Gastroenterologist:  Dr. Alessandra Bevels  Reason for Consultation:  Rectal bleeding  HPI: Kelly Phillips is a 75 y.o. female with history of COPD (on 3L via [place] chronically), upper GI bleeding (due to gastric ulcer), Barrett's esophagus and GERD presenting for consultation of rectal bleeding.  Patient reports that she started passing blood clots and dark red stool on 12/30 and 12/31.  She states since that time, she is continued to see red blood in the toilet bowl and on the toilet tissue, and thus she presented to the ED.  She is visually impaired so states she is unable to adequately assess the amount of bleeding but notes that there was bright blood and clots in the toilet bowl.  Denies seeing any melena.  Reports lower abdominal pressure but no overt pain.  She reports 4 stools today, three of which were bloody.  States she feels tired and more short of breath than usual.  Denies any chest pain.  Denies any nausea, vomiting, hematemesis, changes in appetite, dysphagia, or unexplained weight loss.  She takes 81 mg aspirin daily.  She is on a vasodilator but not on any anticoagulation.  Denies NSAID use.  Denies family history of colon cancer or gastrointestinal history.  No prior colonoscopy.  EGD 02/2020: Barrett's, negative for H pylori, repeat 3 years.  EGD 07/2019: large prepyloric ulcer extending into duodenal bulb  Past Medical History:  Diagnosis Date  . COPD (chronic obstructive pulmonary disease) ([place])   . COPD (chronic obstructive pulmonary disease) with emphysema ([place]) 05/02/2015  . COVID-19 virus infection   . PAD (peripheral artery disease) ([place])   . SMA stenosis   . Upper GI bleed 07/26/2019    Past Surgical History:  Procedure Laterality Date  . AORTO-FEMORAL BYPASS GRAFT    . BIOPSY  03/06/2020   Procedure: BIOPSY;  Surgeon: Otis Brace, MD;  Location: [facility] ENDOSCOPY;  Service:  Gastroenterology;;  . ESOPHAGOGASTRODUODENOSCOPY (EGD) WITH PROPOFOL N/A 07/31/2019   Procedure: ESOPHAGOGASTRODUODENOSCOPY (EGD) WITH PROPOFOL;  Surgeon: Wonda Horner, MD;  Location: [facility] ENDOSCOPY;  Service: Endoscopy;  Laterality: N/A;  . ESOPHAGOGASTRODUODENOSCOPY (EGD) WITH PROPOFOL N/A 03/06/2020   Procedure: ESOPHAGOGASTRODUODENOSCOPY (EGD) WITH PROPOFOL;  Surgeon: Otis Brace, MD;  Location: [facility] ENDOSCOPY;  Service: Gastroenterology;  Laterality: N/A;  . HEMOSTASIS CLIP PLACEMENT  07/31/2019   Procedure: HEMOSTASIS CLIP PLACEMENT;  Surgeon: Wonda Horner, MD;  Location: [facility] ENDOSCOPY;  Service: Endoscopy;;  . Clide Deutscher  07/31/2019   Procedure: SCLEROTHERAPY;  Surgeon: Wonda Horner, MD;  Location[facility] ENDOSCOPY;  Service: Endoscopy;;    Prior to Admission medications   Medication Sig Start Date End Date Taking? Authorizing Provider  ALPRAZolam Duanne Moron) 0.5 MG tablet Take 0.25-0.5 mg by mouth See admin instructions. Take 1/2 tablet (0.25 mg) by mouth every morning and 1 tablet (0.5 mg) at night 01/25/20  Yes [provider]  amLODipine-benazepril (LOTREL) 5-10 MG capsule Take 1 capsule by mouth at bedtime. 09/01/19  Yes [provider]  aspirin EC 81 MG tablet Take 81 mg by mouth at bedtime. Swallow whole.   Yes [provider]  baclofen (LIORESAL) 20 MG tablet Take 10 mg by mouth 2 (two) times daily as needed for muscle spasms. 08/01/20  Yes [provider]  Cholecalciferol (VITAMIN D-3) 125 MCG (5000 UT) TABS Take 5,000 Units by mouth at bedtime.   Yes [provider]  cyanocobalamin (,VITAMIN B-12,) 1000 MCG/ML injection Inject 1,000 mcg into  the muscle every 30 (thirty) days.   Yes [provider]  docusate sodium (COLACE) 100 MG capsule Take 100-200 mg by mouth See admin instructions. Take one capsule (100 mg) by mouth every other night at bedtime, take two capsules (200 mg) on alternate nights   Yes [provider]   fluticasone (FLOVENT HFA) 110 MCG/ACT inhaler Inhale 2 puffs into the lungs daily.   Yes [provider]  gabapentin (NEURONTIN) 300 MG capsule Take 300-600 mg by mouth See admin instructions. Take one capsule (300 mg) by mouth after lunch and two capsules (600 mg) at night 11/28/15  Yes [provider]  omeprazole (PRILOSEC) 40 MG capsule Take 1 capsule (40 mg total) by mouth daily before breakfast. Patient taking differently: Take 40 mg by mouth daily after lunch. 03/06/20 06/04/20 Yes Brahmbhatt, Parag, MD  OXYGEN Inhale 3 L into the lungs continuous.   Yes [provider]  polyethylene glycol (MIRALAX / GLYCOLAX) 17 g packet Take 17 g by mouth daily as needed for mild constipation or moderate constipation. 08/09/19  Yes Narda Bonds, MD  Tiotropium Bromide-Olodaterol (STIOLTO RESPIMAT) 2.5-2.5 MCG/ACT AERS Inhale 2 puffs into the lungs daily.   Yes [provider]  cilostazol (PLETAL) 100 MG tablet Take 100 mg by mouth 2 (two) times daily. 08/06/20   [provider]    Scheduled Meds: . ALPRAZolam  0.25 mg Oral Daily   And  . ALPRAZolam  0.5 mg Oral QHS  . amLODipine  5 mg Oral QHS   And  . benazepril  10 mg Oral QHS  . arformoterol  15 mcg Nebulization BID  . pantoprazole (PROTONIX) IV  40 mg Intravenous Q12H  . umeclidinium bromide  1 puff Inhalation Daily   Continuous Infusions: . lactated ringers 75 mL/hr at 08/26/20 2325   PRN Meds:.acetaminophen **OR** acetaminophen, baclofen, ondansetron **OR** ondansetron (ZOFRAN) IV  Allergies as of 08/26/2020 - Review Complete 08/26/2020  Allergen Reaction Noted  . Codeine Swelling 04/04/2015  . Salmon [fish allergy] Anaphylaxis and Swelling 04/04/2015  . Penicillins Nausea And Vomiting 07/27/2019  . Aspirin Tinitus 04/04/2015    Family History  Problem Relation Age of Onset  . Cancer Father     Social History   Socioeconomic History  . Marital status: Widowed    Spouse name: Not  on file  . Number of children: Not on file  . Years of education: Not on file  . Highest education level: Not on file  Occupational History  . Not on file  Tobacco Use  . Smoking status: Former Smoker    Quit date: 05/02/2015    Years since quitting: 5.3  . Smokeless tobacco: Never Used  Vaping Use  . Vaping Use: Never used  Substance and Sexual Activity  . Alcohol use: Not on file  . Drug use: Never  . Sexual activity: Not Currently  Other Topics Concern  . Not on file  Social History Narrative  . Not on file   Social Determinants of Health   Financial Resource Strain: Not on file  Food Insecurity: Not on file  Transportation Needs: Not on file  Physical Activity: Not on file  Stress: Not on file  Social Connections: Not on file  Intimate Partner Violence: Not on file    Review of Systems: Review of Systems  Constitutional: Positive for malaise/fatigue. Negative for chills, fever and weight loss.  HENT: Negative for hearing loss and tinnitus.   Eyes: Negative for pain and redness.  Respiratory: Positive for shortness of breath. Negative for cough.   Cardiovascular: Negative for chest pain and palpitations.  Gastrointestinal: Positive for blood in stool, diarrhea and heartburn. Negative for abdominal pain, constipation, melena, nausea and vomiting.  Genitourinary: Negative for flank pain and hematuria.  Musculoskeletal: Negative for falls and joint pain.  Skin: Negative for itching and rash.  Neurological: Negative for seizures and loss of consciousness.  Endo/Heme/Allergies: Negative for polydipsia. Does not bruise/bleed easily.  Psychiatric/Behavioral: Negative for substance abuse. The patient is not nervous/anxious.      Physical Exam: Vital signs: Vitals:   08/27/20 0908 08/27/20 1008  BP:  133/73  Pulse: 92 (!) 103  Resp: 17 19  Temp:  (!) 97.5 F (36.4 C)  SpO2: 99% 98%     Physical Exam Vitals reviewed.  Constitutional:      General: She is not in  acute distress.    Interventions: Nasal cannula in place.  HENT:     Head: Normocephalic and atraumatic.     Nose: Nose normal. No congestion.     Mouth/Throat:     Mouth: Mucous membranes are moist.     Pharynx: Oropharynx is clear.  Eyes:     General: No scleral icterus.    Extraocular Movements: Extraocular movements intact.     Conjunctiva/sclera: Conjunctivae normal.  Cardiovascular:     Rate and Rhythm: Normal rate and regular rhythm.     Pulses: Normal pulses.  Pulmonary:     Effort: Pulmonary effort is normal. No respiratory distress.     Breath sounds: Normal breath sounds.  Abdominal:     General: Bowel sounds are normal. There is no distension.     Palpations: Abdomen is soft. There is no mass.     Tenderness: There is no abdominal tenderness. There is no guarding or rebound.     Hernia: No hernia is present.  Musculoskeletal:        General: No swelling or tenderness.     Cervical back: Normal range of motion.  Skin:    General: Skin is warm and dry.  Neurological:     General: No focal deficit present.     Mental Status: She is alert and oriented to person, place, and time.  Psychiatric:        Mood and Affect: Mood normal.        Behavior: Behavior normal. Behavior is cooperative.      GI:  Lab Results: Recent Labs    08/26/20 1749 08/26/20 2239 08/27/20 0417  WBC 6.4 7.5 6.8  HGB 13.4 13.0 11.4*  HCT 42.7 42.3 37.0  PLT 185 201 184   BMET Recent Labs    08/26/20 1235 08/27/20 0417  NA 136 136  K 4.6 4.1  CL 98 104  CO2 26 24  GLUCOSE 98 68*  BUN 13 10  CREATININE 0.90 0.89  CALCIUM 9.8 8.9   LFT Recent Labs    08/27/20 0417  PROT 6.3*  ALBUMIN 3.5  AST 22  ALT 14  ALKPHOS 60  BILITOT 1.3*   PT/INR Recent Labs    08/26/20 2239  LABPROT 12.6  INR 1.0     Studies/Results: CT ABDOMEN PELVIS W CONTRAST  Result Date: 08/26/2020 CLINICAL DATA:  Bloody stool EXAM: CT ABDOMEN AND PELVIS WITH CONTRAST TECHNIQUE: Multidetector  CT imaging of the abdomen and pelvis was performed using the standard protocol following bolus administration of intravenous contrast. CONTRAST:  100mL OMNIPAQUE IOHEXOL 300 MG/ML  SOLN COMPARISON:  CT   07/21/2020, 07/26/2019 FINDINGS: Lower chest: Lung bases demonstrate emphysema. Atelectasis or scar at the lingula. No acute consolidation or effusion. Hepatobiliary: No calcified gallstone. No focal hepatic abnormality. No biliary dilatation. Pancreas: Unremarkable. No pancreatic ductal dilatation or surrounding inflammatory changes. Spleen: Normal in size without focal abnormality. Adrenals/Urinary Tract: Adrenal glands are normal. Kidneys show no hydronephrosis. Scarring at the lower pole of right kidney. The bladder is unremarkable Stomach/Bowel: The stomach is nonenlarged. No dilated small bowel. Extensive sigmoid colon diverticular disease without acute inflammatory change. Vascular/Lymphatic: Advanced aortic atherosclerosis. Stent within the proximal superior mesenteric artery. Patient is status post aortobifemoral bypass with patency of the bypass. Calcified and chronically occluded native aorta and iliac vessels. No suspicious adenopathy Reproductive: Status post hysterectomy. No adnexal masses. Other: Negative for free air or free fluid. Diastasis of the rectus with anterior bulging of mesenteric fat and colon. Musculoskeletal: No acute or suspicious osseous abnormality. Mild chronic superior endplate deformity at 624THL. IMPRESSION: 1. No CT evidence for acute intra-abdominal or pelvic abnormality. 2. Extensive sigmoid colon diverticular disease without acute inflammatory change. 3. Status post aortobifemoral bypass with patency. 4. Emphysema and aortic atherosclerosis. Aortic Atherosclerosis (ICD10-I70.0) and Emphysema (ICD10-J43.9). Electronically Signed   By: Donavan Foil M.D.   On: 08/26/2020 18:56    Impression: Painless hematochezia, most consistent with diverticular bleeding.  Diverticulosis on  CT-A, no active bleeding seen.  No prior colonoscopy. -Hemoglobin 11.4, decreased from 13.0 yesterday and 13.4 on arrival -81 mg ASA but no anticoagulation use -Normal BUN/creatinine) 10/0.89  COPD, on 3L O2 chronically  Plan: Due to transportation limitations and patient declining outpatient colonoscopy, recommend proceeding with inpatient colonoscopy tomorrow to rule out other causes of bleeding.  Additionally, possible treatment can be offered if bleeding is visualized on colonoscopy.  I thoroughly discussed procedure with the patient to include nature, terms, benefits, and risks (including but not limited to bleeding, infection, perforation, anesthesia/cardiac and pulmonary complications).  Patient verbalized understanding gave verbal consent to proceed with colonoscopy.  Clear liquid diet today with Nulytely prep.  NPO after midnight.  Continue to monitor H&H with transfusion as needed to maintain hemoglobin greater than 7.  [facility] will follow.   LOS: 0 days   Salley Slaughter  PA-C 08/27/2020, 10:21 AM  Contact #  [phone]

## 2020-08-27 NOTE — ED Notes (Signed)
Pt OOB to bedside commode.

## 2020-08-27 NOTE — ED Notes (Signed)
GI at bedside

## 2020-08-27 NOTE — Progress Notes (Signed)
[facility] PROGRESS NOTE    Kelly Phillips  [id number] DOB: 1945/11/20 DOA: 08/26/2020 PCP: Venetia Maxon, Sharon Mt, MD      Brief Narrative:  Kelly Phillips is a 75 y.o. F with COPD on home O2, PVD and PUD with clipping who presented with maroon stools.  Began to have maroon-colored stools with blood clots on several days ago.  This was continued until she came to the ER.  The ER, had a large maroon-colored stool witnessed by staff.  Hemoglobin initially normal, case discussed with GI who recommended observation.    Assessment & Plan:  GI bleed Ongoing maroon stool overnight -Consult GI -Trend Hgb -Continue IV PPI -Hold aspirin   COPD without exacerbation Chronic hypoxic respiratory failure -Continue home LABA/LAMA  Hypertension -Continue benazepril, amlodipine  Anxiety -Continue home Xanax       Disposition:  Status is: Inpatient  Remains inpatient appropriate because:Ongoing diagnostic testing needed not appropriate for outpatient work up   Dispo: The patient is from: Home              Anticipated d/c is to: Home              Anticipated d/c date is: 1 day              Patient currently is not medically stable to d/c.                     MDM: The below labs and imaging reports were reviewed and summarized above.  Medication management as above.    DVT prophylaxis: SCDs Start: 08/26/20 2157  Code Status: DNR Family Communication:             Subjective: Ongoing maroon stools overnight.  She has no dizziness, dyspnea, confusion.  No fever.  No hematemesis, no vomiting.  Objective: Vitals:   08/27/20 1500 08/27/20 1519 08/27/20 1600 08/27/20 1800  BP: 114/74  118/71 132/77  Pulse: 98  93 96  Resp: (!) 24  20 (!) 22  Temp:  97.6 F (36.4 C)    TempSrc:  Oral    SpO2: 100%  100% 100%    Intake/Output Summary (Last 24 hours) at 08/27/2020 1929 Last data filed at 08/26/2020 2000 Gross per 24 hour   Intake 1000 ml  Output --  Net 1000 ml   There were no vitals filed for this visit.  Examination: General appearance:  adult female, alert and in no acute distress.   HEENT: Anicteric, conjunctiva pink, lids and lashes normal. No nasal deformity, discharge, epistaxis.  Lips moist.   Skin: Warm and dry.  no jaundice.  No suspicious rashes or lesions. Cardiac: RRR, nl S1-S2, no murmurs appreciated.  Capillary refill is brisk.  JVP not visible.  No LE edema.  Radial pulses 2+ and symmetric. Respiratory: Normal respiratory rate and rhythm.  CTAB without rales or wheezes. Abdomen: Abdomen soft.  no TTP or guarding. No ascites, distension, hepatosplenomegaly.   MSK: No deformities or effusions. Neuro: Awake and alert.  EOMI, moves all extremities. Speech fluent.    Psych: Sensorium intact and responding to questions, attention normal. Affect normal.  Judgment and insight appear normal.    Data Reviewed: I have personally reviewed following labs and imaging studies:  CBC: Recent Labs  Lab 08/26/20 1749 08/26/20 2239 08/27/20 0417 08/27/20 1327  WBC 6.4 7.5 6.8 5.9  NEUTROABS 5.2  --   --   --   HGB 13.4 13.0 11.4* 10.3*  HCT 42.7 42.3 37.0 32.4*  MCV 101.9* 102.4* 101.9* 101.6*  PLT 185 201 184 166   Basic Metabolic Panel: Recent Labs  Lab 08/26/20 1235 08/27/20 0417  NA 136 136  K 4.6 4.1  CL 98 104  CO2 26 24  GLUCOSE 98 68*  BUN 13 10  CREATININE 0.90 0.89  CALCIUM 9.8 8.9  MG  --  1.7   GFR: CrCl cannot be calculated (Unknown ideal weight.). Liver Function Tests: Recent Labs  Lab 08/26/20 1235 08/27/20 0417  AST 28 22  ALT 16 14  ALKPHOS 79 60  BILITOT 0.9 1.3*  PROT 7.8 6.3*  ALBUMIN 4.3 3.5   No results for input(s): LIPASE, AMYLASE in the last 168 hours. No results for input(s): AMMONIA in the last 168 hours. Coagulation Profile: Recent Labs  Lab 08/26/20 2239  INR 1.0   Cardiac Enzymes: No results for input(s): CKTOTAL, CKMB, CKMBINDEX,  TROPONINI in the last 168 hours. BNP (last 3 results) No results for input(s): PROBNP in the last 8760 hours. HbA1C: No results for input(s): HGBA1C in the last 72 hours. CBG: No results for input(s): GLUCAP in the last 168 hours. Lipid Profile: No results for input(s): CHOL, HDL, LDLCALC, TRIG, CHOLHDL, LDLDIRECT in the last 72 hours. Thyroid Function Tests: No results for input(s): TSH, T4TOTAL, FREET4, T3FREE, THYROIDAB in the last 72 hours. Anemia Panel: Recent Labs    08/26/20 2239  RETICCTPCT 2.9   Urine analysis:    Component Value Date/Time   COLORURINE YELLOW 05/23/2015 1521   APPEARANCEUR CLEAR 05/23/2015 1521   LABSPEC 1.006 05/23/2015 1521   PHURINE 8.5 (H) 05/23/2015 1521   GLUCOSEU NEGATIVE 05/23/2015 1521   HGBUR TRACE (A) 05/23/2015 1521   BILIRUBINUR NEGATIVE 05/23/2015 1521   KETONESUR NEGATIVE 05/23/2015 1521   PROTEINUR NEGATIVE 05/23/2015 1521   UROBILINOGEN 0.2 05/23/2015 1521   NITRITE NEGATIVE 05/23/2015 1521   LEUKOCYTESUR LARGE (A) 05/23/2015 1521   Sepsis Labs: @LABRCNTIP (procalcitonin:4,lacticacidven:4)  ) Recent Results (from the past 240 hour(s))  Resp Panel by RT-PCR (Flu A&B, Covid) Nasopharyngeal Swab     Status: None   Collection Time: 08/26/20 10:12 PM   Specimen: Nasopharyngeal Swab; Nasopharyngeal(NP) swabs in vial transport medium  Result Value Ref Range Status   SARS Coronavirus 2 by RT PCR NEGATIVE NEGATIVE Final    Comment: (NOTE) SARS-CoV-2 target nucleic acids are NOT DETECTED.  The SARS-CoV-2 RNA is generally detectable in upper respiratory specimens during the acute phase of infection. The lowest concentration of SARS-CoV-2 viral copies this assay can detect is 138 copies/mL. A negative result does not preclude SARS-Cov-2 infection and should not be used as the sole basis for treatment or other patient management decisions. A negative result may occur with  improper specimen collection/handling, submission of specimen  other than nasopharyngeal swab, presence of viral mutation(s) within the areas targeted by this assay, and inadequate number of viral copies(<138 copies/mL). A negative result must be combined with clinical observations, patient history, and epidemiological information. The expected result is Negative.  Fact Sheet for Patients:  10/24/20  Fact Sheet for Healthcare Providers:  BloggerCourse.com  This test is no t yet approved or cleared by the [employer] FDA and  has been authorized for detection and/or diagnosis of SARS-CoV-2 by FDA under an Emergency Use Authorization (EUA). This EUA will remain  in effect (meaning this test can be used) for the duration of the COVID-19 declaration under Section 564(b)(1) of the Act, 21 U.S.C.section 360bbb-3(b)(1), unless the authorization  is terminated  or revoked sooner.       Influenza A by PCR NEGATIVE NEGATIVE Final   Influenza B by PCR NEGATIVE NEGATIVE Final    Comment: (NOTE) The Xpert Xpress SARS-CoV-2/FLU/RSV plus assay is intended as an aid in the diagnosis of influenza from Nasopharyngeal swab specimens and should not be used as a sole basis for treatment. Nasal washings and aspirates are unacceptable for Xpert Xpress SARS-CoV-2/FLU/RSV testing.  Fact Sheet for Patients: EntrepreneurPulse.com.au  Fact Sheet for Healthcare Providers: IncredibleEmployment.be  This test is not yet approved or cleared by the [place] FDA and has been authorized for detection and/or diagnosis of SARS-CoV-2 by FDA under an Emergency Use Authorization (EUA). This EUA will remain in effect (meaning this test can be used) for the duration of the COVID-19 declaration under Section 564(b)(1) of the Act, 21 U.S.C. section 360bbb-3(b)(1), unless the authorization is terminated or revoked.  Performed at [facility], [place] [address] [place],  [place] [address]          Radiology Studies: CT ABDOMEN PELVIS W CONTRAST  Result Date: 08/26/2020 CLINICAL DATA:  Bloody stool EXAM: CT ABDOMEN AND PELVIS WITH CONTRAST TECHNIQUE: Multidetector CT imaging of the abdomen and pelvis was performed using the standard protocol following bolus administration of intravenous contrast. CONTRAST:  18mL OMNIPAQUE IOHEXOL 300 MG/ML  SOLN COMPARISON:  CT 07/21/2020, 07/26/2019 FINDINGS: Lower chest: Lung bases demonstrate emphysema. Atelectasis or scar at the lingula. No acute consolidation or effusion. Hepatobiliary: No calcified gallstone. No focal hepatic abnormality. No biliary dilatation. Pancreas: Unremarkable. No pancreatic ductal dilatation or surrounding inflammatory changes. Spleen: Normal in size without focal abnormality. Adrenals/Urinary Tract: Adrenal glands are normal. Kidneys show no hydronephrosis. Scarring at the lower pole of right kidney. The bladder is unremarkable Stomach/Bowel: The stomach is nonenlarged. No dilated small bowel. Extensive sigmoid colon diverticular disease without acute inflammatory change. Vascular/Lymphatic: Advanced aortic atherosclerosis. Stent within the proximal superior mesenteric artery. Patient is status post aortobifemoral bypass with patency of the bypass. Calcified and chronically occluded native aorta and iliac vessels. No suspicious adenopathy Reproductive: Status post hysterectomy. No adnexal masses. Other: Negative for free air or free fluid. Diastasis of the rectus with anterior bulging of mesenteric fat and colon. Musculoskeletal: No acute or suspicious osseous abnormality. Mild chronic superior endplate deformity at 624THL. IMPRESSION: 1. No CT evidence for acute intra-abdominal or pelvic abnormality. 2. Extensive sigmoid colon diverticular disease without acute inflammatory change. 3. Status post aortobifemoral bypass with patency. 4. Emphysema and aortic atherosclerosis. Aortic Atherosclerosis (ICD10-I70.0) and  Emphysema (ICD10-J43.9). Electronically Signed   By: Donavan Foil M.D.   On: 08/26/2020 18:56        Scheduled Meds: . ALPRAZolam  0.25 mg Oral Daily   And  . ALPRAZolam  0.5 mg Oral QHS  . amLODipine  5 mg Oral QHS   And  . benazepril  10 mg Oral QHS  . arformoterol  15 mcg Nebulization BID  . pantoprazole (PROTONIX) IV  40 mg Intravenous Q12H  . polyethylene glycol-electrolytes  4,000 mL Oral Once  . umeclidinium bromide  1 puff Inhalation Daily   Continuous Infusions: . lactated ringers 75 mL/hr at 08/26/20 2325     LOS: 0 days    Time spent: 25 minutes    Edwin Dada, MD [facility] 08/27/2020, 7:29 PM     Please page though [place] or Epic secure chat:  For [employer], Adult nurse

## 2020-08-28 ENCOUNTER — Encounter (HOSPITAL_COMMUNITY): Payer: Self-pay | Admitting: Family Medicine

## 2020-08-28 ENCOUNTER — Inpatient Hospital Stay (HOSPITAL_COMMUNITY): Payer: Medicare Other | Admitting: Certified Registered"

## 2020-08-28 ENCOUNTER — Encounter (HOSPITAL_COMMUNITY)
Admission: EM | Disposition: A | Discharge status: Skilled Nursing Facility | Payer: Self-pay | Source: Home / Self Care | Attending: Family Medicine

## 2020-08-28 DIAGNOSIS — K922 Gastrointestinal hemorrhage, unspecified: Secondary | ICD-10-CM | POA: Diagnosis not present

## 2020-08-28 HISTORY — PX: COLONOSCOPY: SHX5424

## 2020-08-28 HISTORY — PX: POLYPECTOMY: SHX5525

## 2020-08-28 LAB — CBC
HCT: 33.5 % — ABNORMAL LOW (ref 36.0–46.0)
Hemoglobin: 10.9 g/dL — ABNORMAL LOW (ref 12.0–15.0)
MCH: 32.5 pg (ref 26.0–34.0)
MCHC: 32.5 g/dL (ref 30.0–36.0)
MCV: 100 fL (ref 80.0–100.0)
Platelets: 177 10*3/uL (ref 150–400)
RBC: 3.35 MIL/uL — ABNORMAL LOW (ref 3.87–5.11)
RDW: 13.1 % (ref 11.5–15.5)
WBC: 5.6 10*3/uL (ref 4.0–10.5)
nRBC: 0 % (ref 0.0–0.2)

## 2020-08-28 SURGERY — COLONOSCOPY
Anesthesia: Monitor Anesthesia Care

## 2020-08-28 MED ORDER — PANTOPRAZOLE SODIUM 40 MG PO TBEC: 40.0000 mg | DELAYED_RELEASE_TABLET | Freq: Every day | ORAL | 11 refills | Status: DC

## 2020-08-28 MED ORDER — PROPOFOL 500 MG/50ML IV EMUL
INTRAVENOUS | Status: DC | PRN
  Administered 2020-08-28: 100 ug/kg/min via INTRAVENOUS

## 2020-08-28 MED ORDER — LIDOCAINE HCL (CARDIAC) PF 100 MG/5ML IV SOSY
PREFILLED_SYRINGE | INTRAVENOUS | Status: DC | PRN
  Administered 2020-08-28: 40 mg via INTRAVENOUS

## 2020-08-28 MED ORDER — SODIUM CHLORIDE 0.9 % IV SOLN: INTRAVENOUS | Status: DC | PRN

## 2020-08-28 MED ORDER — PROPOFOL 10 MG/ML IV BOLUS
INTRAVENOUS | Status: DC | PRN
  Administered 2020-08-28 (×2): 20 mg via INTRAVENOUS

## 2020-08-28 NOTE — Brief Op Note (Signed)
08/26/2020 - 08/28/2020  12:14 PM  PATIENT:  Kelly Phillips  75 y.o. female  PRE-OPERATIVE DIAGNOSIS:  hematochezia  POST-OPERATIVE DIAGNOSIS:  diverticulosis, sigmoid colon polyp  PROCEDURE:  Procedure(s): COLONOSCOPY (N/A) POLYPECTOMY  SURGEON:  Surgeon(s) and Role:    * Ghadeer Kastelic, MD - Primary  Findings ---------- -Colonoscopy showed scattered amount of blood in the sigmoid colon which was washed and lavage.  No evidence of active bleeding.  Diverticulosis in the sigmoid colon.  Small external and internal hemorrhoids.  Small 3 mm polyp in the sigmoid colon removed with biopsy forcep.  Recommendations ------------------------- -Advance diet -Avoid NSAIDs -Recommend Protonix 40 mg once a day on discharge given history of large gastric ulcer and pyloric stenosis -No further inpatient GI work-up planned -Okay to discharge from GI standpoint later today -GI will sign off.  Call us back if needed  Kathi Der MD, FACP 08/28/2020, 12:16 PM  Contact #  [phone]

## 2020-08-28 NOTE — TOC Initial Note (Addendum)
Transition of Care [facility]) - Initial/Assessment Note    Patient Details  Name: Kelly Phillips MRN: [id number] Date of Birth: [birth year]  [facility] CM/SW Contact:    Marilu Favre, RN Phone Number: 08/28/2020, 3:33 PM  Clinical Narrative:                 Patient being discharged to home. Patient already has home oxygen.   Patient confirmed face sheet information. Patient on phone with caregiver. When PTAR picks patient up here at hospital, patient will call caregiver and caregiver will met her at home.   Messaged MD for DNR form and nurse to see what time to arrange PTAR. Nurse requested PTAR pick up for 5 PM. PTAR called   Expected Discharge Plan: Home/Self Care Barriers to Discharge: No Barriers Identified   Patient Goals and CMS Choice Patient states their goals for this hospitalization and ongoing recovery are:: to return to home CMS Medicare.gov Compare Post Acute Care list provided to:: Patient    Expected Discharge Plan and Services Expected Discharge Plan: Home/Self Care   Discharge Planning Services: CM Consult   Living arrangements for the past 2 months: Single Family Home Expected Discharge Date: 08/28/20                 DME Agency: NA       HH Arranged: NA          Prior Living Arrangements/Services Living arrangements for the past 2 months: Single Family Home Lives with:: Self Patient language and need for interpreter reviewed:: Yes Do you feel safe going back to the place where you live?: Yes      Need for Family Participation in Patient Care: Yes (Comment) Care giver support system in place?: Yes (comment)   Criminal Activity/Legal Involvement Pertinent to Current Situation/Hospitalization: No - Comment as needed  Activities of Daily Living      Permission Sought/Granted   Permission granted to share information with : No              Emotional Assessment Appearance:: Appears stated age Attitude/Demeanor/Rapport:  Engaged Affect (typically observed): Accepting Orientation: : Oriented to Self,Oriented to Place,Oriented to  Time,Oriented to Situation Alcohol / Substance Use: Not Applicable Psych Involvement: No (comment)  Admission diagnosis:  Rectal bleeding [K62.5] Acute lower GI bleeding [K92.2] Upper GI bleed [K92.2] Patient Active Problem List   Diagnosis Date Noted  . Upper GI bleed 08/27/2020  . Acute lower GI bleeding 08/26/2020  . Chronic respiratory failure with hypoxia ([place]) 08/26/2020  . GERD without esophagitis 08/26/2020  . Heartburn 10/28/2019  . Shortness of breath 10/28/2019  . Status post aortobifemoral bypass surgery 09/09/2019  . Tachycardia 09/09/2019  . Acute blood loss anemia   . Thrombocytopenia ([place])   . Exercise hypoxemia 07/10/2016  . Benign essential hypertension 01/17/2016  . Bilateral carotid artery stenosis 01/17/2016  . Former smoker 01/17/2016  . Superior mesenteric artery stenosis ([place]) 01/17/2016  . Multiple lung nodules on CT 12/28/2015  . Atherosclerosis of native artery of extremity with intermittent claudication ([place]) 11/22/2015  . Chronic mesenteric insufficiency ([place]) 11/22/2015  . Edema of extremities 11/22/2015  . Ventilator dependence, reintubated 9/7 for return to OR for thrombectomy, Massive volume overload, may need trach 05/11/2015  . History of smoking 05/11/2015  . Peripheral vascular disease, unspecified ([place]) 05/11/2015  . Arthritis 05/11/2015  . Macular degeneration 05/11/2015  . Aortic occlusion ([place]) 05/02/2015  . COPD (chronic obstructive pulmonary disease) with emphysema ([place]) 05/02/2015  .  Aortic thromboembolism ([place]) 04/04/2015   PCP:  Street, Sharon Mt, MD Pharmacy:   [id number] [place]Phone: [phone]: [phone]    Social Determinants of Health (SDOH) Interventions    Readmission Risk Interventions No flowsheet data  found.

## 2020-08-28 NOTE — Op Note (Signed)
[facility] Patient Name: Kelly Phillips Procedure Date : 08/28/2020 MRN: [id number] Attending MD: Otis Brace , MD Date of Birth: 1945/09/22 CSN: [id number] Age: 75 Admit Type: Inpatient Procedure:                Colonoscopy Indications:              This is the patient's first colonoscopy, Rectal                            bleeding Providers:                Otis Brace, MD, Clyde Lundborg, RN, Tyrone Apple, Technician, Nicholes Calamity, CRNA Referring MD:              Medicines:                Sedation Administered by an Anesthesia Professional Complications:            No immediate complications. Estimated Blood Loss:     Estimated blood loss was minimal. Procedure:                Pre-Anesthesia Assessment:                           - Prior to the procedure, a History and Physical                            was performed, and patient medications and                            allergies were reviewed. The patient's tolerance of                            previous anesthesia was also reviewed. The risks                            and benefits of the procedure and the sedation                            options and risks were discussed with the patient.                            All questions were answered, and informed consent                            was obtained. Prior Anticoagulants: The patient has                            taken no previous anticoagulant or antiplatelet                            agents. ASA Grade Assessment: IV - A patient with  severe systemic disease that is a constant threat                            to life. After reviewing the risks and benefits,                            the patient was deemed in satisfactory condition to                            undergo the procedure.                           After obtaining informed consent, the colonoscope                             was passed under direct vision. Throughout the                            procedure, the patient's blood pressure, pulse, and                            oxygen saturations were monitored continuously. The                            [id number]) Olympus pediatric colonscope                            was introduced through the anus and advanced to the                            the cecum, identified by appendiceal orifice and                            ileocecal valve. The colonoscopy was performed                            without difficulty. The patient tolerated the                            procedure well. The quality of the bowel                            preparation was adequate to identify polyps 6 mm                            and larger in size. Scope In: 11:47:20 AM Scope Out: 12:05:30 PM Scope Withdrawal Time: 0 hours 11 minutes 0 seconds  Total Procedure Duration: 0 hours 18 minutes 10 seconds  Findings:      Hemorrhoids were found on perianal exam.      A 3 mm polyp was found in the sigmoid colon. The polyp was removed with       a piecemeal technique using a cold biopsy forceps. Resection and       retrieval were complete.  Multiple small and large-mouthed diverticula were found in the sigmoid       colon.      Hematin (altered blood/coffee-ground-like material) was found in the       sigmoid colon without evidence of active bleeding.      Internal hemorrhoids were found during retroflexion. The hemorrhoids       were small. Impression:               - Hemorrhoids found on perianal exam.                           - One 3 mm polyp in the sigmoid colon, removed                            piecemeal using a cold biopsy forceps. Resected and                            retrieved.                           - Diverticulosis in the sigmoid colon.                           - Blood in the sigmoid colon.                           - Internal  hemorrhoids. Recommendation:           - Patient has a contact number available for                            emergencies. The signs and symptoms of potential                            delayed complications were discussed with the                            patient. Return to normal activities tomorrow.                            Written discharge instructions were provided to the                            patient.                           - Resume previous diet.                           - Continue present medications.                           - Await pathology results.                           - Repeat colonoscopy in 5-10 years for surveillance  based on pathology results.                           - Return to my office in 3 months. Procedure Code(s):        --- Professional ---                           [phone], Colonoscopy, flexible; with biopsy, single                            or multiple Diagnosis Code(s):        --- Professional ---                           K64.8, Other hemorrhoids                           K63.5, Polyp of colon                           K92.2, Gastrointestinal hemorrhage, unspecified                           K62.5, Hemorrhage of anus and rectum                           K57.30, Diverticulosis of large intestine without                            perforation or abscess without bleeding CPT copyright 2019 American Medical Association. All rights reserved. The codes documented in this report are preliminary and upon coder review may  be revised to meet current compliance requirements. Otis Brace, MD Otis Brace, MD 08/28/2020 12:14:05 PM Number of Addenda: 0

## 2020-08-28 NOTE — Transfer of Care (Signed)
Immediate Anesthesia Transfer of Care Note  Patient: Kelly Phillips  Procedure(s) Performed: COLONOSCOPY (N/A ) POLYPECTOMY  Patient Location: PACU  Anesthesia Type:MAC  Level of Consciousness: awake, alert  and oriented  Airway & Oxygen Therapy: Patient Spontanous Breathing and Patient connected to nasal cannula oxygen  Post-op Assessment: Report given to RN and Post -op Vital signs reviewed and stable  Post vital signs: Reviewed and stable  Last Vitals:  Vitals Value Taken Time  BP    Temp    Pulse    Resp    SpO2      Last Pain:  Vitals:   08/28/20 1036  TempSrc: Oral  PainSc: 0-No pain         Complications: No complications documented.

## 2020-08-28 NOTE — Anesthesia Preprocedure Evaluation (Signed)
Anesthesia Evaluation  Patient identified by MRN, date of birth, ID band Patient awake    Reviewed: Allergy & Precautions, NPO status , Patient's Chart, lab work & pertinent test results  History of Anesthesia Complications Negative for: history of anesthetic complications  Airway Mallampati: II  TM Distance: >3 FB Neck ROM: Full    Dental   Pulmonary COPD,  oxygen dependent, former smoker,    Pulmonary exam normal        Cardiovascular hypertension, + Peripheral Vascular Disease  Normal cardiovascular exam     Neuro/Psych B/l carotid stenosis negative psych ROS   GI/Hepatic Neg liver ROS, PUD, GERD  ,GI bleed   Endo/Other  negative endocrine ROS  Renal/GU negative Renal ROS  negative genitourinary   Musculoskeletal negative musculoskeletal ROS (+)   Abdominal   Peds  Hematology  (+) anemia ,   Anesthesia Other Findings   Reproductive/Obstetrics                             Anesthesia Physical Anesthesia Plan  ASA: IV  Anesthesia Plan: MAC   Post-op Pain Management:    Induction: Intravenous  PONV Risk Score and Plan: 2 and Propofol infusion, TIVA and Treatment may vary due to age or medical condition  Airway Management Planned: Natural Airway, Nasal Cannula and Simple Face Mask  Additional Equipment: None  Intra-op Plan:   Post-operative Plan:   Informed Consent: I have reviewed the patients History and Physical, chart, labs and discussed the procedure including the risks, benefits and alternatives for the proposed anesthesia with the patient or authorized representative who has indicated his/her understanding and acceptance.   Patient has DNR.  Discussed DNR with patient and Suspend DNR.     Plan Discussed with:   Anesthesia Plan Comments:         Anesthesia Quick Evaluation

## 2020-08-28 NOTE — Anesthesia Postprocedure Evaluation (Signed)
Anesthesia Post Note  Patient: Kelly Phillips  Procedure(s) Performed: COLONOSCOPY (N/A ) POLYPECTOMY     Patient location during evaluation: Endoscopy Anesthesia Type: MAC Level of consciousness: awake and alert Pain management: pain level controlled Vital Signs Assessment: post-procedure vital signs reviewed and stable Respiratory status: spontaneous breathing, nonlabored ventilation and respiratory function stable Cardiovascular status: blood pressure returned to baseline and stable Postop Assessment: no apparent nausea or vomiting Anesthetic complications: no   No complications documented.  Last Vitals:  Vitals:   08/28/20 1231 08/28/20 1436  BP: 140/75 (!) 154/93  Pulse: 88 96  Resp: 15 18  Temp:  36.4 C  SpO2: 100% 100%    Last Pain:  Vitals:   08/28/20 1436  TempSrc: Oral  PainSc:                  Lidia Collum

## 2020-08-28 NOTE — Discharge Summary (Signed)
Physician Discharge Summary  Kelly Phillips [id number] DOB: November 22, 1945 DOA: 08/26/2020  PCP: Emmaline Kluver, MD  Admit date: 08/26/2020 Discharge date: 08/28/2020  Admitted From: Home  Disposition:  Home   Recommendations for Outpatient Follow-up:  1. Follow up with PCP in 1-2 weeks 2. Follow up with GI as directed 3. Dr. Venetia Maxon: Please obtain CBC in 1 week     Home Health: None  Equipment/Devices: None new  Discharge Condition: Fair  CODE STATUS: DNR Diet recommendation: Regular  Brief/Interim Summary: Kelly Phillips is a 75 y.o. F with COPD on home O2, PVD and PUD with clipping who presented with maroon stools.  Began to have maroon-colored stools with blood clots on several days ago.  This was continued until she came to the ER.  The ER, had a large maroon-colored stool witnessed by staff.  Hemoglobin initially normal, case discussed with GI who recommended observation.       PRINCIPAL HOSPITAL DIAGNOSIS: Diverticular hemorrhage    Discharge Diagnoses:   Diverticular hemorrhage Patient admitted and monitored.  Mild drop in hemoglobin, stools with some residual blood.    GI consulted, performed colonoscopy that showed some scant blood regiments, diverticular disease, no active bleeding, one polyp which was resected.     COPD without exacerbation Chronic hypoxic respiratory failure Continue home LABA/LAMA  Hypertension PVD Continue benazepril, amlodipine.  Okay to resume aspirin, Pletal.  Anxiety            Discharge Instructions  Discharge Instructions    Diet - low sodium heart healthy   Complete by: As directed    Discharge instructions   Complete by: As directed    From Dr. Loleta Books: You were admitted with rectal bleeding. This was likely from a diverticular bleed. These stop by themselves and require no treatment.  You may resume your aspirin 81 mg and cilostazol this week  You should go see your primary care doctor  in 1 week and have them check your labs  Stop taking omeprazole if you have it Take pantoprazole/Protonix 40 mg once daily (this is similar but better to omeprazole) Take this from now on or until your gastroenterologist tells you to stop Follow up with your gastroenterologist in 4-6 weeks   Increase activity slowly   Complete by: As directed      Allergies as of 08/28/2020      Reactions   Codeine Swelling   Facial swelling   Salmon [fish Allergy] Anaphylaxis, Swelling   Penicillins Nausea And Vomiting   Aspirin Tinitus      Medication List    STOP taking these medications   omeprazole 40 MG capsule Commonly known as: PRILOSEC     TAKE these medications   ALPRAZolam 0.5 MG tablet Commonly known as: XANAX Take 0.25-0.5 mg by mouth See admin instructions. Take 1/2 tablet (0.25 mg) by mouth every morning and 1 tablet (0.5 mg) at night   amLODipine-benazepril 5-10 MG capsule Commonly known as: LOTREL Take 1 capsule by mouth at bedtime.   aspirin EC 81 MG tablet Take 81 mg by mouth at bedtime. Swallow whole.   baclofen 20 MG tablet Commonly known as: LIORESAL Take 10 mg by mouth 2 (two) times daily as needed for muscle spasms.   cilostazol 100 MG tablet Commonly known as: PLETAL Take 100 mg by mouth 2 (two) times daily.   cyanocobalamin 1000 MCG/ML injection Commonly known as: (VITAMIN B-12) Inject 1,000 mcg into the muscle every 30 (thirty) days.   docusate sodium 100  MG capsule Commonly known as: COLACE Take 100-200 mg by mouth See admin instructions. Take one capsule (100 mg) by mouth every other night at bedtime, take two capsules (200 mg) on alternate nights   fluticasone 110 MCG/ACT inhaler Commonly known as: FLOVENT HFA Inhale 2 puffs into the lungs daily.   gabapentin 300 MG capsule Commonly known as: NEURONTIN Take 300-600 mg by mouth See admin instructions. Take one capsule (300 mg) by mouth after lunch and two capsules (600 mg) at night    OXYGEN Inhale 3 L into the lungs continuous.   pantoprazole 40 MG tablet Commonly known as: Protonix Take 1 tablet (40 mg total) by mouth daily.   polyethylene glycol 17 g packet Commonly known as: MIRALAX / GLYCOLAX Take 17 g by mouth daily as needed for mild constipation or moderate constipation.   Stiolto Respimat 2.5-2.5 MCG/ACT Aers Generic drug: Tiotropium Bromide-Olodaterol Inhale 2 puffs into the lungs daily.   Vitamin D-3 125 MCG (5000 UT) Tabs Take 5,000 Units by mouth at bedtime.       Allergies  Allergen Reactions  . Codeine Swelling    Facial swelling  . Salmon [Fish Allergy] Anaphylaxis and Swelling  . Penicillins Nausea And Vomiting  . Aspirin Tinitus    Consultations:  GI, Dr. Levora Angel   Procedures/Studies: CT ABDOMEN PELVIS W CONTRAST  Result Date: 08/26/2020 CLINICAL DATA:  Bloody stool EXAM: CT ABDOMEN AND PELVIS WITH CONTRAST TECHNIQUE: Multidetector CT imaging of the abdomen and pelvis was performed using the standard protocol following bolus administration of intravenous contrast. CONTRAST:  OMNIPAQUE IOHEXOL 300 MG/ML  SOLN COMPARISON:  CT 07/21/2020, 07/26/2019 FINDINGS: Lower chest: Lung bases demonstrate emphysema. Atelectasis or scar at the lingula. No acute consolidation or effusion. Hepatobiliary: No calcified gallstone. No focal hepatic abnormality. No biliary dilatation. Pancreas: Unremarkable. No pancreatic ductal dilatation or surrounding inflammatory changes. Spleen: Normal in size without focal abnormality. Adrenals/Urinary Tract: Adrenal glands are normal. Kidneys show no hydronephrosis. Scarring at the lower pole of right kidney. The bladder is unremarkable Stomach/Bowel: The stomach is nonenlarged. No dilated small bowel. Extensive sigmoid colon diverticular disease without acute inflammatory change. Vascular/Lymphatic: Advanced aortic atherosclerosis. Stent within the proximal superior mesenteric artery. Patient is status post  aortobifemoral bypass with patency of the bypass. Calcified and chronically occluded native aorta and iliac vessels. No suspicious adenopathy Reproductive: Status post hysterectomy. No adnexal masses. Other: Negative for free air or free fluid. Diastasis of the rectus with anterior bulging of mesenteric fat and colon. Musculoskeletal: No acute or suspicious osseous abnormality. Mild chronic superior endplate deformity at T12. IMPRESSION: 1. No CT evidence for acute intra-abdominal or pelvic abnormality. 2. Extensive sigmoid colon diverticular disease without acute inflammatory change. 3. Status post aortobifemoral bypass with patency. 4. Emphysema and aortic atherosclerosis. Aortic Atherosclerosis (ICD10-I70.0) and Emphysema (ICD10-J43.9). Electronically Signed   By: Jasmine Pang M.D.   On: 08/26/2020 18:56       Subjective: No new dizziness, fever, confusion.  Discharge Exam: Vitals:   08/28/20 1231 08/28/20 1436  BP: 140/75 (!) 154/93  Pulse: 88 96  Resp: 15 18  Temp:  97.6 F (36.4 C)  SpO2: 100% 100%   Vitals:   08/28/20 1215 08/28/20 1221 08/28/20 1231 08/28/20 1436  BP: (!) 103/58 130/84 140/75 (!) 154/93  Pulse: 90 92 88 96  Resp: 17 19 15 18   Temp: 97.9 F (36.6 C)   97.6 F (36.4 C)  TempSrc: Temporal   Oral  SpO2: 100% 100% 100% 100%  General: Pt is alert, awake, not in acute distress Cardiovascular: RRR, nl S1-S2, no murmurs appreciated.   No LE edema.   Respiratory: Normal respiratory rate and rhythm.  CTAB without rales or wheezes. Abdominal: Abdomen soft and non-tender.  No distension or HSM.   Neuro/Psych: Strength symmetric in upper and lower extremities.  Judgment and insight appear normal.   The results of significant diagnostics from this hospitalization (including imaging, microbiology, ancillary and laboratory) are listed below for reference.     Microbiology: Recent Results (from the past 240 hour(s))  Resp Panel by RT-PCR (Flu A&B, Covid)  Nasopharyngeal Swab     Status: None   Collection Time: 08/26/20 10:12 PM   Specimen: Nasopharyngeal Swab; Nasopharyngeal(NP) swabs in vial transport medium  Result Value Ref Range Status   SARS Coronavirus 2 by RT PCR NEGATIVE NEGATIVE Final    Comment: (NOTE) SARS-CoV-2 target nucleic acids are NOT DETECTED.  The SARS-CoV-2 RNA is generally detectable in upper respiratory specimens during the acute phase of infection. The lowest concentration of SARS-CoV-2 viral copies this assay can detect is 138 copies/mL. A negative result does not preclude SARS-Cov-2 infection and should not be used as the sole basis for treatment or other patient management decisions. A negative result may occur with  improper specimen collection/handling, submission of specimen other than nasopharyngeal swab, presence of viral mutation(s) within the areas targeted by this assay, and inadequate number of viral copies(<138 copies/mL). A negative result must be combined with clinical observations, patient history, and epidemiological information. The expected result is Negative.  Fact Sheet for Patients:  EntrepreneurPulse.com.au  Fact Sheet for Healthcare Providers:  IncredibleEmployment.be  This test is no t yet approved or cleared by the [place] FDA and  has been authorized for detection and/or diagnosis of SARS-CoV-2 by FDA under an Emergency Use Authorization (EUA). This EUA will remain  in effect (meaning this test can be used) for the duration of the COVID-19 declaration under Section 564(b)(1) of the Act, 21 U.S.C.section 360bbb-3(b)(1), unless the authorization is terminated  or revoked sooner.       Influenza A by PCR NEGATIVE NEGATIVE Final   Influenza B by PCR NEGATIVE NEGATIVE Final    Comment: (NOTE) The Xpert Xpress SARS-CoV-2/FLU/RSV plus assay is intended as an aid in the diagnosis of influenza from Nasopharyngeal swab specimens and should not be  used as a sole basis for treatment. Nasal washings and aspirates are unacceptable for Xpert Xpress SARS-CoV-2/FLU/RSV testing.  Fact Sheet for Patients: EntrepreneurPulse.com.au  Fact Sheet for Healthcare Providers: IncredibleEmployment.be  This test is not yet approved or cleared by the [place] FDA and has been authorized for detection and/or diagnosis of SARS-CoV-2 by FDA under an Emergency Use Authorization (EUA). This EUA will remain in effect (meaning this test can be used) for the duration of the COVID-19 declaration under Section 564(b)(1) of the Act, 21 U.S.C. section 360bbb-3(b)(1), unless the authorization is terminated or revoked.  Performed at [facility], [place] [address] [place], [place] [address]      Labs: BNP (last 3 results) No results for input(s): BNP in the last 8760 hours. Basic Metabolic Panel: Recent Labs  Lab 08/26/20 1235 08/27/20 0417  NA 136 136  K 4.6 4.1  CL 98 104  CO2 26 24  GLUCOSE 98 68*  BUN 13 10  CREATININE 0.90 0.89  CALCIUM 9.8 8.9  MG  --  1.7   Liver Function Tests: Recent Labs  Lab 08/26/20 1235 08/27/20  0417  AST 28 22  ALT 16 14  ALKPHOS 79 60  BILITOT 0.9 1.3*  PROT 7.8 6.3*  ALBUMIN 4.3 3.5   No results for input(s): LIPASE, AMYLASE in the last 168 hours. No results for input(s): AMMONIA in the last 168 hours. CBC: Recent Labs  Lab 08/26/20 1749 08/26/20 2239 08/27/20 0417 08/27/20 1327 08/28/20 0045  WBC 6.4 7.5 6.8 5.9 5.6  NEUTROABS 5.2  --   --   --   --   HGB 13.4 13.0 11.4* 10.3* 10.9*  HCT 42.7 42.3 37.0 32.4* 33.5*  MCV 101.9* 102.4* 101.9* 101.6* 100.0  PLT 185 201 184 166 177   Cardiac Enzymes: No results for input(s): CKTOTAL, CKMB, CKMBINDEX, TROPONINI in the last 168 hours. BNP: Invalid input(s): POCBNP CBG: No results for input(s): GLUCAP in the last 168 hours. D-Dimer No results for input(s): DDIMER in the last 72 hours. Hgb A1c No  results for input(s): HGBA1C in the last 72 hours. Lipid Profile No results for input(s): CHOL, HDL, LDLCALC, TRIG, CHOLHDL, LDLDIRECT in the last 72 hours. Thyroid function studies No results for input(s): TSH, T4TOTAL, T3FREE, THYROIDAB in the last 72 hours.  Invalid input(s): FREET3 Anemia work up Recent Labs    08/26/20 2239  RETICCTPCT 2.9   Urinalysis    Component Value Date/Time   COLORURINE YELLOW 05/23/2015 [place] 05/23/2015 1521   LABSPEC 1.006 05/23/2015 1521   PHURINE 8.5 (H) 05/23/2015 1521   GLUCOSEU NEGATIVE 05/23/2015 1521   HGBUR TRACE (A) 05/23/2015 1521   BILIRUBINUR NEGATIVE 05/23/2015 1521   KETONESUR NEGATIVE 05/23/2015 1521   PROTEINUR NEGATIVE 05/23/2015 1521   UROBILINOGEN 0.2 05/23/2015 1521   NITRITE NEGATIVE 05/23/2015 1521   LEUKOCYTESUR LARGE (A) 05/23/2015 1521   Sepsis Labs Invalid input(s): PROCALCITONIN,  WBC,  LACTICIDVEN Microbiology Recent Results (from the past 240 hour(s))  Resp Panel by RT-PCR (Flu A&B, Covid) Nasopharyngeal Swab     Status: None   Collection Time: 08/26/20 10:12 PM   Specimen: Nasopharyngeal Swab; Nasopharyngeal(NP) swabs in vial transport medium  Result Value Ref Range Status   SARS Coronavirus 2 by RT PCR NEGATIVE NEGATIVE Final    Comment: (NOTE) SARS-CoV-2 target nucleic acids are NOT DETECTED.  The SARS-CoV-2 RNA is generally detectable in upper respiratory specimens during the acute phase of infection. The lowest concentration of SARS-CoV-2 viral copies this assay can detect is 138 copies/mL. A negative result does not preclude SARS-Cov-2 infection and should not be used as the sole basis for treatment or other patient management decisions. A negative result may occur with  improper specimen collection/handling, submission of specimen other than nasopharyngeal swab, presence of viral mutation(s) within the areas targeted by this assay, and inadequate number of viral copies(<138  copies/mL). A negative result must be combined with clinical observations, patient history, and epidemiological information. The expected result is Negative.  Fact Sheet for Patients:  EntrepreneurPulse.com.au  Fact Sheet for Healthcare Providers:  IncredibleEmployment.be  This test is no t yet approved or cleared by the [place] FDA and  has been authorized for detection and/or diagnosis of SARS-CoV-2 by FDA under an Emergency Use Authorization (EUA). This EUA will remain  in effect (meaning this test can be used) for the duration of the COVID-19 declaration under Section 564(b)(1) of the Act, 21 U.S.C.section 360bbb-3(b)(1), unless the authorization is terminated  or revoked sooner.       Influenza A by PCR NEGATIVE NEGATIVE Final   Influenza B by  PCR NEGATIVE NEGATIVE Final    Comment: (NOTE) The Xpert Xpress SARS-CoV-2/FLU/RSV plus assay is intended as an aid in the diagnosis of influenza from Nasopharyngeal swab specimens and should not be used as a sole basis for treatment. Nasal washings and aspirates are unacceptable for Xpert Xpress SARS-CoV-2/FLU/RSV testing.  Fact Sheet for Patients: EntrepreneurPulse.com.au  Fact Sheet for Healthcare Providers: IncredibleEmployment.be  This test is not yet approved or cleared by the [place] FDA and has been authorized for detection and/or diagnosis of SARS-CoV-2 by FDA under an Emergency Use Authorization (EUA). This EUA will remain in effect (meaning this test can be used) for the duration of the COVID-19 declaration under Section 564(b)(1) of the Act, 21 U.S.C. section 360bbb-3(b)(1), unless the authorization is terminated or revoked.  Performed at [facility], [place] [address] [place], [place] [address]      Time coordinating discharge: 25 minutes       SIGNED:   Edwin Dada, MD  [facility] 08/28/2020, 6:23  PM

## 2020-08-28 NOTE — Interval H&P Note (Signed)
History and Physical Interval Note:  08/28/2020 10:45 AM  Kelly Phillips  has presented today for surgery, with the diagnosis of hematochezia.  The various methods of treatment have been discussed with the patient and family. After consideration of risks, benefits and other options for treatment, the patient has consented to  Procedure(s): COLONOSCOPY (N/A) as a surgical intervention.  The patient's history has been reviewed, patient examined, no change in status, stable for surgery.  I have reviewed the patient's chart and labs.  Questions were answered to the patient's satisfaction.     Onnie Hatchel

## 2020-08-28 NOTE — Anesthesia Procedure Notes (Signed)
Procedure Name: MAC Date/Time: 08/28/2020 11:39 AM Performed by: Gwyndolyn Saxon, CRNA Pre-anesthesia Checklist: Patient identified, Emergency Drugs available, Suction available, Patient being monitored and Timeout performed Patient Re-evaluated:Patient Re-evaluated prior to induction Oxygen Delivery Method: Nasal cannula

## 2020-08-29 LAB — SURGICAL PATHOLOGY

## 2020-09-07 DIAGNOSIS — K277 Chronic peptic ulcer, site unspecified, without hemorrhage or perforation: Secondary | ICD-10-CM | POA: Diagnosis not present

## 2020-09-07 DIAGNOSIS — K5731 Diverticulosis of large intestine without perforation or abscess with bleeding: Secondary | ICD-10-CM | POA: Diagnosis not present

## 2020-09-07 DIAGNOSIS — D500 Iron deficiency anemia secondary to blood loss (chronic): Secondary | ICD-10-CM | POA: Diagnosis not present

## 2020-09-07 DIAGNOSIS — K2950 Unspecified chronic gastritis without bleeding: Secondary | ICD-10-CM | POA: Diagnosis not present

## 2020-09-07 DIAGNOSIS — E538 Deficiency of other specified B group vitamins: Secondary | ICD-10-CM | POA: Diagnosis not present

## 2020-09-19 DIAGNOSIS — R7302 Impaired glucose tolerance (oral): Secondary | ICD-10-CM | POA: Diagnosis not present

## 2020-09-19 DIAGNOSIS — D500 Iron deficiency anemia secondary to blood loss (chronic): Secondary | ICD-10-CM | POA: Diagnosis not present

## 2020-09-19 DIAGNOSIS — E785 Hyperlipidemia, unspecified: Secondary | ICD-10-CM | POA: Diagnosis not present

## 2020-09-19 DIAGNOSIS — Z79899 Other long term (current) drug therapy: Secondary | ICD-10-CM | POA: Diagnosis not present

## 2020-10-04 DIAGNOSIS — K277 Chronic peptic ulcer, site unspecified, without hemorrhage or perforation: Secondary | ICD-10-CM | POA: Diagnosis not present

## 2020-10-04 DIAGNOSIS — D500 Iron deficiency anemia secondary to blood loss (chronic): Secondary | ICD-10-CM | POA: Diagnosis not present

## 2020-10-04 DIAGNOSIS — Z0000 Encounter for general adult medical examination without abnormal findings: Secondary | ICD-10-CM | POA: Diagnosis not present

## 2020-10-04 DIAGNOSIS — K2950 Unspecified chronic gastritis without bleeding: Secondary | ICD-10-CM | POA: Diagnosis not present

## 2020-10-04 DIAGNOSIS — K5731 Diverticulosis of large intestine without perforation or abscess with bleeding: Secondary | ICD-10-CM | POA: Diagnosis not present

## 2020-10-17 DIAGNOSIS — D519 Vitamin B12 deficiency anemia, unspecified: Secondary | ICD-10-CM | POA: Diagnosis not present

## 2020-10-30 DIAGNOSIS — M5441 Lumbago with sciatica, right side: Secondary | ICD-10-CM | POA: Diagnosis not present

## 2020-10-30 DIAGNOSIS — R200 Anesthesia of skin: Secondary | ICD-10-CM | POA: Diagnosis not present

## 2020-10-30 DIAGNOSIS — R202 Paresthesia of skin: Secondary | ICD-10-CM | POA: Diagnosis not present

## 2020-10-30 DIAGNOSIS — I70213 Atherosclerosis of native arteries of extremities with intermittent claudication, bilateral legs: Secondary | ICD-10-CM | POA: Diagnosis not present

## 2020-10-30 DIAGNOSIS — G8929 Other chronic pain: Secondary | ICD-10-CM | POA: Diagnosis not present

## 2020-10-30 DIAGNOSIS — I739 Peripheral vascular disease, unspecified: Secondary | ICD-10-CM | POA: Diagnosis not present

## 2020-10-30 DIAGNOSIS — M5442 Lumbago with sciatica, left side: Secondary | ICD-10-CM | POA: Diagnosis not present

## 2020-11-01 DIAGNOSIS — J449 Chronic obstructive pulmonary disease, unspecified: Secondary | ICD-10-CM | POA: Diagnosis not present

## 2020-11-01 DIAGNOSIS — R0902 Hypoxemia: Secondary | ICD-10-CM | POA: Diagnosis not present

## 2020-11-01 DIAGNOSIS — Z87891 Personal history of nicotine dependence: Secondary | ICD-10-CM | POA: Diagnosis not present

## 2020-11-12 DIAGNOSIS — M5416 Radiculopathy, lumbar region: Secondary | ICD-10-CM | POA: Diagnosis not present

## 2020-11-12 DIAGNOSIS — M47896 Other spondylosis, lumbar region: Secondary | ICD-10-CM | POA: Diagnosis not present

## 2020-11-12 DIAGNOSIS — Z6826 Body mass index (BMI) 26.0-26.9, adult: Secondary | ICD-10-CM | POA: Diagnosis not present

## 2020-11-12 DIAGNOSIS — M545 Low back pain, unspecified: Secondary | ICD-10-CM | POA: Diagnosis not present

## 2020-11-12 DIAGNOSIS — M48062 Spinal stenosis, lumbar region with neurogenic claudication: Secondary | ICD-10-CM | POA: Diagnosis not present

## 2020-11-20 DIAGNOSIS — Z961 Presence of intraocular lens: Secondary | ICD-10-CM | POA: Diagnosis not present

## 2020-11-20 DIAGNOSIS — H353190 Nonexudative age-related macular degeneration, unspecified eye, stage unspecified: Secondary | ICD-10-CM | POA: Diagnosis not present

## 2020-11-26 DIAGNOSIS — M5416 Radiculopathy, lumbar region: Secondary | ICD-10-CM | POA: Diagnosis not present

## 2020-11-26 DIAGNOSIS — M545 Low back pain, unspecified: Secondary | ICD-10-CM | POA: Diagnosis not present

## 2020-12-17 DIAGNOSIS — M47896 Other spondylosis, lumbar region: Secondary | ICD-10-CM | POA: Diagnosis not present

## 2020-12-17 DIAGNOSIS — M545 Low back pain, unspecified: Secondary | ICD-10-CM | POA: Diagnosis not present

## 2020-12-17 DIAGNOSIS — M5416 Radiculopathy, lumbar region: Secondary | ICD-10-CM | POA: Diagnosis not present

## 2020-12-17 DIAGNOSIS — M48062 Spinal stenosis, lumbar region with neurogenic claudication: Secondary | ICD-10-CM | POA: Diagnosis not present

## 2020-12-28 DIAGNOSIS — M546 Pain in thoracic spine: Secondary | ICD-10-CM | POA: Diagnosis not present

## 2021-01-17 DIAGNOSIS — Z6826 Body mass index (BMI) 26.0-26.9, adult: Secondary | ICD-10-CM | POA: Diagnosis not present

## 2021-01-17 DIAGNOSIS — M546 Pain in thoracic spine: Secondary | ICD-10-CM | POA: Diagnosis not present

## 2021-01-17 DIAGNOSIS — M545 Low back pain, unspecified: Secondary | ICD-10-CM | POA: Diagnosis not present

## 2021-01-17 DIAGNOSIS — M47896 Other spondylosis, lumbar region: Secondary | ICD-10-CM | POA: Diagnosis not present

## 2021-01-25 DIAGNOSIS — Z79899 Other long term (current) drug therapy: Secondary | ICD-10-CM | POA: Diagnosis not present

## 2021-01-25 DIAGNOSIS — D500 Iron deficiency anemia secondary to blood loss (chronic): Secondary | ICD-10-CM | POA: Diagnosis not present

## 2021-01-25 DIAGNOSIS — G629 Polyneuropathy, unspecified: Secondary | ICD-10-CM | POA: Diagnosis not present

## 2021-01-25 DIAGNOSIS — M62830 Muscle spasm of back: Secondary | ICD-10-CM | POA: Diagnosis not present

## 2021-01-25 DIAGNOSIS — M47819 Spondylosis without myelopathy or radiculopathy, site unspecified: Secondary | ICD-10-CM | POA: Diagnosis not present

## 2021-01-25 DIAGNOSIS — E559 Vitamin D deficiency, unspecified: Secondary | ICD-10-CM | POA: Diagnosis not present

## 2021-01-25 DIAGNOSIS — R7302 Impaired glucose tolerance (oral): Secondary | ICD-10-CM | POA: Diagnosis not present

## 2021-01-25 DIAGNOSIS — K2950 Unspecified chronic gastritis without bleeding: Secondary | ICD-10-CM | POA: Diagnosis not present

## 2021-01-30 DIAGNOSIS — D500 Iron deficiency anemia secondary to blood loss (chronic): Secondary | ICD-10-CM | POA: Diagnosis not present

## 2021-02-14 DIAGNOSIS — J9611 Chronic respiratory failure with hypoxia: Secondary | ICD-10-CM | POA: Diagnosis not present

## 2021-02-14 DIAGNOSIS — J449 Chronic obstructive pulmonary disease, unspecified: Secondary | ICD-10-CM | POA: Diagnosis not present

## 2021-03-01 DIAGNOSIS — E538 Deficiency of other specified B group vitamins: Secondary | ICD-10-CM | POA: Diagnosis not present

## 2021-03-01 DIAGNOSIS — M47819 Spondylosis without myelopathy or radiculopathy, site unspecified: Secondary | ICD-10-CM | POA: Diagnosis not present

## 2021-03-01 DIAGNOSIS — G63 Polyneuropathy in diseases classified elsewhere: Secondary | ICD-10-CM | POA: Diagnosis not present

## 2021-03-01 DIAGNOSIS — J432 Centrilobular emphysema: Secondary | ICD-10-CM | POA: Diagnosis not present

## 2021-04-03 DIAGNOSIS — N390 Urinary tract infection, site not specified: Secondary | ICD-10-CM | POA: Diagnosis not present

## 2021-04-04 DIAGNOSIS — M792 Neuralgia and neuritis, unspecified: Secondary | ICD-10-CM | POA: Diagnosis not present

## 2021-04-16 DIAGNOSIS — R351 Nocturia: Secondary | ICD-10-CM | POA: Diagnosis not present

## 2021-04-16 DIAGNOSIS — Z79899 Other long term (current) drug therapy: Secondary | ICD-10-CM | POA: Diagnosis not present

## 2021-04-16 DIAGNOSIS — N390 Urinary tract infection, site not specified: Secondary | ICD-10-CM | POA: Diagnosis not present

## 2021-04-16 DIAGNOSIS — R339 Retention of urine, unspecified: Secondary | ICD-10-CM | POA: Diagnosis not present

## 2021-04-19 DIAGNOSIS — N390 Urinary tract infection, site not specified: Secondary | ICD-10-CM | POA: Diagnosis not present

## 2021-04-24 DIAGNOSIS — N3010 Interstitial cystitis (chronic) without hematuria: Secondary | ICD-10-CM | POA: Diagnosis not present

## 2021-04-24 DIAGNOSIS — Z6824 Body mass index (BMI) 24.0-24.9, adult: Secondary | ICD-10-CM | POA: Diagnosis not present

## 2021-04-24 DIAGNOSIS — G63 Polyneuropathy in diseases classified elsewhere: Secondary | ICD-10-CM | POA: Diagnosis not present

## 2021-04-24 DIAGNOSIS — N952 Postmenopausal atrophic vaginitis: Secondary | ICD-10-CM | POA: Diagnosis not present

## 2021-06-05 DIAGNOSIS — M5414 Radiculopathy, thoracic region: Secondary | ICD-10-CM | POA: Diagnosis not present

## 2021-06-05 DIAGNOSIS — G629 Polyneuropathy, unspecified: Secondary | ICD-10-CM | POA: Diagnosis not present

## 2021-06-05 DIAGNOSIS — R200 Anesthesia of skin: Secondary | ICD-10-CM | POA: Diagnosis not present

## 2021-06-14 DIAGNOSIS — I700 Atherosclerosis of aorta: Secondary | ICD-10-CM | POA: Diagnosis not present

## 2021-06-14 DIAGNOSIS — R918 Other nonspecific abnormal finding of lung field: Secondary | ICD-10-CM | POA: Diagnosis not present

## 2021-06-14 DIAGNOSIS — R911 Solitary pulmonary nodule: Secondary | ICD-10-CM | POA: Diagnosis not present

## 2021-06-14 DIAGNOSIS — J439 Emphysema, unspecified: Secondary | ICD-10-CM | POA: Diagnosis not present

## 2021-06-28 DIAGNOSIS — M5134 Other intervertebral disc degeneration, thoracic region: Secondary | ICD-10-CM | POA: Diagnosis not present

## 2021-06-28 DIAGNOSIS — M546 Pain in thoracic spine: Secondary | ICD-10-CM | POA: Diagnosis not present

## 2021-06-28 DIAGNOSIS — M5414 Radiculopathy, thoracic region: Secondary | ICD-10-CM | POA: Diagnosis not present

## 2021-07-04 DIAGNOSIS — M5412 Radiculopathy, cervical region: Secondary | ICD-10-CM | POA: Diagnosis not present

## 2021-07-04 DIAGNOSIS — G629 Polyneuropathy, unspecified: Secondary | ICD-10-CM | POA: Diagnosis not present

## 2021-07-12 DIAGNOSIS — J449 Chronic obstructive pulmonary disease, unspecified: Secondary | ICD-10-CM | POA: Diagnosis not present

## 2021-07-12 DIAGNOSIS — R911 Solitary pulmonary nodule: Secondary | ICD-10-CM | POA: Diagnosis not present

## 2021-07-12 DIAGNOSIS — J9611 Chronic respiratory failure with hypoxia: Secondary | ICD-10-CM | POA: Diagnosis not present

## 2021-08-08 DIAGNOSIS — M5412 Radiculopathy, cervical region: Secondary | ICD-10-CM | POA: Diagnosis not present

## 2021-09-20 DIAGNOSIS — J9611 Chronic respiratory failure with hypoxia: Secondary | ICD-10-CM | POA: Diagnosis not present

## 2021-09-20 DIAGNOSIS — J449 Chronic obstructive pulmonary disease, unspecified: Secondary | ICD-10-CM | POA: Diagnosis not present

## 2021-09-20 DIAGNOSIS — R911 Solitary pulmonary nodule: Secondary | ICD-10-CM | POA: Diagnosis not present

## 2021-09-20 DIAGNOSIS — J441 Chronic obstructive pulmonary disease with (acute) exacerbation: Secondary | ICD-10-CM | POA: Diagnosis not present

## 2021-10-02 DIAGNOSIS — R918 Other nonspecific abnormal finding of lung field: Secondary | ICD-10-CM | POA: Diagnosis not present

## 2021-10-02 DIAGNOSIS — H353231 Exudative age-related macular degeneration, bilateral, with active choroidal neovascularization: Secondary | ICD-10-CM | POA: Diagnosis not present

## 2021-10-02 DIAGNOSIS — Z9981 Dependence on supplemental oxygen: Secondary | ICD-10-CM | POA: Diagnosis not present

## 2021-10-02 DIAGNOSIS — I110 Hypertensive heart disease with heart failure: Secondary | ICD-10-CM | POA: Diagnosis not present

## 2021-10-02 DIAGNOSIS — G63 Polyneuropathy in diseases classified elsewhere: Secondary | ICD-10-CM | POA: Diagnosis not present

## 2021-10-02 DIAGNOSIS — I471 Supraventricular tachycardia: Secondary | ICD-10-CM | POA: Diagnosis not present

## 2021-10-02 DIAGNOSIS — J432 Centrilobular emphysema: Secondary | ICD-10-CM | POA: Diagnosis not present

## 2021-10-02 DIAGNOSIS — I5032 Chronic diastolic (congestive) heart failure: Secondary | ICD-10-CM | POA: Diagnosis not present

## 2021-10-02 DIAGNOSIS — G629 Polyneuropathy, unspecified: Secondary | ICD-10-CM | POA: Diagnosis not present

## 2021-10-02 DIAGNOSIS — E785 Hyperlipidemia, unspecified: Secondary | ICD-10-CM | POA: Diagnosis not present

## 2021-10-02 DIAGNOSIS — Z79899 Other long term (current) drug therapy: Secondary | ICD-10-CM | POA: Diagnosis not present

## 2021-10-02 DIAGNOSIS — K2950 Unspecified chronic gastritis without bleeding: Secondary | ICD-10-CM | POA: Diagnosis not present

## 2021-10-02 DIAGNOSIS — J9611 Chronic respiratory failure with hypoxia: Secondary | ICD-10-CM | POA: Diagnosis not present

## 2021-10-08 DIAGNOSIS — Z0000 Encounter for general adult medical examination without abnormal findings: Secondary | ICD-10-CM | POA: Diagnosis not present

## 2021-10-10 DIAGNOSIS — M5412 Radiculopathy, cervical region: Secondary | ICD-10-CM | POA: Diagnosis not present

## 2021-10-20 DIAGNOSIS — J449 Chronic obstructive pulmonary disease, unspecified: Secondary | ICD-10-CM | POA: Diagnosis not present

## 2021-11-08 DIAGNOSIS — B3731 Acute candidiasis of vulva and vagina: Secondary | ICD-10-CM | POA: Diagnosis not present

## 2021-11-08 DIAGNOSIS — N390 Urinary tract infection, site not specified: Secondary | ICD-10-CM | POA: Diagnosis not present

## 2021-11-08 DIAGNOSIS — R339 Retention of urine, unspecified: Secondary | ICD-10-CM | POA: Diagnosis not present

## 2021-11-08 DIAGNOSIS — J449 Chronic obstructive pulmonary disease, unspecified: Secondary | ICD-10-CM | POA: Diagnosis not present

## 2021-11-08 DIAGNOSIS — R351 Nocturia: Secondary | ICD-10-CM | POA: Diagnosis not present

## 2021-11-17 DIAGNOSIS — J449 Chronic obstructive pulmonary disease, unspecified: Secondary | ICD-10-CM | POA: Diagnosis not present

## 2021-11-22 DIAGNOSIS — Z79899 Other long term (current) drug therapy: Secondary | ICD-10-CM | POA: Diagnosis not present

## 2021-11-22 DIAGNOSIS — E785 Hyperlipidemia, unspecified: Secondary | ICD-10-CM | POA: Diagnosis not present

## 2021-11-22 DIAGNOSIS — G629 Polyneuropathy, unspecified: Secondary | ICD-10-CM | POA: Diagnosis not present

## 2021-11-22 DIAGNOSIS — I10 Essential (primary) hypertension: Secondary | ICD-10-CM | POA: Diagnosis not present

## 2021-12-02 DIAGNOSIS — J449 Chronic obstructive pulmonary disease, unspecified: Secondary | ICD-10-CM | POA: Diagnosis not present

## 2021-12-03 DIAGNOSIS — J449 Chronic obstructive pulmonary disease, unspecified: Secondary | ICD-10-CM | POA: Diagnosis not present

## 2021-12-10 DIAGNOSIS — M5412 Radiculopathy, cervical region: Secondary | ICD-10-CM | POA: Diagnosis not present

## 2021-12-10 DIAGNOSIS — I700 Atherosclerosis of aorta: Secondary | ICD-10-CM | POA: Diagnosis not present

## 2021-12-10 DIAGNOSIS — R911 Solitary pulmonary nodule: Secondary | ICD-10-CM | POA: Diagnosis not present

## 2021-12-13 DIAGNOSIS — J449 Chronic obstructive pulmonary disease, unspecified: Secondary | ICD-10-CM | POA: Diagnosis not present

## 2021-12-13 DIAGNOSIS — J9611 Chronic respiratory failure with hypoxia: Secondary | ICD-10-CM | POA: Diagnosis not present

## 2021-12-13 DIAGNOSIS — R911 Solitary pulmonary nodule: Secondary | ICD-10-CM | POA: Diagnosis not present

## 2021-12-17 DIAGNOSIS — G629 Polyneuropathy, unspecified: Secondary | ICD-10-CM | POA: Diagnosis not present

## 2021-12-17 DIAGNOSIS — Z79899 Other long term (current) drug therapy: Secondary | ICD-10-CM | POA: Diagnosis not present

## 2021-12-23 DIAGNOSIS — I700 Atherosclerosis of aorta: Secondary | ICD-10-CM | POA: Diagnosis not present

## 2021-12-23 DIAGNOSIS — J449 Chronic obstructive pulmonary disease, unspecified: Secondary | ICD-10-CM | POA: Diagnosis not present

## 2021-12-23 DIAGNOSIS — R911 Solitary pulmonary nodule: Secondary | ICD-10-CM | POA: Diagnosis not present

## 2021-12-23 DIAGNOSIS — I2510 Atherosclerotic heart disease of native coronary artery without angina pectoris: Secondary | ICD-10-CM | POA: Diagnosis not present

## 2022-01-22 DIAGNOSIS — E785 Hyperlipidemia, unspecified: Secondary | ICD-10-CM | POA: Diagnosis not present

## 2022-01-22 DIAGNOSIS — F411 Generalized anxiety disorder: Secondary | ICD-10-CM | POA: Diagnosis not present

## 2022-01-22 DIAGNOSIS — F3340 Major depressive disorder, recurrent, in remission, unspecified: Secondary | ICD-10-CM | POA: Diagnosis not present

## 2022-01-27 DIAGNOSIS — J9611 Chronic respiratory failure with hypoxia: Secondary | ICD-10-CM | POA: Diagnosis not present

## 2022-01-27 DIAGNOSIS — J441 Chronic obstructive pulmonary disease with (acute) exacerbation: Secondary | ICD-10-CM | POA: Diagnosis not present

## 2022-01-27 DIAGNOSIS — J449 Chronic obstructive pulmonary disease, unspecified: Secondary | ICD-10-CM | POA: Diagnosis not present

## 2022-01-29 DIAGNOSIS — J432 Centrilobular emphysema: Secondary | ICD-10-CM | POA: Diagnosis not present

## 2022-01-29 DIAGNOSIS — J441 Chronic obstructive pulmonary disease with (acute) exacerbation: Secondary | ICD-10-CM | POA: Diagnosis not present

## 2022-01-29 DIAGNOSIS — R918 Other nonspecific abnormal finding of lung field: Secondary | ICD-10-CM | POA: Diagnosis not present

## 2022-01-29 DIAGNOSIS — J9611 Chronic respiratory failure with hypoxia: Secondary | ICD-10-CM | POA: Diagnosis not present

## 2022-01-29 DIAGNOSIS — N951 Menopausal and female climacteric states: Secondary | ICD-10-CM | POA: Diagnosis not present

## 2022-01-29 DIAGNOSIS — J439 Emphysema, unspecified: Secondary | ICD-10-CM | POA: Diagnosis not present

## 2022-01-29 DIAGNOSIS — Z6824 Body mass index (BMI) 24.0-24.9, adult: Secondary | ICD-10-CM | POA: Diagnosis not present

## 2022-01-29 DIAGNOSIS — R61 Generalized hyperhidrosis: Secondary | ICD-10-CM | POA: Diagnosis not present

## 2022-01-29 DIAGNOSIS — G63 Polyneuropathy in diseases classified elsewhere: Secondary | ICD-10-CM | POA: Diagnosis not present

## 2022-02-19 DIAGNOSIS — J449 Chronic obstructive pulmonary disease, unspecified: Secondary | ICD-10-CM | POA: Diagnosis not present

## 2022-02-21 DIAGNOSIS — F3340 Major depressive disorder, recurrent, in remission, unspecified: Secondary | ICD-10-CM | POA: Diagnosis not present

## 2022-02-21 DIAGNOSIS — E785 Hyperlipidemia, unspecified: Secondary | ICD-10-CM | POA: Diagnosis not present

## 2022-02-21 DIAGNOSIS — F411 Generalized anxiety disorder: Secondary | ICD-10-CM | POA: Diagnosis not present

## 2022-02-26 DIAGNOSIS — R9389 Abnormal findings on diagnostic imaging of other specified body structures: Secondary | ICD-10-CM | POA: Diagnosis not present

## 2022-02-26 DIAGNOSIS — M792 Neuralgia and neuritis, unspecified: Secondary | ICD-10-CM | POA: Diagnosis not present

## 2022-03-14 DIAGNOSIS — J449 Chronic obstructive pulmonary disease, unspecified: Secondary | ICD-10-CM | POA: Diagnosis not present

## 2022-03-17 DIAGNOSIS — Z6823 Body mass index (BMI) 23.0-23.9, adult: Secondary | ICD-10-CM | POA: Diagnosis not present

## 2022-03-17 DIAGNOSIS — M25471 Effusion, right ankle: Secondary | ICD-10-CM | POA: Diagnosis not present

## 2022-03-24 DIAGNOSIS — E785 Hyperlipidemia, unspecified: Secondary | ICD-10-CM | POA: Diagnosis not present

## 2022-03-24 DIAGNOSIS — F411 Generalized anxiety disorder: Secondary | ICD-10-CM | POA: Diagnosis not present

## 2022-03-24 DIAGNOSIS — F3340 Major depressive disorder, recurrent, in remission, unspecified: Secondary | ICD-10-CM | POA: Diagnosis not present

## 2022-03-28 DIAGNOSIS — J439 Emphysema, unspecified: Secondary | ICD-10-CM | POA: Diagnosis not present

## 2022-03-28 DIAGNOSIS — R911 Solitary pulmonary nodule: Secondary | ICD-10-CM | POA: Diagnosis not present

## 2022-03-31 DIAGNOSIS — Z87891 Personal history of nicotine dependence: Secondary | ICD-10-CM | POA: Diagnosis not present

## 2022-03-31 DIAGNOSIS — K551 Chronic vascular disorders of intestine: Secondary | ICD-10-CM | POA: Diagnosis not present

## 2022-03-31 DIAGNOSIS — J449 Chronic obstructive pulmonary disease, unspecified: Secondary | ICD-10-CM | POA: Diagnosis not present

## 2022-03-31 DIAGNOSIS — I10 Essential (primary) hypertension: Secondary | ICD-10-CM | POA: Diagnosis not present

## 2022-03-31 DIAGNOSIS — I6523 Occlusion and stenosis of bilateral carotid arteries: Secondary | ICD-10-CM | POA: Diagnosis not present

## 2022-03-31 DIAGNOSIS — I739 Peripheral vascular disease, unspecified: Secondary | ICD-10-CM | POA: Diagnosis not present

## 2022-03-31 DIAGNOSIS — I70213 Atherosclerosis of native arteries of extremities with intermittent claudication, bilateral legs: Secondary | ICD-10-CM | POA: Diagnosis not present

## 2022-03-31 DIAGNOSIS — Z95828 Presence of other vascular implants and grafts: Secondary | ICD-10-CM | POA: Diagnosis not present

## 2022-03-31 DIAGNOSIS — I700 Atherosclerosis of aorta: Secondary | ICD-10-CM | POA: Diagnosis not present

## 2022-04-04 DIAGNOSIS — J9611 Chronic respiratory failure with hypoxia: Secondary | ICD-10-CM | POA: Diagnosis not present

## 2022-04-04 DIAGNOSIS — R911 Solitary pulmonary nodule: Secondary | ICD-10-CM | POA: Diagnosis not present

## 2022-04-04 DIAGNOSIS — J449 Chronic obstructive pulmonary disease, unspecified: Secondary | ICD-10-CM | POA: Diagnosis not present

## 2022-04-15 DIAGNOSIS — J449 Chronic obstructive pulmonary disease, unspecified: Secondary | ICD-10-CM | POA: Diagnosis not present

## 2022-04-23 DIAGNOSIS — T466X5A Adverse effect of antihyperlipidemic and antiarteriosclerotic drugs, initial encounter: Secondary | ICD-10-CM | POA: Diagnosis not present

## 2022-04-23 DIAGNOSIS — G720 Drug-induced myopathy: Secondary | ICD-10-CM | POA: Diagnosis not present

## 2022-04-23 DIAGNOSIS — I110 Hypertensive heart disease with heart failure: Secondary | ICD-10-CM | POA: Diagnosis not present

## 2022-04-23 DIAGNOSIS — E785 Hyperlipidemia, unspecified: Secondary | ICD-10-CM | POA: Diagnosis not present

## 2022-04-23 DIAGNOSIS — R7302 Impaired glucose tolerance (oral): Secondary | ICD-10-CM | POA: Diagnosis not present

## 2022-04-23 DIAGNOSIS — Z79899 Other long term (current) drug therapy: Secondary | ICD-10-CM | POA: Diagnosis not present

## 2022-04-23 DIAGNOSIS — I5032 Chronic diastolic (congestive) heart failure: Secondary | ICD-10-CM | POA: Diagnosis not present

## 2022-04-23 DIAGNOSIS — M47819 Spondylosis without myelopathy or radiculopathy, site unspecified: Secondary | ICD-10-CM | POA: Diagnosis not present

## 2022-04-23 DIAGNOSIS — G63 Polyneuropathy in diseases classified elsewhere: Secondary | ICD-10-CM | POA: Diagnosis not present

## 2022-04-24 DIAGNOSIS — E785 Hyperlipidemia, unspecified: Secondary | ICD-10-CM | POA: Diagnosis not present

## 2022-04-24 DIAGNOSIS — F3340 Major depressive disorder, recurrent, in remission, unspecified: Secondary | ICD-10-CM | POA: Diagnosis not present

## 2022-04-24 DIAGNOSIS — F411 Generalized anxiety disorder: Secondary | ICD-10-CM | POA: Diagnosis not present

## 2022-05-08 DIAGNOSIS — J449 Chronic obstructive pulmonary disease, unspecified: Secondary | ICD-10-CM | POA: Diagnosis not present

## 2022-05-24 DIAGNOSIS — E785 Hyperlipidemia, unspecified: Secondary | ICD-10-CM | POA: Diagnosis not present

## 2022-05-24 DIAGNOSIS — F3340 Major depressive disorder, recurrent, in remission, unspecified: Secondary | ICD-10-CM | POA: Diagnosis not present

## 2022-05-24 DIAGNOSIS — F411 Generalized anxiety disorder: Secondary | ICD-10-CM | POA: Diagnosis not present

## 2022-06-06 DIAGNOSIS — J449 Chronic obstructive pulmonary disease, unspecified: Secondary | ICD-10-CM | POA: Diagnosis not present

## 2022-06-11 DIAGNOSIS — J449 Chronic obstructive pulmonary disease, unspecified: Secondary | ICD-10-CM | POA: Diagnosis not present

## 2022-06-24 DIAGNOSIS — F411 Generalized anxiety disorder: Secondary | ICD-10-CM | POA: Diagnosis not present

## 2022-06-24 DIAGNOSIS — F3340 Major depressive disorder, recurrent, in remission, unspecified: Secondary | ICD-10-CM | POA: Diagnosis not present

## 2022-06-24 DIAGNOSIS — E785 Hyperlipidemia, unspecified: Secondary | ICD-10-CM | POA: Diagnosis not present

## 2022-07-03 DIAGNOSIS — J449 Chronic obstructive pulmonary disease, unspecified: Secondary | ICD-10-CM | POA: Diagnosis not present

## 2022-07-24 DIAGNOSIS — J449 Chronic obstructive pulmonary disease, unspecified: Secondary | ICD-10-CM | POA: Diagnosis not present

## 2022-07-30 DIAGNOSIS — E538 Deficiency of other specified B group vitamins: Secondary | ICD-10-CM | POA: Diagnosis not present

## 2022-07-30 DIAGNOSIS — E785 Hyperlipidemia, unspecified: Secondary | ICD-10-CM | POA: Diagnosis not present

## 2022-07-30 DIAGNOSIS — N951 Menopausal and female climacteric states: Secondary | ICD-10-CM | POA: Diagnosis not present

## 2022-07-30 DIAGNOSIS — F411 Generalized anxiety disorder: Secondary | ICD-10-CM | POA: Diagnosis not present

## 2022-07-30 DIAGNOSIS — Z8719 Personal history of other diseases of the digestive system: Secondary | ICD-10-CM | POA: Diagnosis not present

## 2022-07-30 DIAGNOSIS — D500 Iron deficiency anemia secondary to blood loss (chronic): Secondary | ICD-10-CM | POA: Diagnosis not present

## 2022-07-30 DIAGNOSIS — Z6822 Body mass index (BMI) 22.0-22.9, adult: Secondary | ICD-10-CM | POA: Diagnosis not present

## 2022-07-30 DIAGNOSIS — K2950 Unspecified chronic gastritis without bleeding: Secondary | ICD-10-CM | POA: Diagnosis not present

## 2022-07-30 DIAGNOSIS — M47819 Spondylosis without myelopathy or radiculopathy, site unspecified: Secondary | ICD-10-CM | POA: Diagnosis not present

## 2022-08-22 DIAGNOSIS — J449 Chronic obstructive pulmonary disease, unspecified: Secondary | ICD-10-CM | POA: Diagnosis not present

## 2022-08-24 DIAGNOSIS — F3340 Major depressive disorder, recurrent, in remission, unspecified: Secondary | ICD-10-CM | POA: Diagnosis not present

## 2022-08-24 DIAGNOSIS — F411 Generalized anxiety disorder: Secondary | ICD-10-CM | POA: Diagnosis not present

## 2022-08-24 DIAGNOSIS — E785 Hyperlipidemia, unspecified: Secondary | ICD-10-CM | POA: Diagnosis not present

## 2022-08-30 ENCOUNTER — Inpatient Hospital Stay (HOSPITAL_COMMUNITY)
Admission: EM | Admit: 2022-08-30 | Discharge: 2022-09-05 | DRG: 378 | Disposition: A | Discharge status: Skilled Nursing Facility | Payer: Medicare HMO | Attending: Internal Medicine | Admitting: Internal Medicine

## 2022-08-30 ENCOUNTER — Other Ambulatory Visit: Payer: Self-pay

## 2022-08-30 ENCOUNTER — Emergency Department (HOSPITAL_COMMUNITY): Payer: Medicare HMO

## 2022-08-30 ENCOUNTER — Encounter (HOSPITAL_COMMUNITY): Payer: Self-pay | Admitting: Internal Medicine

## 2022-08-30 DIAGNOSIS — Z8616 Personal history of COVID-19: Secondary | ICD-10-CM | POA: Diagnosis not present

## 2022-08-30 DIAGNOSIS — D500 Iron deficiency anemia secondary to blood loss (chronic): Secondary | ICD-10-CM | POA: Diagnosis not present

## 2022-08-30 DIAGNOSIS — Z7982 Long term (current) use of aspirin: Secondary | ICD-10-CM

## 2022-08-30 DIAGNOSIS — K222 Esophageal obstruction: Secondary | ICD-10-CM | POA: Diagnosis not present

## 2022-08-30 DIAGNOSIS — K311 Adult hypertrophic pyloric stenosis: Secondary | ICD-10-CM | POA: Diagnosis present

## 2022-08-30 DIAGNOSIS — H548 Legal blindness, as defined in USA: Secondary | ICD-10-CM | POA: Diagnosis present

## 2022-08-30 DIAGNOSIS — R053 Chronic cough: Secondary | ICD-10-CM | POA: Diagnosis not present

## 2022-08-30 DIAGNOSIS — K219 Gastro-esophageal reflux disease without esophagitis: Secondary | ICD-10-CM | POA: Diagnosis not present

## 2022-08-30 DIAGNOSIS — J439 Emphysema, unspecified: Secondary | ICD-10-CM | POA: Diagnosis present

## 2022-08-30 DIAGNOSIS — H3530 Unspecified macular degeneration: Secondary | ICD-10-CM | POA: Diagnosis not present

## 2022-08-30 DIAGNOSIS — R1111 Vomiting without nausea: Secondary | ICD-10-CM | POA: Diagnosis not present

## 2022-08-30 DIAGNOSIS — J449 Chronic obstructive pulmonary disease, unspecified: Secondary | ICD-10-CM | POA: Diagnosis not present

## 2022-08-30 DIAGNOSIS — K449 Diaphragmatic hernia without obstruction or gangrene: Secondary | ICD-10-CM | POA: Diagnosis present

## 2022-08-30 DIAGNOSIS — Z91013 Allergy to seafood: Secondary | ICD-10-CM

## 2022-08-30 DIAGNOSIS — I2510 Atherosclerotic heart disease of native coronary artery without angina pectoris: Secondary | ICD-10-CM | POA: Diagnosis not present

## 2022-08-30 DIAGNOSIS — I739 Peripheral vascular disease, unspecified: Secondary | ICD-10-CM | POA: Diagnosis not present

## 2022-08-30 DIAGNOSIS — Z87891 Personal history of nicotine dependence: Secondary | ICD-10-CM

## 2022-08-30 DIAGNOSIS — Z7951 Long term (current) use of inhaled steroids: Secondary | ICD-10-CM | POA: Diagnosis not present

## 2022-08-30 DIAGNOSIS — D62 Acute posthemorrhagic anemia: Secondary | ICD-10-CM | POA: Diagnosis not present

## 2022-08-30 DIAGNOSIS — I10 Essential (primary) hypertension: Secondary | ICD-10-CM | POA: Diagnosis present

## 2022-08-30 DIAGNOSIS — J9611 Chronic respiratory failure with hypoxia: Secondary | ICD-10-CM | POA: Diagnosis not present

## 2022-08-30 DIAGNOSIS — Z886 Allergy status to analgesic agent status: Secondary | ICD-10-CM

## 2022-08-30 DIAGNOSIS — K551 Chronic vascular disorders of intestine: Secondary | ICD-10-CM | POA: Diagnosis present

## 2022-08-30 DIAGNOSIS — Z79899 Other long term (current) drug therapy: Secondary | ICD-10-CM | POA: Diagnosis not present

## 2022-08-30 DIAGNOSIS — Z885 Allergy status to narcotic agent status: Secondary | ICD-10-CM

## 2022-08-30 DIAGNOSIS — R0600 Dyspnea, unspecified: Secondary | ICD-10-CM | POA: Diagnosis not present

## 2022-08-30 DIAGNOSIS — I6523 Occlusion and stenosis of bilateral carotid arteries: Secondary | ICD-10-CM | POA: Diagnosis not present

## 2022-08-30 DIAGNOSIS — R400 Somnolence: Secondary | ICD-10-CM | POA: Diagnosis not present

## 2022-08-30 DIAGNOSIS — R911 Solitary pulmonary nodule: Secondary | ICD-10-CM | POA: Diagnosis not present

## 2022-08-30 DIAGNOSIS — K259 Gastric ulcer, unspecified as acute or chronic, without hemorrhage or perforation: Secondary | ICD-10-CM | POA: Diagnosis not present

## 2022-08-30 DIAGNOSIS — Z66 Do not resuscitate: Secondary | ICD-10-CM | POA: Diagnosis present

## 2022-08-30 DIAGNOSIS — Z9981 Dependence on supplemental oxygen: Secondary | ICD-10-CM | POA: Diagnosis not present

## 2022-08-30 DIAGNOSIS — K920 Hematemesis: Secondary | ICD-10-CM | POA: Diagnosis not present

## 2022-08-30 DIAGNOSIS — Z95828 Presence of other vascular implants and grafts: Secondary | ICD-10-CM | POA: Diagnosis not present

## 2022-08-30 DIAGNOSIS — R000 Tachycardia, unspecified: Secondary | ICD-10-CM | POA: Diagnosis not present

## 2022-08-30 DIAGNOSIS — K922 Gastrointestinal hemorrhage, unspecified: Secondary | ICD-10-CM | POA: Diagnosis not present

## 2022-08-30 DIAGNOSIS — R0902 Hypoxemia: Secondary | ICD-10-CM | POA: Diagnosis not present

## 2022-08-30 DIAGNOSIS — K3189 Other diseases of stomach and duodenum: Secondary | ICD-10-CM | POA: Diagnosis not present

## 2022-08-30 DIAGNOSIS — K257 Chronic gastric ulcer without hemorrhage or perforation: Secondary | ICD-10-CM | POA: Diagnosis not present

## 2022-08-30 DIAGNOSIS — Z880 Allergy status to penicillin: Secondary | ICD-10-CM

## 2022-08-30 DIAGNOSIS — R1084 Generalized abdominal pain: Secondary | ICD-10-CM | POA: Diagnosis not present

## 2022-08-30 DIAGNOSIS — T182XXA Foreign body in stomach, initial encounter: Secondary | ICD-10-CM | POA: Diagnosis not present

## 2022-08-30 DIAGNOSIS — R0602 Shortness of breath: Secondary | ICD-10-CM | POA: Diagnosis not present

## 2022-08-30 DIAGNOSIS — Z743 Need for continuous supervision: Secondary | ICD-10-CM | POA: Diagnosis not present

## 2022-08-30 DIAGNOSIS — I959 Hypotension, unspecified: Secondary | ICD-10-CM | POA: Diagnosis not present

## 2022-08-30 LAB — CBC WITH DIFFERENTIAL/PLATELET
Abs Immature Granulocytes: 0.06 10*3/uL (ref 0.00–0.07)
Abs Immature Granulocytes: 0.08 10*3/uL — ABNORMAL HIGH (ref 0.00–0.07)
Basophils Absolute: 0 10*3/uL (ref 0.0–0.1)
Basophils Absolute: 0 10*3/uL (ref 0.0–0.1)
Basophils Relative: 0 %
Basophils Relative: 0 %
Eosinophils Absolute: 0 10*3/uL (ref 0.0–0.5)
Eosinophils Absolute: 0 10*3/uL (ref 0.0–0.5)
Eosinophils Relative: 0 %
Eosinophils Relative: 0 %
HCT: 31.8 % — ABNORMAL LOW (ref 36.0–46.0)
HCT: 23.5 % — ABNORMAL LOW (ref 36.0–46.0)
Hemoglobin: 9.5 g/dL — ABNORMAL LOW (ref 12.0–15.0)
Hemoglobin: 7.5 g/dL — ABNORMAL LOW (ref 12.0–15.0)
Immature Granulocytes: 1 %
Immature Granulocytes: 1 %
Lymphocytes Relative: 3 %
Lymphocytes Relative: 5 %
Lymphs Abs: 0.4 10*3/uL — ABNORMAL LOW (ref 0.7–4.0)
Lymphs Abs: 0.5 10*3/uL — ABNORMAL LOW (ref 0.7–4.0)
MCH: 29.8 pg (ref 26.0–34.0)
MCH: 31 pg (ref 26.0–34.0)
MCHC: 29.9 g/dL — ABNORMAL LOW (ref 30.0–36.0)
MCHC: 31.9 g/dL (ref 30.0–36.0)
MCV: 99.7 fL (ref 80.0–100.0)
MCV: 97.1 fL (ref 80.0–100.0)
Monocytes Absolute: 0.4 10*3/uL (ref 0.1–1.0)
Monocytes Absolute: 0.8 10*3/uL (ref 0.1–1.0)
Monocytes Relative: 4 %
Monocytes Relative: 8 %
Neutro Abs: 10.2 10*3/uL — ABNORMAL HIGH (ref 1.7–7.7)
Neutro Abs: 9 10*3/uL — ABNORMAL HIGH (ref 1.7–7.7)
Neutrophils Relative %: 92 %
Neutrophils Relative %: 86 %
Platelets: 149 10*3/uL — ABNORMAL LOW (ref 150–400)
Platelets: 136 10*3/uL — ABNORMAL LOW (ref 150–400)
RBC: 3.19 MIL/uL — ABNORMAL LOW (ref 3.87–5.11)
RBC: 2.42 MIL/uL — ABNORMAL LOW (ref 3.87–5.11)
RDW: 13.4 % (ref 11.5–15.5)
RDW: 13.4 % (ref 11.5–15.5)
WBC: 11.1 10*3/uL — ABNORMAL HIGH (ref 4.0–10.5)
WBC: 10.5 10*3/uL (ref 4.0–10.5)
nRBC: 0 % (ref 0.0–0.2)
nRBC: 0 % (ref 0.0–0.2)

## 2022-08-30 LAB — COMPREHENSIVE METABOLIC PANEL
ALT: 19 U/L (ref 0–44)
AST: 24 U/L (ref 15–41)
Albumin: 3.6 g/dL (ref 3.5–5.0)
Alkaline Phosphatase: 87 U/L (ref 38–126)
Anion gap: 12 (ref 5–15)
BUN: 57 mg/dL — ABNORMAL HIGH (ref 8–23)
CO2: 32 mmol/L (ref 22–32)
Calcium: 9.4 mg/dL (ref 8.9–10.3)
Chloride: 95 mmol/L — ABNORMAL LOW (ref 98–111)
Creatinine, Ser: 0.82 mg/dL (ref 0.44–1.00)
GFR, Estimated: >60 mL/min (ref >60)
Glucose, Bld: 113 mg/dL — ABNORMAL HIGH (ref 70–99)
Potassium: 5 mmol/L (ref 3.5–5.1)
Sodium: 139 mmol/L (ref 135–145)
Total Bilirubin: 0.7 mg/dL (ref 0.3–1.2)
Total Protein: 6.6 g/dL (ref 6.5–8.1)

## 2022-08-30 LAB — PROTIME-INR
INR: 1 (ref 0.8–1.2)
Prothrombin Time: 13.4 seconds (ref 11.4–15.2)

## 2022-08-30 LAB — LIPASE, BLOOD: Lipase: 35 U/L (ref 11–51)

## 2022-08-30 LAB — PREPARE RBC (CROSSMATCH)

## 2022-08-30 MED ORDER — UMECLIDINIUM BROMIDE 62.5 MCG/ACT IN AEPB
1.0000 | INHALATION_SPRAY | Freq: Every day | RESPIRATORY_TRACT | Status: DC
  Filled 2022-08-30: qty 7

## 2022-08-30 MED ORDER — ALBUTEROL SULFATE HFA 108 (90 BASE) MCG/ACT IN AERS
2.0000 | INHALATION_SPRAY | Freq: Once | RESPIRATORY_TRACT | Status: AC
  Administered 2022-08-30: 2 via RESPIRATORY_TRACT
  Filled 2022-08-30: qty 6.7

## 2022-08-30 MED ORDER — ACETAMINOPHEN 325 MG PO TABS: 650.0000 mg | ORAL_TABLET | Freq: Four times a day (QID) | ORAL | Status: DC | PRN

## 2022-08-30 MED ORDER — FLUTICASONE PROPIONATE HFA 110 MCG/ACT IN AERO: 2.0000 | INHALATION_SPRAY | Freq: Every day | RESPIRATORY_TRACT | Status: DC

## 2022-08-30 MED ORDER — ARFORMOTEROL TARTRATE 15 MCG/2ML IN NEBU: 15.0000 ug | INHALATION_SOLUTION | Freq: Two times a day (BID) | RESPIRATORY_TRACT | Status: DC

## 2022-08-30 MED ORDER — PANTOPRAZOLE SODIUM 40 MG IV SOLR
40.0000 mg | Freq: Two times a day (BID) | INTRAVENOUS | Status: DC
  Administered 2022-08-31 – 2022-09-05 (×12): 40 mg via INTRAVENOUS
  Filled 2022-08-30 (×12): qty 10

## 2022-08-30 MED ORDER — SODIUM CHLORIDE 0.9 % IV BOLUS
500.0000 mL | Freq: Once | INTRAVENOUS | Status: AC
  Administered 2022-08-30: 500 mL via INTRAVENOUS

## 2022-08-30 MED ORDER — ALPRAZOLAM 0.5 MG PO TABS
0.5000 mg | ORAL_TABLET | Freq: Every day | ORAL | Status: DC
  Administered 2022-08-31 – 2022-09-01 (×3): 0.5 mg via ORAL
  Filled 2022-08-30: qty 2
  Filled 2022-08-30 (×2): qty 1

## 2022-08-30 MED ORDER — SODIUM CHLORIDE 0.9% IV SOLUTION: Freq: Once | INTRAVENOUS | Status: AC

## 2022-08-30 MED ORDER — GABAPENTIN 300 MG PO CAPS: 300.0000 mg | ORAL_CAPSULE | ORAL | Status: DC

## 2022-08-30 MED ORDER — IPRATROPIUM-ALBUTEROL 0.5-2.5 (3) MG/3ML IN SOLN
3.0000 mL | Freq: Four times a day (QID) | RESPIRATORY_TRACT | Status: DC | PRN
  Administered 2022-08-30 – 2022-09-04 (×3): 3 mL via RESPIRATORY_TRACT
  Filled 2022-08-30 (×3): qty 3

## 2022-08-30 MED ORDER — BUDESONIDE 0.25 MG/2ML IN SUSP
0.2500 mg | Freq: Two times a day (BID) | RESPIRATORY_TRACT | Status: DC
  Administered 2022-08-31 – 2022-09-05 (×10): 0.25 mg via RESPIRATORY_TRACT
  Filled 2022-08-30 (×11): qty 2

## 2022-08-30 MED ORDER — POLYETHYLENE GLYCOL 3350 17 G PO PACK: 17.0000 g | PACK | Freq: Every day | ORAL | Status: DC | PRN

## 2022-08-30 MED ORDER — GABAPENTIN 300 MG PO CAPS
600.0000 mg | ORAL_CAPSULE | Freq: Every day | ORAL | Status: DC
  Administered 2022-08-31 – 2022-09-02 (×4): 600 mg via ORAL
  Filled 2022-08-30 (×4): qty 2

## 2022-08-30 MED ORDER — ACETAMINOPHEN 650 MG RE SUPP: 650.0000 mg | Freq: Four times a day (QID) | RECTAL | Status: DC | PRN

## 2022-08-30 MED ORDER — PANTOPRAZOLE SODIUM 40 MG IV SOLR
40.0000 mg | Freq: Once | INTRAVENOUS | Status: AC
  Administered 2022-08-30: 40 mg via INTRAVENOUS
  Filled 2022-08-30: qty 10

## 2022-08-30 MED ORDER — ALPRAZOLAM 0.25 MG PO TABS
0.2500 mg | ORAL_TABLET | Freq: Every morning | ORAL | Status: DC
  Administered 2022-08-31 – 2022-09-02 (×3): 0.25 mg via ORAL
  Filled 2022-08-30 (×3): qty 1

## 2022-08-30 MED ORDER — SODIUM CHLORIDE 0.9% FLUSH
3.0000 mL | Freq: Two times a day (BID) | INTRAVENOUS | Status: DC
  Administered 2022-08-30 – 2022-09-05 (×12): 3 mL via INTRAVENOUS

## 2022-08-30 MED ORDER — ALPRAZOLAM 0.25 MG PO TABS: 0.2500 mg | ORAL_TABLET | ORAL | Status: DC

## 2022-08-30 MED ORDER — ONDANSETRON HCL 4 MG/2ML IJ SOLN
4.0000 mg | Freq: Four times a day (QID) | INTRAMUSCULAR | Status: DC | PRN
  Administered 2022-08-30 – 2022-09-01 (×2): 4 mg via INTRAVENOUS
  Filled 2022-08-30 (×2): qty 2

## 2022-08-30 MED ORDER — GABAPENTIN 300 MG PO CAPS
300.0000 mg | ORAL_CAPSULE | Freq: Every day | ORAL | Status: DC
  Administered 2022-08-31 – 2022-09-02 (×3): 300 mg via ORAL
  Filled 2022-08-30 (×3): qty 1

## 2022-08-30 NOTE — ED Notes (Signed)
Admitting provider paged at this time for nausea medication

## 2022-08-30 NOTE — ED Triage Notes (Signed)
From home via ems with c/o generalized abdominal pain with 2 episodes of blood in vomit that onset at 0230. Per ems pt tachycardic and hypotensive.

## 2022-08-30 NOTE — ED Notes (Signed)
Blood transfusion completed at this time. Patient states she is feeling better. Denies any adverse reactions or side effects from transfusion.

## 2022-08-30 NOTE — ED Notes (Signed)
Patient had an episode of vomiting at 1830. Small amount of black, coffee ground-like emesis. MD aware.

## 2022-08-30 NOTE — ED Notes (Signed)
Patient tolerating blood transfusion well at this time. No s/s of adverse reactions. Patient instructed to call this RN if she starts to feel any changes, patient verbalizes understanding.

## 2022-08-30 NOTE — ED Notes (Signed)
Dr. Bridgett Larsson responded to page, receiving order for zofran

## 2022-08-30 NOTE — H&P (Addendum)
History and Physical   Kelly Phillips LEX:[id number] DOB: 07-12-1946 DOA: 08/30/2022  PCP: Venetia Maxon, Sharon Mt, MD   Patient coming from: Home  Chief Complaint: Hematemesis  HPI: Kelly Phillips is a 77 y.o. female with medical history significant of GI bleed, GERD, PAD, mesenteric artery stenosis, carotid artery disease, macular degeneration, COPD, chronic respiratory failure on oxygen, hypertension, legal blindness presenting with hematemesis.  Patient reports the at 2:30 AM she had sudden epigastric pain with associated nausea and vomiting.  And then she had additional episode of vomiting prior to presentation.  She has legal blindness from her macular degeneration however she noted that the vomit tasted of blood.  Reportedly caregiver did also witness this bloody vomit.  Is unclear if it was coffee-ground versus bright red blood.  She does have prior history of upper GI bleeding.  Had a gastric ulcer in 2020 and lower GI bleeding was due to diverticulosis.  She does drink a daily glass of wine and takes meloxicam nightly.  She denies fevers, chills, chest pain, shortness of breath, abdominal pain, constipation, diarrhea  ED Course: Vital signs in the ED significant for blood pressure in the 80s to 90s initially improved to the 110s after IV fluids.  Heart rate in the 110s to 120s, respiratory rate in the 20s to 30s, on chronic 3 to 4 L.  Lab workup included CMP with chloride 95, BUN 57, glucose 113.  CBC with hemoglobin 9.5 from unclear baseline previous labs were almost 2 years ago with hemoglobin of 10-13 at that time however the 10 was during GI bleed admission to the baseline may be closer to 13.  Lipase normal.  Type and screen performed.  Chest x-ray showed no acute abnormality and evidence of known COPD.  Patient received a liter of fluids and IV PPI in the ED.  Patient did have another episode of hematemesis in the ED.  GI consulted and agree with IV PPI and recommend n.p.o. at least at  midnight will plan for endoscopy tomorrow morning unless patient has recurrent hematemesis or decompensates.  Review of Systems: As per HPI otherwise all other systems reviewed and are negative.  Past Medical History:  Diagnosis Date   COPD (chronic obstructive pulmonary disease) (HCC)    COPD (chronic obstructive pulmonary disease) with emphysema ([place]) 05/02/2015   COVID-19 virus infection    PAD (peripheral artery disease) (HCC)    SMA stenosis    Upper GI bleed 07/26/2019    Past Surgical History:  Procedure Laterality Date   AORTO-FEMORAL BYPASS GRAFT     BIOPSY  03/06/2020   Procedure: BIOPSY;  Surgeon: Otis Brace, MD;  Location: [facility] ENDOSCOPY;  Service: Gastroenterology;;   COLONOSCOPY N/A 08/28/2020   Procedure: COLONOSCOPY;  Surgeon: Otis Brace, MD;  Location: [facility] ENDOSCOPY;  Service: Gastroenterology;  Laterality: N/A;   ESOPHAGOGASTRODUODENOSCOPY (EGD) WITH PROPOFOL N/A 07/31/2019   Procedure: ESOPHAGOGASTRODUODENOSCOPY (EGD) WITH PROPOFOL;  Surgeon: Wonda Horner, MD;  Location[facility]COPY;  Service: Endoscopy;  Laterality: N/A;   ESOPHAGOGASTRODUODENOSCOPY (EGD) WITH PROPOFOL N/A 03/06/2020   Procedure: ESOPHAGOGASTRODUODENOSCOPY (EGD) WITH PROPOFOL;  Surgeon: Otis Brace, MD;  Location: [facility] ENDOSCOPY;  Service: Gastroenterology;  Laterality: N/A;   HEMOSTASIS CLIP PLACEMENT  07/31/2019   Procedure: HEMOSTASIS CLIP PLACEMENT;  Surgeon: Wonda Horner, MD;  Location: [facility] ENDOSCOPY;  Service: Endoscopy;;   POLYPECTOMY  08/28/2020   Procedure: POLYPECTOMY;  Surgeon: Otis Brace, MD;  Location: [facility] ENDOSCOPY;  Service: Gastroenterology;;   Clide Deutscher  07/31/2019   Procedure: SCLEROTHERAPY;  Surgeon: Wonda Horner, MD;  Location: [facility] ENDOSCOPY;  Service: Endoscopy;;    Social History  reports that she quit smoking about 7 years ago. She has never used smokeless tobacco. She reports that she does not use drugs. No history on file for alcohol use.  Allergies   Allergen Reactions   Codeine Swelling    Facial swelling   Salmon [Fish Allergy] Anaphylaxis and Swelling   Penicillins Nausea And Vomiting   Aspirin Tinitus    Family History  Problem Relation Age of Onset   Cancer Father   Reviewed on admission  Prior to Admission medications   Medication Sig Start Date End Date Taking? Authorizing Provider  ALPRAZolam Duanne Moron) 0.5 MG tablet Take 0.25-0.5 mg by mouth See admin instructions. Take 1/2 tablet (0.25 mg) by mouth every morning and 1 tablet (0.5 mg) at night 01/25/20   [provider]  amLODipine-benazepril (LOTREL) 5-10 MG capsule Take 1 capsule by mouth at bedtime. 09/01/19   [provider]  aspirin EC 81 MG tablet Take 81 mg by mouth at bedtime. Swallow whole.    [provider]  baclofen (LIORESAL) 20 MG tablet Take 10 mg by mouth 2 (two) times daily as needed for muscle spasms. 08/01/20   [provider]  Cholecalciferol (VITAMIN D-3) 125 MCG (5000 UT) TABS Take 5,000 Units by mouth at bedtime.    [provider]  cilostazol (PLETAL) 100 MG tablet Take 100 mg by mouth 2 (two) times daily. 08/06/20   [provider]  cyanocobalamin (,VITAMIN B-12,) 1000 MCG/ML injection Inject 1,000 mcg into the muscle every 30 (thirty) days.    [provider]  docusate sodium (COLACE) 100 MG capsule Take 100-200 mg by mouth See admin instructions. Take one capsule (100 mg) by mouth every other night at bedtime, take two capsules (200 mg) on alternate nights    [provider]  fluticasone (FLOVENT HFA) 110 MCG/ACT inhaler Inhale 2 puffs into the lungs daily.    [provider]  gabapentin (NEURONTIN) 300 MG capsule Take 300-600 mg by mouth See admin instructions. Take one capsule (300 mg) by mouth after lunch and two capsules (600 mg) at night 11/28/15   [provider]  OXYGEN Inhale 3 L into the lungs continuous.    [provider]  pantoprazole (PROTONIX) 40  MG tablet Take 1 tablet (40 mg total) by mouth daily. 08/28/20 08/28/21  Danford, Suann Larry, MD  polyethylene glycol (MIRALAX / GLYCOLAX) 17 g packet Take 17 g by mouth daily as needed for mild constipation or moderate constipation. 08/09/19   Mariel Aloe, MD  Tiotropium Bromide-Olodaterol (STIOLTO RESPIMAT) 2.5-2.5 MCG/ACT AERS Inhale 2 puffs into the lungs daily.    [provider]    Physical Exam: Vitals:   08/30/22 1430 08/30/22 1445 08/30/22 1500 08/30/22 1530  BP: '90/72 97/83 93/78 '$ 116/81  Pulse: (!) 113 (!) 112 (!) 116 (!) 120  Resp: (!) 27 (!) 29 (!) 27 (!) 31  Temp:      TempSrc:      SpO2: 100% 100% 100% 100%  Weight:      Height:        Physical Exam Constitutional:      General: She is not in acute distress.    Appearance: Normal appearance.  HENT:     Head: Normocephalic and atraumatic.     Mouth/Throat:     Mouth: Mucous membranes are moist.     Pharynx: Oropharynx is clear.  Eyes:     Extraocular Movements: Extraocular movements intact.     Pupils: Pupils are equal, round, and reactive to light.  Cardiovascular:     Rate and Rhythm: Regular rhythm. Tachycardia present.     Pulses: Normal pulses.     Heart sounds: Normal heart sounds.  Pulmonary:     Effort: Pulmonary effort is normal. Tachypnea present. No respiratory distress.     Breath sounds: Normal breath sounds.  Abdominal:     General: Bowel sounds are normal. There is no distension.     Palpations: Abdomen is soft.     Tenderness: There is no abdominal tenderness.  Musculoskeletal:        General: No swelling or deformity.  Skin:    General: Skin is warm and dry.     Coloration: Skin is pale.  Neurological:     General: No focal deficit present.     Mental Status: Mental status is at baseline.    Labs on Admission: I have personally reviewed following labs and imaging studies  CBC: Recent Labs  Lab 08/30/22 1350  WBC 11.1*  NEUTROABS 10.2*  HGB 9.5*  HCT 31.8*  MCV 99.7   PLT 149*    Basic Metabolic Panel: Recent Labs  Lab 08/30/22 1400  NA 139  K 5.0  CL 95*  CO2 32  GLUCOSE 113*  BUN 57*  CREATININE 0.82  CALCIUM 9.4    GFR: Estimated Creatinine Clearance: 49.8 mL/min (by C-G formula based on SCr of 0.82 mg/dL).  Liver Function Tests: Recent Labs  Lab 08/30/22 1400  AST 24  ALT 19  ALKPHOS 87  BILITOT 0.7  PROT 6.6  ALBUMIN 3.6    Urine analysis:    Component Value Date/Time   COLORURINE YELLOW 05/23/2015 [place] 05/23/2015 1521   LABSPEC 1.006 05/23/2015 1521   PHURINE 8.5 (H) 05/23/2015 1521   GLUCOSEU NEGATIVE 05/23/2015 1521   HGBUR TRACE (A) 05/23/2015 1521   BILIRUBINUR NEGATIVE 05/23/2015 1521   KETONESUR NEGATIVE 05/23/2015 1521   PROTEINUR NEGATIVE 05/23/2015 1521   UROBILINOGEN 0.2 05/23/2015 1521   NITRITE NEGATIVE 05/23/2015 1521   LEUKOCYTESUR LARGE (A) 05/23/2015 1521    Radiological Exams on Admission: DG Chest 2 View  Result Date: 08/30/2022 CLINICAL DATA:  SOB chronic cough EXAM: CHEST - 2 VIEW COMPARISON:  07/21/2020. FINDINGS: The heart size and mediastinal contours are within normal limits. Lungs are hyperinflated suggesting COPD. There is no focal consolidation. No pneumothorax or pleural effusion. Aorta is calcified. The visualized skeletal structures are unremarkable. IMPRESSION: Findings suggest COPD.  Otherwise no active cardiopulmonary disease. Electronically Signed   By: Sammie Bench M.D.   On: 08/30/2022 15:55    EKG: Not yet performed  Assessment/Plan Principal Problem:   Acute upper GI bleeding Active Problems:   Chronic respiratory failure with hypoxia (HCC)   GERD without esophagitis   Peripheral vascular disease, unspecified (HCC)   Status post aortobifemoral bypass surgery   Benign essential hypertension   Bilateral carotid artery stenosis   Superior mesenteric artery stenosis (HCC)   COPD (chronic obstructive pulmonary disease) with emphysema (HCC)   Acute  GI bleeding > Patient presenting with reported upper GI bleeding.  2 episodes of hematemesis at home and 1 in the ED. > Has had low normal blood pressure in the ED, will need close monitoring.  > Lab workup shows CBC with hemoglobin of 9.5 from unclear baseline was around 10 a couple years ago although this was  in the setting of previous GI bleed with baseline possibly closer to 13. > BUN elevated consistent with upper GI bleed as well. > Had upper GI bleed and 2020 from gastric ulcer. > GI consulted and will see the patient with plan for endoscopy in the morning unless she has recurrent hematemesis or decompensates. - Monitor on progressive unit - Continue with IV PPI - Continue to trend CBC overnight - N.p.o. ADDENDUM > Hemoglobin now 7.5 from 9.5. Partial dilution component likely. > RN reports 2 further episodes of small coffee-ground emesis  > GI provider, who is also covering overnight updated with these changes as well - Transfuse 1 unit PRBCs to stead ahead of bleeding - Plan remains for EGD in the morning unless patient becomes unstable or more significant recurrent hematemesis.  - Continue to trend CBC  PAD Carotid artery disease Mesenteric artery stenosis > Status post aortobifem bypass - Hold home aspirin and cilostazol  Hypertension - Holding home amlodipine-benazepril given low normal blood pressure in the ED.  Macular degeneration Legal blindness - Noted  COPD Chronic respiratory failure with hypoxia > On chronic 3 to 4 L at baseline - Continue home budesonide and DuoNeb nebs - Does not appear to have other medications prescribed based on the list she brought with her, will need to have this confirmed with pharmacy assistance as there is disagreement between her written list, [facility] everywhere summary and our list.  DVT prophylaxis: SCDs Code Status:   DNR/DNI, discussed with patient. Family Communication:  Caregiver updated at bedside, she is in  communication with son who is in [place].  Disposition Plan:   Patient is from:  Home  Anticipated DC to:  Home  Anticipated DC date:  2 to 4 days  Anticipated DC barriers: None  Consults called:  [facility]enterology, [facility] Admission status:  Inpatient, progressive  Severity of Illness: The appropriate patient status for this patient is INPATIENT. Inpatient status is judged to be reasonable and necessary in order to provide the required intensity of service to ensure the patient's safety. The patient's presenting symptoms, physical exam findings, and initial radiographic and laboratory data in the context of their chronic comorbidities is felt to place them at high risk for further clinical deterioration. Furthermore, it is not anticipated that the patient will be medically stable for discharge from the hospital within 2 midnights of admission.   * I certify that at the point of admission it is my clinical judgment that the patient will require inpatient hospital care spanning beyond 2 midnights from the point of admission due to high intensity of service, high risk for further deterioration and high frequency of surveillance required.Marcelyn Bruins MD [facility]  How to contact the [facility] Attending or Consulting provider [place] or covering provider during after hours [place], for this patient?   Check the care team in [facility] and look for a) attending/consulting [facility] provider listed and b) the [facility] team listed Log into [email or web address] and use [place]'s universal password to access. If you do not have the password, please contact the hospital operator. Locate the [place] provider you are looking for under [facility] and page to a number that you can be directly reached. If you still have difficulty reaching the provider, please page the [facility] (Director on Call) for the Hospitalists listed on amion for assistance.  08/30/2022, 4:35 PM

## 2022-08-30 NOTE — ED Provider Notes (Signed)
Assume Care - Medical Decision Making  Care of patient assumed from previous emergency medicine provider. See their note for further details of history, physical exam and plan.  Briefly, Kelly Phillips is a 77 y.o. female who presents with: H/o upper and lower GI bleed (diverticular dz) H/w hematemesis Last night epigastric abd px, had episode of hematemesis, another one this AM Mild epigastric px Unsure about dark stools Wine daily, 1 glass, meloxicam daily Last time had gastric ulcer  Tachycardic, softer BP S/p 500 IVF, 40 mg IV protonix  Reassessment:  I personally reassessed the patient: Vital Signs:  The most current vitals were  Vitals:   08/30/22 1430 08/30/22 1445  BP: 90/72 97/83  Pulse: (!) 113 (!) 112  Resp: (!) 27 (!) 29  Temp:    SpO2: 100% 100%  Hemodynamics:  The patient is tachycardic with borderline low but improving blood pressures Mental Status:  The patient is alert   Additional MDM/ED Course: Discussed case with admitting hospitalist service.  They will admit the patient, GI to scope tomorrow morning.  In the meantime, obtaining repeat CBC as well as giving another 500 mL of IV fluids.  Accepted by hospitalist for ongoing monitoring, evaluation, and treatment.  No further acute events the emergency department prior to their admission.    Phyllis Ginger, MD 08/30/22 1640    Carmin Muskrat, MD 08/31/22 2244

## 2022-08-30 NOTE — ED Provider Notes (Addendum)
[place] EMERGENCY DEPARTMENT Provider Note   CSN: [id number] Arrival date & time: 08/30/22  1159     History  Chief Complaint  Patient presents with   Hematemesis    C/o generalized abdominal pain with 2 episodes of vomiting blood that onset at 0230. Hx of GI bleed.     Kelly Phillips is a 77 y.o. female.  With past medical history of upper and lower GI bleed, COPD on 3 to 4 L home O2, hypertension, peripheral artery disease who presents to the emergency department with hematemesis.  Patient states that about 230 this morning she had an onset of epigastric abdominal pain.  She states that she was then nauseated and had an episode of vomiting.  She states after this, but prior to ER presentation, she had another episode of vomiting.  The patient is legally blind but she states that she can taste blood on her lips.  She does have a caregiver who noted her to have hematemesis.  It is unclear whether this was bright red blood or coffee-ground emesis.  The patient is noting ongoing mild epigastric abdominal tenderness.  She is unsure if she has had any dark stools or hematochezia.  She denies any fevers.  Denies recent abdominal trauma.  She does endorse daily 1 glass of wine as well as takes meloxicam nightly.  On chart review she has had previous upper and lower GI bleed.  Followed by [facility].  Most recently in 2020 she had bleeding gastric ulcer that was clipped.  Additionally had lower GI bleed with diverticular disease.  Has had no incidence of GI bleed since then.  HPI     Home Medications Prior to Admission medications   Medication Sig Start Date End Date Taking? Authorizing Provider  ALPRAZolam Duanne Moron) 0.5 MG tablet Take 0.25-0.5 mg by mouth See admin instructions. Take 1/2 tablet (0.25 mg) by mouth every morning and 1 tablet (0.5 mg) at night 01/25/20   [provider]  amLODipine-benazepril (LOTREL) 5-10 MG capsule Take 1 capsule by mouth at bedtime.  09/01/19   [provider]  aspirin EC 81 MG tablet Take 81 mg by mouth at bedtime. Swallow whole.    [provider]  baclofen (LIORESAL) 20 MG tablet Take 10 mg by mouth 2 (two) times daily as needed for muscle spasms. 08/01/20   [provider]  Cholecalciferol (VITAMIN D-3) 125 MCG (5000 UT) TABS Take 5,000 Units by mouth at bedtime.    [provider]  cilostazol (PLETAL) 100 MG tablet Take 100 mg by mouth 2 (two) times daily. 08/06/20   [provider]  cyanocobalamin (,VITAMIN B-12,) 1000 MCG/ML injection Inject 1,000 mcg into the muscle every 30 (thirty) days.    [provider]  docusate sodium (COLACE) 100 MG capsule Take 100-200 mg by mouth See admin instructions. Take one capsule (100 mg) by mouth every other night at bedtime, take two capsules (200 mg) on alternate nights    [provider]  fluticasone (FLOVENT HFA) 110 MCG/ACT inhaler Inhale 2 puffs into the lungs daily.    [provider]  gabapentin (NEURONTIN) 300 MG capsule Take 300-600 mg by mouth See admin instructions. Take one capsule (300 mg) by mouth after lunch and two capsules (600 mg) at night 11/28/15   [provider]  OXYGEN Inhale 3 L into the lungs continuous.    [provider]  pantoprazole (PROTONIX) 40 MG tablet Take 1 tablet (40 mg total) by  mouth daily. 08/28/20 08/28/21  Danford, Suann Larry, MD  polyethylene glycol (MIRALAX / GLYCOLAX) 17 g packet Take 17 g by mouth daily as needed for mild constipation or moderate constipation. 08/09/19   Mariel Aloe, MD  Tiotropium Bromide-Olodaterol (STIOLTO RESPIMAT) 2.5-2.5 MCG/ACT AERS Inhale 2 puffs into the lungs daily.    [provider]      Allergies    Codeine, Salmon [fish allergy], Penicillins, and Aspirin    Review of Systems   Review of Systems  Gastrointestinal:  Positive for abdominal pain, nausea and vomiting.  All other systems reviewed and are  negative.   Physical Exam Updated Vital Signs BP 116/81   Pulse (!) 120   Temp (!) 97.4 F (36.3 C) (Oral)   Resp (!) 31   Ht '5\' 3"'$  (1.6 m)   Wt 54.4 kg   LMP  (LMP Unknown)   SpO2 100%   BMI 21.26 kg/m  Physical Exam Constitutional:      General: She is not in acute distress.    Appearance: She is ill-appearing.     Comments: Chronically ill-appearing  HENT:     Head: Normocephalic.     Nose: Nose normal.     Mouth/Throat:     Mouth: Mucous membranes are dry.     Pharynx: Oropharynx is clear.     Comments: Dried blood on lips Eyes:     Extraocular Movements: Extraocular movements intact.  Cardiovascular:     Rate and Rhythm: Regular rhythm. Tachycardia present.     Pulses: Normal pulses.     Heart sounds: No murmur heard. Pulmonary:     Effort: Pulmonary effort is normal. No respiratory distress.     Breath sounds: Wheezing present. No rhonchi or rales.  Abdominal:     General: Abdomen is protuberant. Bowel sounds are normal.     Palpations: Abdomen is soft.     Tenderness: There is abdominal tenderness in the epigastric area. There is no guarding or rebound. Negative signs include Murphy's sign.  Musculoskeletal:     Cervical back: Neck supple.  Skin:    General: Skin is warm and dry.     Capillary Refill: Capillary refill takes less than 2 seconds.  Neurological:     General: No focal deficit present.     Mental Status: She is alert and oriented to person, place, and time. Mental status is at baseline.     ED Results / Procedures / Treatments   Labs (all labs ordered are listed, but only abnormal results are displayed) Labs Reviewed  COMPREHENSIVE METABOLIC PANEL - Abnormal; Notable for the following components:      Result Value   Chloride 95 (*)    Glucose, Bld 113 (*)    BUN 57 (*)    All other components within normal limits  CBC WITH DIFFERENTIAL/PLATELET - Abnormal; Notable for the following components:   WBC 11.1 (*)    RBC 3.19 (*)     Hemoglobin 9.5 (*)    HCT 31.8 (*)    MCHC 29.9 (*)    Platelets 149 (*)    Neutro Abs 10.2 (*)    Lymphs Abs 0.4 (*)    All other components within normal limits  PROTIME-INR  LIPASE, BLOOD  CBC WITH DIFFERENTIAL/PLATELET  CBC WITH DIFFERENTIAL/PLATELET  TYPE AND SCREEN    EKG None  Radiology DG Chest 2 View  Result Date: 08/30/2022 CLINICAL DATA:  SOB chronic cough EXAM: CHEST - 2 VIEW COMPARISON:  07/21/2020.  FINDINGS: The heart size and mediastinal contours are within normal limits. Lungs are hyperinflated suggesting COPD. There is no focal consolidation. No pneumothorax or pleural effusion. Aorta is calcified. The visualized skeletal structures are unremarkable. IMPRESSION: Findings suggest COPD.  Otherwise no active cardiopulmonary disease. Electronically Signed   By: Sammie Bench M.D.   On: 08/30/2022 15:55    Procedures Procedures   Medications Ordered in ED Medications  sodium chloride 0.9 % bolus 500 mL (has no administration in time range)  pantoprazole (PROTONIX) injection 40 mg (40 mg Intravenous Given 08/30/22 1322)  sodium chloride 0.9 % bolus 500 mL (0 mLs Intravenous Stopped 08/30/22 1424)    ED Course/ Medical Decision Making/ A&P                           Medical Decision Making Amount and/or Complexity of Data Reviewed Labs: ordered. Radiology: ordered. ECG/medicine tests: ordered.  Risk Prescription drug management. Decision regarding hospitalization.  Care of patient handed off to Dr. Mammie Lorenzo, ED resident at this time. Patient pending admissionfor acute upper GI bleed.  Currently tachy with intermittent soft pressures. Appears to have 1g drop in hgb from most recent which was 2 years ago.   Called and spoke with Dr. Watt Climes - [facility] who requests pt be NPO at midnight for likely endoscopy in the AM  unless she has recurrent hematemesis in the ED or decompensation prior to then.  Consulted hospitalist and pending call back.  Repeating CBC given  persistent tachycardia.  1609: small volume coffee ground emesis per nursing.  Final Clinical Impression(s) / ED Diagnoses Final diagnoses:  Hematemesis with nausea    Rx / DC Orders ED Discharge Orders     None         Mickie Hillier, PA-C 08/30/22 1530    Mickie Hillier, PA-C 08/30/22 1620    Pattricia Boss, MD 08/30/22 [phone]

## 2022-08-31 ENCOUNTER — Encounter (HOSPITAL_COMMUNITY)
Admission: EM | Disposition: A | Discharge status: Skilled Nursing Facility | Payer: Self-pay | Source: Home / Self Care | Attending: Internal Medicine

## 2022-08-31 ENCOUNTER — Inpatient Hospital Stay (HOSPITAL_COMMUNITY): Payer: Medicare HMO | Admitting: Certified Registered"

## 2022-08-31 ENCOUNTER — Encounter (HOSPITAL_COMMUNITY): Payer: Self-pay | Admitting: Internal Medicine

## 2022-08-31 DIAGNOSIS — K449 Diaphragmatic hernia without obstruction or gangrene: Secondary | ICD-10-CM

## 2022-08-31 DIAGNOSIS — K922 Gastrointestinal hemorrhage, unspecified: Secondary | ICD-10-CM | POA: Diagnosis not present

## 2022-08-31 DIAGNOSIS — K259 Gastric ulcer, unspecified as acute or chronic, without hemorrhage or perforation: Secondary | ICD-10-CM

## 2022-08-31 DIAGNOSIS — T182XXA Foreign body in stomach, initial encounter: Secondary | ICD-10-CM

## 2022-08-31 DIAGNOSIS — I10 Essential (primary) hypertension: Secondary | ICD-10-CM

## 2022-08-31 DIAGNOSIS — Z87891 Personal history of nicotine dependence: Secondary | ICD-10-CM

## 2022-08-31 DIAGNOSIS — D62 Acute posthemorrhagic anemia: Secondary | ICD-10-CM

## 2022-08-31 HISTORY — PX: BIOPSY: SHX5522

## 2022-08-31 HISTORY — PX: ESOPHAGOGASTRODUODENOSCOPY (EGD) WITH PROPOFOL: SHX5813

## 2022-08-31 LAB — COMPREHENSIVE METABOLIC PANEL
ALT: 15 U/L (ref 0–44)
AST: 19 U/L (ref 15–41)
Albumin: 2.9 g/dL — ABNORMAL LOW (ref 3.5–5.0)
Alkaline Phosphatase: 64 U/L (ref 38–126)
Anion gap: 7 (ref 5–15)
BUN: 68 mg/dL — ABNORMAL HIGH (ref 8–23)
CO2: 31 mmol/L (ref 22–32)
Calcium: 8.8 mg/dL — ABNORMAL LOW (ref 8.9–10.3)
Chloride: 105 mmol/L (ref 98–111)
Creatinine, Ser: 0.75 mg/dL (ref 0.44–1.00)
GFR, Estimated: >60 mL/min (ref >60)
Glucose, Bld: 124 mg/dL — ABNORMAL HIGH (ref 70–99)
Potassium: 4.8 mmol/L (ref 3.5–5.1)
Sodium: 143 mmol/L (ref 135–145)
Total Bilirubin: 0.7 mg/dL (ref 0.3–1.2)
Total Protein: 5.2 g/dL — ABNORMAL LOW (ref 6.5–8.1)

## 2022-08-31 LAB — CBC
HCT: 28 % — ABNORMAL LOW (ref 36.0–46.0)
HCT: 26.1 % — ABNORMAL LOW (ref 36.0–46.0)
Hemoglobin: 9 g/dL — ABNORMAL LOW (ref 12.0–15.0)
Hemoglobin: 8 g/dL — ABNORMAL LOW (ref 12.0–15.0)
MCH: 29.7 pg (ref 26.0–34.0)
MCH: 29.3 pg (ref 26.0–34.0)
MCHC: 32.1 g/dL (ref 30.0–36.0)
MCHC: 30.7 g/dL (ref 30.0–36.0)
MCV: 92.4 fL (ref 80.0–100.0)
MCV: 95.6 fL (ref 80.0–100.0)
Platelets: 115 10*3/uL — ABNORMAL LOW (ref 150–400)
Platelets: 113 10*3/uL — ABNORMAL LOW (ref 150–400)
RBC: 3.03 MIL/uL — ABNORMAL LOW (ref 3.87–5.11)
RBC: 2.73 MIL/uL — ABNORMAL LOW (ref 3.87–5.11)
RDW: 15.9 % — ABNORMAL HIGH (ref 11.5–15.5)
RDW: 16 % — ABNORMAL HIGH (ref 11.5–15.5)
WBC: 12.6 10*3/uL — ABNORMAL HIGH (ref 4.0–10.5)
WBC: 12.3 10*3/uL — ABNORMAL HIGH (ref 4.0–10.5)
nRBC: 0 % (ref 0.0–0.2)
nRBC: 0 % (ref 0.0–0.2)

## 2022-08-31 SURGERY — ESOPHAGOGASTRODUODENOSCOPY (EGD) WITH PROPOFOL
Anesthesia: Monitor Anesthesia Care

## 2022-08-31 MED ORDER — PROPOFOL 10 MG/ML IV BOLUS
INTRAVENOUS | Status: DC | PRN
  Administered 2022-08-31: 30 mg via INTRAVENOUS

## 2022-08-31 MED ORDER — PROPOFOL 500 MG/50ML IV EMUL
INTRAVENOUS | Status: DC | PRN
  Administered 2022-08-31: 125 ug/kg/min via INTRAVENOUS

## 2022-08-31 MED ORDER — SODIUM CHLORIDE 0.9 % IV SOLN: INTRAVENOUS | Status: DC

## 2022-08-31 MED ORDER — LACTATED RINGERS IV SOLN
INTRAVENOUS | Status: AC | PRN
  Administered 2022-08-31: 20 mL/h via INTRAVENOUS

## 2022-08-31 MED ORDER — PHENYLEPHRINE 80 MCG/ML (10ML) SYRINGE FOR IV PUSH (FOR BLOOD PRESSURE SUPPORT)
PREFILLED_SYRINGE | INTRAVENOUS | Status: DC | PRN
  Administered 2022-08-31: 240 ug via INTRAVENOUS

## 2022-08-31 MED ORDER — LIDOCAINE 2% (20 MG/ML) 5 ML SYRINGE
INTRAMUSCULAR | Status: DC | PRN
  Administered 2022-08-31: 100 mg via INTRAVENOUS

## 2022-08-31 SURGICAL SUPPLY — 15 items

## 2022-08-31 NOTE — Consult Note (Signed)
Reason for Consult: GI bleeding upper   Referring Physician: Hospital team  Kelly Phillips is an 77 y.o. female.  HPI: Patient seen and examined in our office computer chart and her hospital computer chart reviewed and she has had bleeding ulcers in the past and she has been on arthritis pills at home as well as drinks half a glass of wine a day but she does not see well and is legally blind and has not noticed any diarrhea cannot comment on her color of her bowels but on Wednesday started feeling then and started throwing up on Friday and was noted to have some coffee-ground emesis by her caretaker presented to the emergency room and her family history is negative for any GI issues and day-to-day has no GI complaints and she did have minimal coffee-ground emesis in the ER Past Medical History:  Diagnosis Date   COPD (chronic obstructive pulmonary disease) ([place])    COPD (chronic obstructive pulmonary disease) with emphysema ([place]) 05/02/2015   COVID-19 virus infection    PAD (peripheral artery disease) ([place])    SMA stenosis    Upper GI bleed 07/26/2019    Past Surgical History:  Procedure Laterality Date   AORTO-FEMORAL BYPASS GRAFT     BIOPSY  03/06/2020   Procedure: BIOPSY;  Surgeon: Otis Brace, MD;  Location: [facility] ENDOSCOPY;  Service: Gastroenterology;;   COLONOSCOPY N/A 08/28/2020   Procedure: COLONOSCOPY;  Surgeon: Otis Brace, MD;  Location: [facility]OSCOPY;  Service: Gastroenterology;  Laterality: N/A;   ESOPHAGOGASTRODUODENOSCOPY (EGD) WITH PROPOFOL N/A 07/31/2019   Procedure: ESOPHAGOGASTRODUODENOSCOPY (EGD) WITH PROPOFOL;  Surgeon: Wonda Horner, MD;  Location: [facility] ENDOSCOPY;  Service: Endoscopy;  Laterality: N/A;   ESOPHAGOGASTRODUODENOSCOPY (EGD) WITH PROPOFOL N/A 03/06/2020   Procedure: ESOPHAGOGASTRODUODENOSCOPY (EGD) WITH PROPOFOL;  Surgeon: Otis Brace, MD;  Location: [facility] ENDOSCOPY;  Service: Gastroenterology;  Laterality: N/A;   HEMOSTASIS CLIP PLACEMENT  07/31/2019    Procedure: HEMOSTASIS CLIP PLACEMENT;  Surgeon: Wonda Horner, MD;  Location: [facility] ENDOSCOPY;  Service: Endoscopy;;   POLYPECTOMY  08/28/2020   Procedure: POLYPECTOMY;  Surgeon: Otis Brace, MD;  Location: [place];  Service: Gastroenterology;;   Clide Deutscher  07/31/2019   Procedure: SCLEROTHERAPY;  Surgeon: Wonda Horner, MD;  Location: [facility] ENDOSCOPY;  Service: Endoscopy;;    Family History  Problem Relation Age of Onset   Cancer Father     Social History:  reports that she quit smoking about 7 years ago. She has never used smokeless tobacco. She reports that she does not use drugs. No history on file for alcohol use.  Allergies:  Allergies  Allergen Reactions   Codeine Swelling    Facial swelling   [place] [Fish Allergy] Anaphylaxis and Swelling   Penicillins Nausea And Vomiting   Aspirin Tinitus    If she takes too much    Medications: I have reviewed the patient's current medications.  Results for orders placed or performed during the hospital encounter of 08/30/22 (from the past 48 hour(s))  Protime-INR     Status: None   Collection Time: 08/30/22  1:00 PM  Result Value Ref Range   Prothrombin Time 13.4 11.4 - 15.2 seconds   INR 1.0 0.8 - 1.2    Comment: (NOTE) INR goal varies based on device and disease states. Performed at [facility], [place] [address], [place], [place] [address]   Type and screen [place]     Status: None (Preliminary result)   Collection Time: 08/30/22  1:00 PM  Result Value Ref Range   ABO/RH(D) O POS    Antibody Screen NEG    Sample Expiration 09/02/2022,2359    Unit Number [id number]    Blood Component Type RED CELLS,LR    Unit division 00    Status of Unit ISSUED    Transfusion Status OK TO TRANSFUSE    Crossmatch Result      Compatible Performed at [facility], [place] [address], [place], [place] [address]   CBC with Differential/Platelet     Status: Abnormal   Collection Time: 08/30/22  1:50 PM   Result Value Ref Range   WBC 11.1 (H) 4.0 - 10.5 K/uL   RBC 3.19 (L) 3.87 - 5.11 MIL/uL   Hemoglobin 9.5 (L) 12.0 - 15.0 g/dL   HCT 31.8 (L) 36.0 - 46.0 %   MCV 99.7 80.0 - 100.0 fL   MCH 29.8 26.0 - 34.0 pg   MCHC 29.9 (L) 30.0 - 36.0 g/dL   RDW 13.4 11.5 - 15.5 %   Platelets 149 (L) 150 - 400 K/uL   nRBC 0.0 0.0 - 0.2 %   Neutrophils Relative % 92 %   Neutro Abs 10.2 (H) 1.7 - 7.7 K/uL   Lymphocytes Relative 3 %   Lymphs Abs 0.4 (L) 0.7 - 4.0 K/uL   Monocytes Relative 4 %   Monocytes Absolute 0.4 0.1 - 1.0 K/uL   Eosinophils Relative 0 %   Eosinophils Absolute 0.0 0.0 - 0.5 K/uL   Basophils Relative 0 %   Basophils Absolute 0.0 0.0 - 0.1 K/uL   Immature Granulocytes 1 %   Abs Immature Granulocytes 0.06 0.00 - 0.07 K/uL    Comment: Performed at [facility], [place] [address], [place], [place] [address]  Comprehensive metabolic panel     Status: Abnormal   Collection Time: 08/30/22  2:00 PM  Result Value Ref Range   Sodium 139 135 - 145 mmol/L   Potassium 5.0 3.5 - 5.1 mmol/L   Chloride 95 (L) 98 - 111 mmol/L   CO2 32 22 - 32 mmol/L   Glucose, Bld 113 (H) 70 - 99 mg/dL    Comment: Glucose reference range applies only to samples taken after fasting for at least 8 hours.   BUN 57 (H) 8 - 23 mg/dL   Creatinine, Ser 0.82 0.44 - 1.00 mg/dL   Calcium 9.4 8.9 - 10.3 mg/dL   Total Protein 6.6 6.5 - 8.1 g/dL   Albumin 3.6 3.5 - 5.0 g/dL   AST 24 15 - 41 U/L   ALT 19 0 - 44 U/L   Alkaline Phosphatase 87 38 - 126 U/L   Total Bilirubin 0.7 0.3 - 1.2 mg/dL   GFR, Estimated >60 >60 mL/min    Comment: (NOTE) Calculated using the CKD-EPI Creatinine Equation (2021)    Anion gap 12 5 - 15    Comment: Performed at [place] [address] [place], [place] [address]  Lipase, blood     Status: None   Collection Time: 08/30/22  2:00 PM  Result Value Ref Range   Lipase 35 11 - 51 U/L    Comment: Performed at [facility], [place] [address] [place], [place] [address]   CBC with Differential     Status: Abnormal   Collection Time: 08/30/22  5:00 PM  Result Value Ref Range   WBC 10.5 4.0 - 10.5 K/uL   RBC 2.42 (L) 3.87 - 5.11 MIL/uL   Hemoglobin  7.5 (L) 12.0 - 15.0 g/dL   HCT 23.5 (L) 36.0 - 46.0 %   MCV 97.1 80.0 - 100.0 fL   MCH 31.0 26.0 - 34.0 pg   MCHC 31.9 30.0 - 36.0 g/dL   RDW 13.4 11.5 - 15.5 %   Platelets 136 (L) 150 - 400 K/uL   nRBC 0.0 0.0 - 0.2 %   Neutrophils Relative % 86 %   Neutro Abs 9.0 (H) 1.7 - 7.7 K/uL   Lymphocytes Relative 5 %   Lymphs Abs 0.5 (L) 0.7 - 4.0 K/uL   Monocytes Relative 8 %   Monocytes Absolute 0.8 0.1 - 1.0 K/uL   Eosinophils Relative 0 %   Eosinophils Absolute 0.0 0.0 - 0.5 K/uL   Basophils Relative 0 %   Basophils Absolute 0.0 0.0 - 0.1 K/uL   Immature Granulocytes 1 %   Abs Immature Granulocytes 0.08 (H) 0.00 - 0.07 K/uL    Comment: Performed at [facility] [address], [place], [place] [address]  Prepare RBC (crossmatch)     Status: None   Collection Time: 08/30/22  7:03 PM  Result Value Ref Range   Order Confirmation      ORDER PROCESSED BY BLOOD BANK Performed at [facility], [place] [address], [place], [place] [address]   Comprehensive metabolic panel     Status: Abnormal   Collection Time: 08/31/22  1:02 AM  Result Value Ref Range   Sodium 143 135 - 145 mmol/L   Potassium 4.8 3.5 - 5.1 mmol/L   Chloride 105 98 - 111 mmol/L   CO2 31 22 - 32 mmol/L   Glucose, Bld 124 (H) 70 - 99 mg/dL    Comment: Glucose reference range applies only to samples taken after fasting for at least 8 hours.   BUN 68 (H) 8 - 23 mg/dL   Creatinine, Ser 0.75 0.44 - 1.00 mg/dL   Calcium 8.8 (L) 8.9 - 10.3 mg/dL   Total Protein 5.2 (L) 6.5 - 8.1 g/dL   Albumin 2.9 (L) 3.5 - 5.0 g/dL   AST 19 15 - 41 U/L   ALT 15 0 - 44 U/L   Alkaline Phosphatase 64 38 - 126 U/L   Total Bilirubin 0.7 0.3 - 1.2 mg/dL   GFR, Estimated >60 >60 mL/min    Comment: (NOTE) Calculated using the CKD-EPI Creatinine Equation  (2021)    Anion gap 7 5 - 15    Comment: Performed at [facility], [place] [address], [place], [place] [address]  CBC     Status: Abnormal   Collection Time: 08/31/22  1:02 AM  Result Value Ref Range   WBC 12.6 (H) 4.0 - 10.5 K/uL   RBC 3.03 (L) 3.87 - 5.11 MIL/uL   Hemoglobin 9.0 (L) 12.0 - 15.0 g/dL   HCT 28.0 (L) 36.0 - 46.0 %   MCV 92.4 80.0 - 100.0 fL   MCH 29.7 26.0 - 34.0 pg   MCHC 32.1 30.0 - 36.0 g/dL   RDW 15.9 (H) 11.5 - 15.5 %   Platelets 115 (L) 150 - 400 K/uL   nRBC 0.0 0.0 - 0.2 %    Comment: Performed at [facility], [place] [address] [place], [place] [address]    DG Chest 2 View  Result Date: 08/30/2022 CLINICAL DATA:  SOB chronic cough EXAM: CHEST - 2 VIEW COMPARISON:  07/21/2020. FINDINGS: The heart size and mediastinal contours are within normal limits. Lungs are hyperinflated  suggesting COPD. There is no focal consolidation. No pneumothorax or pleural effusion. Aorta is calcified. The visualized skeletal structures are unremarkable. IMPRESSION: Findings suggest COPD.  Otherwise no active cardiopulmonary disease. Electronically Signed   By: Sammie Bench M.D.   On: 08/30/2022 15:55    ROS negative except above Blood pressure (!) 114/94, pulse (!) 101, temperature 98.4 F (36.9 C), temperature source Oral, resp. rate 16, height '5\' 3"'$  (1.6 m), weight 54.4 kg, SpO2 100 %. Physical Exam vital signs stable afebrile no acute distress in okay spirits abdomen is soft nontender BUN actually increased she was not getting IV fluids on the floor but supposedly did in the emergency room hemoglobin increased with transfusion  Assessment/Plan: Upper GI bleeding Plan: We discussed her arthritis pills and her alcohol and the risk benefits methods of endoscopy was discussed with the patient and we will proceed today with further workup and plans pending those findings and we will decide what diet she can have since she is thirsty and hungry after the  procedure  [facility] E 08/31/2022, 11:18 AM

## 2022-08-31 NOTE — Care Management (Signed)
  Transition of Care [facility]) Screening Note   Patient Details  Name: Kelly Phillips Date of Birth: 1945-12-31   Transition of Care [facility]) CM/SW Contact:    Carles Collet, RN Phone Number: 08/31/2022, 2:08 PM    Transition of Care Department [facility]) has reviewed patient and We will continue to monitor patient advancement through interdisciplinary progression rounds. If new patient transition needs arise, please place a TOC consult.

## 2022-08-31 NOTE — Anesthesia Postprocedure Evaluation (Signed)
Anesthesia Post Note  Patient: Bri Wakeman  Procedure(s) Performed: ESOPHAGOGASTRODUODENOSCOPY (EGD) WITH PROPOFOL BIOPSY     Patient location during evaluation: Endoscopy Anesthesia Type: MAC Level of consciousness: awake Pain management: pain level controlled Vital Signs Assessment: post-procedure vital signs reviewed and stable Respiratory status: spontaneous breathing, nonlabored ventilation and respiratory function stable Cardiovascular status: blood pressure returned to baseline and stable Postop Assessment: no apparent nausea or vomiting Anesthetic complications: no   No notable events documented.  Last Vitals:  Vitals:   08/31/22 1340 08/31/22 1350  BP: 118/74 119/60  Pulse:    Resp: 20 20  Temp:    SpO2: 94% 94%    Last Pain:  Vitals:   08/31/22 1350  TempSrc:   PainSc: 0-No pain                 Juell Radney P Kenika Sahm

## 2022-08-31 NOTE — Progress Notes (Signed)
PROGRESS NOTE  Kelly Phillips ZOX:[id number] DOB: 1946/02/06 DOA: 08/30/2022 PCP: Emmaline Kluver, MD   LOS: 1 day   Brief Narrative / Interim history: 77 year old female with prior GI bleed due to PUD, PAD, mesenteric artery stenosis, CAD, COPD, chronic hypoxic respiratory failure, legal blindness comes into the hospital with hematemesis.  This was associated with epigastric pain.  Caregiver witnessed some blood in the vomit, patient is blind but could feel the taste of blood.  GI consulted and she was admitted to the hospital  Subjective / 24h Interval events: Doing well this morning, awaiting GI input  Assesement and Plan: Principal Problem:   Acute upper GI bleeding Active Problems:   Chronic respiratory failure with hypoxia (HCC)   GERD without esophagitis   Peripheral vascular disease, unspecified ([place])   Status post aortobifemoral bypass surgery   Benign essential hypertension   Bilateral carotid artery stenosis   Superior mesenteric artery stenosis (HCC)   COPD (chronic obstructive pulmonary disease) with emphysema ([place])   Principal problem Upper GI bleed - patient with history of PUD, most recent endoscopy in 2021.  She is drinking EtOH daily, although small amount, but using NSAIDs every day.  To undergo upper endoscopy by GI.  Appreciate follow-up.  Active problems Acute blood loss anemia - continue to monitor hemoglobin, already transfuse unit of packed red blood cells  PAD, CAD, mesenteric artery stenosis - status post aortofemoral bypass.  Hold home aspirin for now  Macular degeneration, legal blindness - noted  COPD with chronic hypoxic respiratory failure - respiratory status is at baseline.  No wheezing  Scheduled Meds:  ALPRAZolam  0.25 mg Oral q morning   ALPRAZolam  0.5 mg Oral QHS   budesonide (PULMICORT) nebulizer solution  0.25 mg Nebulization BID   gabapentin  300 mg Oral Q1500   gabapentin  600 mg Oral QHS   pantoprazole (PROTONIX) IV  40 mg  Intravenous Q12H   sodium chloride flush  3 mL Intravenous Q12H   Continuous Infusions: PRN Meds:.acetaminophen **OR** acetaminophen, ipratropium-albuterol, ondansetron (ZOFRAN) IV, polyethylene glycol  Current Outpatient Medications  Medication Instructions   ALPRAZolam (XANAX) 0.25-0.5 mg, Oral, See admin instructions, Take 1/2 tablet (0.25 mg) by mouth every morning and 1 tablet (0.5 mg) at night   amLODipine-benazepril (LOTREL) 5-10 MG capsule 1 capsule, Oral, Daily at bedtime   aspirin EC 81 mg, Oral, Daily at bedtime, Swallow whole.   baclofen (LIORESAL) 10 mg, Oral, 2 times daily PRN   docusate sodium (COLACE) 100-200 mg, Oral, See admin instructions, Take one capsule (100 mg) by mouth every other night at bedtime, take two capsules (200 mg) on alternate nights   fluticasone (FLOVENT HFA) 110 MCG/ACT inhaler 2 puffs, Inhalation, Daily   furosemide (LASIX) 20 mg, Oral, Daily PRN   gabapentin (NEURONTIN) 300-600 mg, Oral, See admin instructions, Take one capsule (300 mg) by mouth after lunch and two capsules (600 mg) at night   ipratropium-albuterol (DUONEB) 0.5-2.5 (3) MG/3ML SOLN 3 mLs, Nebulization, Every 6 hours PRN   meloxicam (MOBIC) 15 mg, Oral, Daily PRN   OXYGEN 3 L, Inhalation, Continuous   pantoprazole (PROTONIX) 40 mg, Oral, Daily   polyethylene glycol (MIRALAX / GLYCOLAX) 17 g, Oral, Daily PRN    Diet Orders (From admission, onward)     Start     Ordered   08/31/22 1210  Diet NPO time specified  Diet effective now        08/31/22 1209  DVT prophylaxis: SCDs Start: 08/30/22 1621   Lab Results  Component Value Date   PLT 115 (L) 08/31/2022      Code Status: DNR  Family Communication: no family at bedside   Status is: Inpatient Remains inpatient appropriate because: GI bleed   Level of care: Progressive  Consultants:  GI  Objective: Vitals:   08/31/22 1326 08/31/22 1330 08/31/22 1340 08/31/22 1350  BP: (!) 112/58 115/65 118/74 119/60   Pulse:      Resp:  '20 20 20  '$ Temp:      TempSrc:      SpO2: 96% 96% 94% 94%  Weight:      Height:        Intake/Output Summary (Last 24 hours) at 08/31/2022 1402 Last data filed at 08/31/2022 1317 Gross per 24 hour  Intake 1224.5 ml  Output --  Net 1224.5 ml   Wt Readings from Last 3 Encounters:  08/30/22 54.4 kg  02/24/20 56.7 kg  10/28/19 58 kg    Examination:  Constitutional: NAD Eyes: no scleral icterus ENMT: Mucous membranes are moist.  Neck: normal, supple Respiratory: clear to auscultation bilaterally, no wheezing, no crackles. Normal respiratory effort. No accessory muscle use.  Cardiovascular: Regular rate and rhythm, no murmurs / rubs / gallops. No LE edema.  Abdomen: non distended, no tenderness. Bowel sounds positive.  Musculoskeletal: no clubbing / cyanosis.    Data Reviewed: I have independently reviewed following labs and imaging studies  CBC Recent Labs  Lab 08/30/22 1350 08/30/22 1700 08/31/22 0102  WBC 11.1* 10.5 12.6*  HGB 9.5* 7.5* 9.0*  HCT 31.8* 23.5* 28.0*  PLT 149* 136* 115*  MCV 99.7 97.1 92.4  MCH 29.8 31.0 29.7  MCHC 29.9* 31.9 32.1  RDW 13.4 13.4 15.9*  LYMPHSABS 0.4* 0.5*  --   MONOABS 0.4 0.8  --   EOSABS 0.0 0.0  --   BASOSABS 0.0 0.0  --     Recent Labs  Lab 08/30/22 1300 08/30/22 1400 08/31/22 0102  NA  --  139 143  K  --  5.0 4.8  CL  --  95* 105  CO2  --  32 31  GLUCOSE  --  113* 124*  BUN  --  57* 68*  CREATININE  --  0.82 0.75  CALCIUM  --  9.4 8.8*  AST  --  24 19  ALT  --  19 15  ALKPHOS  --  87 64  BILITOT  --  0.7 0.7  ALBUMIN  --  3.6 2.9*  INR 1.0  --   --     ------------------------------------------------------------------------------------------------------------------ No results for input(s): "CHOL", "HDL", "LDLCALC", "TRIG", "CHOLHDL", "LDLDIRECT" in the last 72 hours.  Lab Results  Component Value Date   HGBA1C 6.1 (H) 05/17/2015    ------------------------------------------------------------------------------------------------------------------ No results for input(s): "TSH", "T4TOTAL", "T3FREE", "THYROIDAB" in the last 72 hours.  Invalid input(s): "FREET3"  Cardiac Enzymes No results for input(s): "CKMB", "TROPONINI", "MYOGLOBIN" in the last 168 hours.  Invalid input(s): "CK" ------------------------------------------------------------------------------------------------------------------    Component Value Date/Time   BNP 440.9 (H) 05/14/2015 0553    CBG: No results for input(s): "GLUCAP" in the last 168 hours.  No results found for this or any previous visit (from the past 240 hour(s)).   Radiology Studies: DG Chest 2 View  Result Date: 08/30/2022 CLINICAL DATA:  SOB chronic cough EXAM: CHEST - 2 VIEW COMPARISON:  07/21/2020. FINDINGS: The heart size and mediastinal contours are within normal limits. Lungs are hyperinflated suggesting  COPD. There is no focal consolidation. No pneumothorax or pleural effusion. Aorta is calcified. The visualized skeletal structures are unremarkable. IMPRESSION: Findings suggest COPD.  Otherwise no active cardiopulmonary disease. Electronically Signed   By: Sammie Bench M.D.   On: 08/30/2022 15:55     Marzetta Board, MD, PhD [facility]  Between 7 am - 7 pm I am available, please contact me via Amion (for emergencies) or Securechat (non urgent messages)  Between 7 pm - 7 am I am not available, please contact night coverage MD/APP via Amion

## 2022-08-31 NOTE — ED Notes (Signed)
Patient moved into hospital bed, she states she is resting more comfortably now.

## 2022-08-31 NOTE — Anesthesia Preprocedure Evaluation (Addendum)
Anesthesia Evaluation  Patient identified by MRN, date of birth, ID band Patient awake    Reviewed: Allergy & Precautions, NPO status , Patient's Chart, lab work & pertinent test results  Airway Mallampati: III  TM Distance: >3 FB Neck ROM: Full    Dental no notable dental hx.    Pulmonary COPD (3.5 L [place]),  COPD inhaler and oxygen dependent, former smoker   Pulmonary exam normal        Cardiovascular hypertension, Pt. on medications + Peripheral Vascular Disease  Normal cardiovascular exam     Neuro/Psych negative neurological ROS     GI/Hepatic negative GI ROS, Neg liver ROS,,,  Endo/Other  negative endocrine ROS    Renal/GU negative Renal ROS     Musculoskeletal  (+) Arthritis ,    Abdominal   Peds  Hematology  (+) Blood dyscrasia, anemia   Anesthesia Other Findings Upper GI bleed  Reproductive/Obstetrics                             Anesthesia Physical Anesthesia Plan  ASA: 4  Anesthesia Plan: MAC   Post-op Pain Management:    Induction: Intravenous  PONV Risk Score and Plan: 2 and Propofol infusion and Treatment may vary due to age or medical condition  Airway Management Planned: Nasal Cannula  Additional Equipment:   Intra-op Plan:   Post-operative Plan:   Informed Consent: I have reviewed the patients History and Physical, chart, labs and discussed the procedure including the risks, benefits and alternatives for the proposed anesthesia with the patient or authorized representative who has indicated his/her understanding and acceptance.   Patient has DNR.  Discussed DNR with patient and Continue DNR.   Dental advisory given  Plan Discussed with: CRNA  Anesthesia Plan Comments: (Patient request no chest compressions)       Anesthesia Quick Evaluation

## 2022-08-31 NOTE — ED Notes (Addendum)
Repeat CBC collected and sent to main lab '@0104'$ 

## 2022-08-31 NOTE — Transfer of Care (Signed)
Immediate Anesthesia Transfer of Care Note  Patient: Kelly Phillips  Procedure(s) Performed: ESOPHAGOGASTRODUODENOSCOPY (EGD) WITH PROPOFOL BIOPSY  Patient Location: Endoscopy Unit  Anesthesia Type:MAC  Level of Consciousness: drowsy and patient cooperative  Airway & Oxygen Therapy: Patient Spontanous Breathing and Patient connected to nasal cannula oxygen  Post-op Assessment: Report given to RN and Post -op Vital signs reviewed and stable  Post vital signs: Reviewed and stable  Last Vitals:  Vitals Value Taken Time  BP    Temp    Pulse    Resp    SpO2      Last Pain:  Vitals:   08/31/22 1221  TempSrc: Temporal  PainSc: 0-No pain         Complications: No notable events documented.

## 2022-08-31 NOTE — Op Note (Signed)
[facility] Patient Name: Kelly Phillips Procedure Date : 08/31/2022 MRN: [id number] Attending MD: Clarene Essex , MD, [id number] Date of Birth: 07-Jul-1946 CSN: [id number] Age: 77 Admit Type: Inpatient Procedure:                Upper GI endoscopy Indications:              Acute post hemorrhagic anemia, Coffee-ground                            emesis, Hematemesis Providers:                Clarene Essex, MD, Carlyn Reichert, RN, Brien Mates,                            Technician Referring MD:              Medicines:                Monitored Anesthesia Care Complications:            No immediate complications. Estimated Blood Loss:     Estimated blood loss: none. Procedure:                Pre-Anesthesia Assessment:                           - Prior to the procedure, a History and Physical                            was performed, and patient medications and                            allergies were reviewed. The patient's tolerance of                            previous anesthesia was also reviewed. The risks                            and benefits of the procedure and the sedation                            options and risks were discussed with the patient.                            All questions were answered, and informed consent                            was obtained. Prior Anticoagulants: The patient has                            taken no anticoagulant or antiplatelet agents                            except for NSAID medication. ASA Grade Assessment:                            III - A  patient with severe systemic disease. After                            reviewing the risks and benefits, the patient was                            deemed in satisfactory condition to undergo the                            procedure.                           After obtaining informed consent, the endoscope was                            passed under direct vision. Throughout the                             procedure, the patient's blood pressure, pulse, and                            oxygen saturations were monitored continuously. The                            [id number]-H190 ([id number] Olympus endoscope was introduced                            through the mouth, and advanced to the second part                            of duodenum. The upper GI endoscopy was                            accomplished without difficulty. The patient                            tolerated the procedure well. Scope In: Scope Out: Findings:      A small hiatal hernia was present.      A large amount of Probably some food and old blood and basically sludge       looking probably some pill was found in the gastric fundus, in the       gastric body and on the greater curvature of the stomach.      One non-bleeding cratered and linear gastric ulcer with no stigmata of       bleeding was found in the prepyloric region of the stomach. There was       some pyloric stenosis from the ulcer but we were able to get the scope       easily passed      The gastric fundus and gastric antrum were normal. Biopsies were taken       with a cold forceps for histology.      The duodenal bulb, first portion of the duodenum and second portion of       the duodenum were normal.      The exam was otherwise without abnormality. Impression:               -  Small hiatal hernia.                           - A large amount of Probably some food and old                            blood and basically sludge looking probably some                            pill in the stomach.                           - Non-bleeding gastric ulcer with no stigmata of                            bleeding.                           - Normal gastric fundus and antrum. Biopsied.                           - Normal duodenal bulb, first portion of the                            duodenum and second portion of the duodenum.                           - The  examination was otherwise normal. Recommendation:           - Clear liquid diet today. Maybe Tuesday if doing                            well slowly advance diet                           - Continue present medications.                           - No aspirin, ibuprofen, naproxen, or other                            non-steroidal anti-inflammatory drugs Long-term and                            including Mobic or meloxicam as well.                           - Use Protonix (pantoprazole) 40 mg PO daily                            indefinitely. You had been on this in the past but                            we do not believe you have been taking it recently                           -  Await pathology results.                           - Return to GI clinic in 3 weeks.                           - Telephone GI clinic for pathology results in 1                            week.                           - Telephone GI clinic if symptomatic PRN.                           - Repeat upper endoscopy PRN to check healing                            possibly due to pyloric dilation or possibly to                            evaluate the fundus and body that was not seen if                            signs of ongoing bleeding although expect                            hemoglobin to drop in BUN to decrease in a few days                            of melena based on findings in the stomach and her                            current lab profile. Procedure Code(s):        --- Professional ---                           [phone], Esophagogastroduodenoscopy, flexible,                            transoral; with biopsy, single or multiple Diagnosis Code(s):        --- Professional ---                           K44.9, Diaphragmatic hernia without obstruction or                            gangrene                           T18.2XXA, Foreign body in stomach, initial encounter                           K25.9, Gastric  ulcer, unspecified as acute or  chronic, without hemorrhage or perforation                           D62, Acute posthemorrhagic anemia                           K92.0, Hematemesis CPT copyright 2022 [employer]. All rights reserved. The codes documented in this report are preliminary and upon coder review may  be revised to meet current compliance requirements. Clarene Essex, MD 08/31/2022 1:35:45 PM This report has been signed electronically. Number of Addenda: 0

## 2022-09-01 ENCOUNTER — Inpatient Hospital Stay (HOSPITAL_COMMUNITY): Payer: Medicare HMO

## 2022-09-01 DIAGNOSIS — K922 Gastrointestinal hemorrhage, unspecified: Secondary | ICD-10-CM | POA: Diagnosis not present

## 2022-09-01 LAB — BASIC METABOLIC PANEL
Anion gap: 9 (ref 5–15)
BUN: 40 mg/dL — ABNORMAL HIGH (ref 8–23)
CO2: 24 mmol/L (ref 22–32)
Calcium: 8.5 mg/dL — ABNORMAL LOW (ref 8.9–10.3)
Chloride: 106 mmol/L (ref 98–111)
Creatinine, Ser: 0.69 mg/dL (ref 0.44–1.00)
GFR, Estimated: >60 mL/min (ref >60)
Glucose, Bld: 94 mg/dL (ref 70–99)
Potassium: 4.8 mmol/L (ref 3.5–5.1)
Sodium: 139 mmol/L (ref 135–145)

## 2022-09-01 LAB — CBC
HCT: 24.9 % — ABNORMAL LOW (ref 36.0–46.0)
Hemoglobin: 7.6 g/dL — ABNORMAL LOW (ref 12.0–15.0)
MCH: 29.7 pg (ref 26.0–34.0)
MCHC: 30.5 g/dL (ref 30.0–36.0)
MCV: 97.3 fL (ref 80.0–100.0)
Platelets: 112 10*3/uL — ABNORMAL LOW (ref 150–400)
RBC: 2.56 MIL/uL — ABNORMAL LOW (ref 3.87–5.11)
RDW: 15.6 % — ABNORMAL HIGH (ref 11.5–15.5)
WBC: 12.5 10*3/uL — ABNORMAL HIGH (ref 4.0–10.5)
nRBC: 0.2 % (ref 0.0–0.2)

## 2022-09-01 NOTE — Progress Notes (Signed)
Subjective: Patient has only had water to drink and complains of soreness in abdomen.  She is not ready for diet to be advanced.  She has not been out of bed to chair and reports feeling weak.  Objective: Vital signs in last 24 hours: Temp:  [97.4 F (36.3 C)-98.1 F (36.7 C)] 98.1 F (36.7 C) (01/08 0800) Pulse Rate:  [65-98] 65 (01/08 0800) Resp:  [16-25] 25 (01/08 0800) BP: (59-119)/(39-86) 111/65 (01/08 0800) SpO2:  [88 %-100 %] 99 % (01/08 0800) Weight change:  Last BM Date : 08/31/21  PE: Sitting up on bed, prominent pallor GENERAL: Appears comfortable  ABDOMEN: Diffuse abdominal tenderness, normoactive bowel sounds EXTREMITIES: No deformity  Lab Results: Results for orders placed or performed during the hospital encounter of 08/30/22 (from the past 48 hour(s))  Protime-INR     Status: None   Collection Time: 08/30/22  1:00 PM  Result Value Ref Range   Prothrombin Time 13.4 11.4 - 15.2 seconds   INR 1.0 0.8 - 1.2    Comment: (NOTE) INR goal varies based on device and disease states. Performed at [facility], [place] [address], [place], [place] [address]   Type and screen [place]     Status: None   Collection Time: 08/30/22  1:00 PM  Result Value Ref Range   ABO/RH(D) O POS    Antibody Screen NEG    Sample Expiration 09/02/2022,2359    Unit Number [id number]    Blood Component Type RED CELLS,LR    Unit division 00    Status of Unit ISSUED,FINAL    Transfusion Status OK TO TRANSFUSE    Crossmatch Result      Compatible Performed at [facility], [place] [address], [place], [place] [address]   CBC with Differential/Platelet     Status: Abnormal   Collection Time: 08/30/22  1:50 PM  Result Value Ref Range   WBC 11.1 (H) 4.0 - 10.5 K/uL   RBC 3.19 (L) 3.87 - 5.11 MIL/uL   Hemoglobin 9.5 (L) 12.0 - 15.0 g/dL   HCT 31.8 (L) 36.0 - 46.0 %   MCV 99.7 80.0 - 100.0 fL   MCH 29.8 26.0 - 34.0 pg   MCHC 29.9 (L) 30.0 - 36.0 g/dL   RDW  13.4 11.5 - 15.5 %   Platelets 149 (L) 150 - 400 K/uL   nRBC 0.0 0.0 - 0.2 %   Neutrophils Relative % 92 %   Neutro Abs 10.2 (H) 1.7 - 7.7 K/uL   Lymphocytes Relative 3 %   Lymphs Abs 0.4 (L) 0.7 - 4.0 K/uL   Monocytes Relative 4 %   Monocytes Absolute 0.4 0.1 - 1.0 K/uL   Eosinophils Relative 0 %   Eosinophils Absolute 0.0 0.0 - 0.5 K/uL   Basophils Relative 0 %   Basophils Absolute 0.0 0.0 - 0.1 K/uL   Immature Granulocytes 1 %   Abs Immature Granulocytes 0.06 0.00 - 0.07 K/uL    Comment: Performed at [facility], [place] [address], [place], [place] [address]  Comprehensive metabolic panel     Status: Abnormal   Collection Time: 08/30/22  2:00 PM  Result Value Ref Range   Sodium 139 135 - 145 mmol/L   Potassium 5.0 3.5 - 5.1 mmol/L   Chloride 95 (L) 98 - 111 mmol/L   CO2 32 22 - 32 mmol/L   Glucose, Bld 113 (H) 70 - 99 mg/dL    Comment: Glucose reference  range applies only to samples taken after fasting for at least 8 hours.   BUN 57 (H) 8 - 23 mg/dL   Creatinine, Ser 0.82 0.44 - 1.00 mg/dL   Calcium 9.4 8.9 - 10.3 mg/dL   Total Protein 6.6 6.5 - 8.1 g/dL   Albumin 3.6 3.5 - 5.0 g/dL   AST 24 15 - 41 U/L   ALT 19 0 - 44 U/L   Alkaline Phosphatase 87 38 - 126 U/L   Total Bilirubin 0.7 0.3 - 1.2 mg/dL   GFR, Estimated >60 >60 mL/min    Comment: (NOTE) Calculated using the CKD-EPI Creatinine Equation (2021)    Anion gap 12 5 - 15    Comment: Performed at [facility] [address] [place], [place] [address]  Lipase, blood     Status: None   Collection Time: 08/30/22  2:00 PM  Result Value Ref Range   Lipase 35 11 - 51 U/L    Comment: Performed at [facility], [place] [address], [place], [place] [address]  CBC with Differential     Status: Abnormal   Collection Time: 08/30/22  5:00 PM  Result Value Ref Range   WBC 10.5 4.0 - 10.5 K/uL   RBC 2.42 (L) 3.87 - 5.11 MIL/uL   Hemoglobin 7.5 (L) 12.0 - 15.0 g/dL   HCT 23.5 (L) 36.0 - 46.0 %   MCV 97.1 80.0  - 100.0 fL   MCH 31.0 26.0 - 34.0 pg   MCHC 31.9 30.0 - 36.0 g/dL   RDW 13.4 11.5 - 15.5 %   Platelets 136 (L) 150 - 400 K/uL   nRBC 0.0 0.0 - 0.2 %   Neutrophils Relative % 86 %   Neutro Abs 9.0 (H) 1.7 - 7.7 K/uL   Lymphocytes Relative 5 %   Lymphs Abs 0.5 (L) 0.7 - 4.0 K/uL   Monocytes Relative 8 %   Monocytes Absolute 0.8 0.1 - 1.0 K/uL   Eosinophils Relative 0 %   Eosinophils Absolute 0.0 0.0 - 0.5 K/uL   Basophils Relative 0 %   Basophils Absolute 0.0 0.0 - 0.1 K/uL   Immature Granulocytes 1 %   Abs Immature Granulocytes 0.08 (H) 0.00 - 0.07 K/uL    Comment: Performed at [facility], [address], [place], [place] [address]  Prepare RBC (crossmatch)     Status: None   Collection Time: 08/30/22  7:03 PM  Result Value Ref Range   Order Confirmation      ORDER PROCESSED BY BLOOD BANK Performed at [facility], [place] [address], [place], [place] [address]   Comprehensive metabolic panel     Status: Abnormal   Collection Time: 08/31/22  1:02 AM  Result Value Ref Range   Sodium 143 135 - 145 mmol/L   Potassium 4.8 3.5 - 5.1 mmol/L   Chloride 105 98 - 111 mmol/L   CO2 31 22 - 32 mmol/L   Glucose, Bld 124 (H) 70 - 99 mg/dL    Comment: Glucose reference range applies only to samples taken after fasting for at least 8 hours.   BUN 68 (H) 8 - 23 mg/dL   Creatinine, Ser 0.75 0.44 - 1.00 mg/dL   Calcium 8.8 (L) 8.9 - 10.3 mg/dL   Total Protein 5.2 (L) 6.5 - 8.1 g/dL   Albumin 2.9 (L) 3.5 - 5.0 g/dL   AST 19 15 - 41 U/L   ALT 15 0 - 44 U/L  Alkaline Phosphatase 64 38 - 126 U/L   Total Bilirubin 0.7 0.3 - 1.2 mg/dL   GFR, Estimated >60 >60 mL/min    Comment: (NOTE) Calculated using the CKD-EPI Creatinine Equation (2021)    Anion gap 7 5 - 15    Comment: Performed at [facility] [address], [place], [place] [address]  CBC     Status: Abnormal   Collection Time: 08/31/22  1:02 AM  Result Value Ref Range   WBC 12.6 (H) 4.0 - 10.5 K/uL   RBC 3.03 (L)  3.87 - 5.11 MIL/uL   Hemoglobin 9.0 (L) 12.0 - 15.0 g/dL   HCT 28.0 (L) 36.0 - 46.0 %   MCV 92.4 80.0 - 100.0 fL   MCH 29.7 26.0 - 34.0 pg   MCHC 32.1 30.0 - 36.0 g/dL   RDW 15.9 (H) 11.5 - 15.5 %   Platelets 115 (L) 150 - 400 K/uL   nRBC 0.0 0.0 - 0.2 %    Comment: Performed at [facility], [place] [address] [place], [place] [address]  CBC     Status: Abnormal   Collection Time: 08/31/22  2:31 PM  Result Value Ref Range   WBC 12.3 (H) 4.0 - 10.5 K/uL   RBC 2.73 (L) 3.87 - 5.11 MIL/uL   Hemoglobin 8.0 (L) 12.0 - 15.0 g/dL   HCT 26.1 (L) 36.0 - 46.0 %   MCV 95.6 80.0 - 100.0 fL   MCH 29.3 26.0 - 34.0 pg   MCHC 30.7 30.0 - 36.0 g/dL   RDW 16.0 (H) 11.5 - 15.5 %   Platelets 113 (L) 150 - 400 K/uL   nRBC 0.0 0.0 - 0.2 %    Comment: Performed at [facility], [place] [address], [place], [place] [address]  Basic metabolic panel     Status: Abnormal   Collection Time: 09/01/22  3:24 AM  Result Value Ref Range   Sodium 139 135 - 145 mmol/L   Potassium 4.8 3.5 - 5.1 mmol/L   Chloride 106 98 - 111 mmol/L   CO2 24 22 - 32 mmol/L   Glucose, Bld 94 70 - 99 mg/dL    Comment: Glucose reference range applies only to samples taken after fasting for at least 8 hours.   BUN 40 (H) 8 - 23 mg/dL   Creatinine, Ser 0.69 0.44 - 1.00 mg/dL   Calcium 8.5 (L) 8.9 - 10.3 mg/dL   GFR, Estimated >60 >60 mL/min    Comment: (NOTE) Calculated using the CKD-EPI Creatinine Equation (2021)    Anion gap 9 5 - 15    Comment: Performed at [facility] [address] [place], [place] [address]  CBC     Status: Abnormal   Collection Time: 09/01/22  6:20 AM  Result Value Ref Range   WBC 12.5 (H) 4.0 - 10.5 K/uL   RBC 2.56 (L) 3.87 - 5.11 MIL/uL   Hemoglobin 7.6 (L) 12.0 - 15.0 g/dL   HCT 24.9 (L) 36.0 - 46.0 %   MCV 97.3 80.0 - 100.0 fL   MCH 29.7 26.0 - 34.0 pg   MCHC 30.5 30.0 - 36.0 g/dL   RDW 15.6 (H) 11.5 - 15.5 %   Platelets 112 (L) 150 - 400 K/uL   nRBC 0.2 0.0 - 0.2 %    Comment:  Performed at [facility] [address] [place], [place] [address]    Studies/Results: DG CHEST PORT 1 VIEW  Result Date: 09/01/2022 CLINICAL DATA:  Dyspnea EXAM: PORTABLE CHEST 1 VIEW COMPARISON:  08/30/2022 FINDINGS: Lungs are hyperinflated as can be seen with COPD. Right upper lobe pulmonary nodule seen on the CT chest dated 03/28/2022 is not well visualized. No focal consolidation. No pleural effusion or pneumothorax. Heart and mediastinal contours are unremarkable. No acute osseous abnormality. IMPRESSION: 1. No acute cardiopulmonary disease. 2. Right upper lobe pulmonary nodule seen on the CT chest dated 03/28/2022 is not well visualized. Electronically Signed   By: Kathreen Devoid M.D.   On: 09/01/2022 10:00   DG Chest 2 View  Result Date: 08/30/2022 CLINICAL DATA:  SOB chronic cough EXAM: CHEST - 2 VIEW COMPARISON:  07/21/2020. FINDINGS: The heart size and mediastinal contours are within normal limits. Lungs are hyperinflated suggesting COPD. There is no focal consolidation. No pneumothorax or pleural effusion. Aorta is calcified. The visualized skeletal structures are unremarkable. IMPRESSION: Findings suggest COPD.  Otherwise no active cardiopulmonary disease. Electronically Signed   By: Sammie Bench M.D.   On: 08/30/2022 15:55    Medications: I have reviewed the patient's current medications.  Assessment: Upper GI bleed, ulcer noted on EGD with food residue, incomplete visualization  Hemoglobin 9.0 yesterday then 8.0 and 7.6 today BUN was 57 on admission, increased to 68 and has trended down to 40 Pathology from EGD with biopsy pending  Plan: Encouraged to take clear liquids today, most likely transition to full liquids in a.m.Marland Kitchen Continue pantoprazole 40 mg every 12 hours for now. Patient was on aspirin at home, recommend avoiding aspirin and NSAIDs at least for the next 2 weeks. If she can tolerate full liquids in a.m., we can possibly transition her to regular diet  tomorrow in evening.  Ronnette Juniper, MD 09/01/2022, 12:21 PM

## 2022-09-01 NOTE — Plan of Care (Signed)
  Problem: Education: Goal: Knowledge of General Education information will improve Description Including pain rating scale, medication(s)/side effects and non-pharmacologic comfort measures Outcome: Progressing   Problem: Health Behavior/Discharge Planning: Goal: Ability to manage health-related needs will improve Outcome: Progressing   

## 2022-09-01 NOTE — Progress Notes (Addendum)
PROGRESS NOTE  Kelly Phillips ZOX:[id number] DOB: Jan 04, 1946 DOA: 08/30/2022 PCP: Emmaline Kluver, MD   LOS: 2 days   Brief Narrative / Interim history: 77 year old female with prior GI bleed due to PUD, PAD, mesenteric artery stenosis, CAD, COPD, chronic hypoxic respiratory failure, legal blindness comes into the hospital with hematemesis.  This was associated with epigastric pain.  Caregiver witnessed some blood in the vomit, patient is blind but could feel the taste of blood.  GI consulted and she was admitted to the hospital  Subjective / 24h Interval events: She states that she is feeling more short of breath this morning.  No further nausea  Assesement and Plan: Principal Problem:   Acute upper GI bleeding Active Problems:   Chronic respiratory failure with hypoxia (HCC)   GERD without esophagitis   Peripheral vascular disease, unspecified (HCC)   Status post aortobifemoral bypass surgery   Benign essential hypertension   Bilateral carotid artery stenosis   Superior mesenteric artery stenosis (HCC)   COPD (chronic obstructive pulmonary disease) with emphysema (HCC)   Principal problem Upper GI bleed - patient with history of PUD, most recent endoscopy in 2021.  She is drinking EtOH daily, although small amount, but using NSAIDs every day.  GI consulted, underwent an EGD on 1/7 which showed large amount of probably some food and old blood in the stomach, nonbleeding gastric ulcer with no stigmata of bleeding, normal duodenum.  GI recommends clear liquid diet up until Tuesday, slowly advance diet if she is tolerating.  Appreciate follow-up.  Active problems Acute blood loss anemia - continue to monitor hemoglobin, already transfuse unit of packed red blood cells.  Hemoglobin 7.6 this morning, closely monitor, no need for transfusions just yet.  Clinically her bleeding has stopped  PAD, CAD, mesenteric artery stenosis - status post aortofemoral bypass.  Hold home aspirin for  now, resume when okay with GI  Macular degeneration, legal blindness - noted  COPD with chronic hypoxic respiratory failure - respiratory status is at baseline but subjectively she is feeling more short of breath.  Chest x-ray this morning without acute cardiopulmonary disease, continue nebulizers.  No wheezing  Scheduled Meds:  ALPRAZolam  0.25 mg Oral q morning   ALPRAZolam  0.5 mg Oral QHS   budesonide (PULMICORT) nebulizer solution  0.25 mg Nebulization BID   gabapentin  300 mg Oral Q1500   gabapentin  600 mg Oral QHS   pantoprazole (PROTONIX) IV  40 mg Intravenous Q12H   sodium chloride flush  3 mL Intravenous Q12H   Continuous Infusions:  sodium chloride 50 mL/hr at 08/31/22 1856   PRN Meds:.acetaminophen **OR** acetaminophen, ipratropium-albuterol, ondansetron (ZOFRAN) IV, polyethylene glycol  Current Outpatient Medications  Medication Instructions   ALPRAZolam (XANAX) 0.25-0.5 mg, Oral, See admin instructions, Take 1/2 tablet (0.25 mg) by mouth every morning and 1 tablet (0.5 mg) at night   amLODipine-benazepril (LOTREL) 5-10 MG capsule 1 capsule, Oral, Daily at bedtime   aspirin EC 81 mg, Oral, Daily at bedtime, Swallow whole.   baclofen (LIORESAL) 10 mg, Oral, 2 times daily PRN   docusate sodium (COLACE) 100-200 mg, Oral, See admin instructions, Take one capsule (100 mg) by mouth every other night at bedtime, take two capsules (200 mg) on alternate nights   fluticasone (FLOVENT HFA) 110 MCG/ACT inhaler 2 puffs, Inhalation, Daily   furosemide (LASIX) 20 mg, Oral, Daily PRN   gabapentin (NEURONTIN) 300-600 mg, Oral, See admin instructions, Take one capsule (300 mg) by mouth after lunch and two  capsules (600 mg) at night   ipratropium-albuterol (DUONEB) 0.5-2.5 (3) MG/3ML SOLN 3 mLs, Nebulization, Every 6 hours PRN   meloxicam (MOBIC) 15 mg, Oral, Daily PRN   OXYGEN 3 L, Inhalation, Continuous   pantoprazole (PROTONIX) 40 mg, Oral, Daily   polyethylene glycol (MIRALAX /  GLYCOLAX) 17 g, Oral, Daily PRN    Diet Orders (From admission, onward)     Start     Ordered   08/31/22 1659  Diet clear liquid Room service appropriate? No; Fluid consistency: Thin  Diet effective now       Question Answer Comment  Room service appropriate? No   Fluid consistency: Thin      08/31/22 1659            DVT prophylaxis: SCDs Start: 08/30/22 1621   Lab Results  Component Value Date   PLT 112 (L) 09/01/2022      Code Status: DNR  Family Communication: no family at bedside   Status is: Inpatient Remains inpatient appropriate because: GI bleed   Level of care: Progressive  Consultants:  GI  Objective: Vitals:   09/01/22 0000 09/01/22 0400 09/01/22 0737 09/01/22 0800  BP: (!) 99/59 (!) 95/51  111/65  Pulse: 91 98  65  Resp: 16 16  (!) 25  Temp: 97.7 F (36.5 C) 97.9 F (36.6 C)  98.1 F (36.7 C)  TempSrc: Oral Oral  Oral  SpO2: 100% 100% 97% 99%  Weight:      Height:        Intake/Output Summary (Last 24 hours) at 09/01/2022 1048 Last data filed at 09/01/2022 0400 Gross per 24 hour  Intake 627.77 ml  Output 1150 ml  Net -522.23 ml    Wt Readings from Last 3 Encounters:  08/30/22 54.4 kg  02/24/20 56.7 kg  10/28/19 58 kg    Examination: Constitutional: NAD Eyes: lids and conjunctivae normal, no scleral icterus ENMT: mmm Neck: normal, supple Respiratory: clear to auscultation bilaterally, no wheezing, no crackles. Normal respiratory effort.  Cardiovascular: Regular rate and rhythm, no murmurs / rubs / gallops.  Abdomen: soft, no distention, no tenderness. Bowel sounds positive.  Skin: no rashes Neurologic: no focal deficits, equal strength  Data Reviewed: I have independently reviewed following labs and imaging studies  CBC Recent Labs  Lab 08/30/22 1350 08/30/22 1700 08/31/22 0102 08/31/22 1431 09/01/22 0620  WBC 11.1* 10.5 12.6* 12.3* 12.5*  HGB 9.5* 7.5* 9.0* 8.0* 7.6*  HCT 31.8* 23.5* 28.0* 26.1* 24.9*  PLT 149* 136*  115* 113* 112*  MCV 99.7 97.1 92.4 95.6 97.3  MCH 29.8 31.0 29.7 29.3 29.7  MCHC 29.9* 31.9 32.1 30.7 30.5  RDW 13.4 13.4 15.9* 16.0* 15.6*  LYMPHSABS 0.4* 0.5*  --   --   --   MONOABS 0.4 0.8  --   --   --   EOSABS 0.0 0.0  --   --   --   BASOSABS 0.0 0.0  --   --   --      Recent Labs  Lab 08/30/22 1300 08/30/22 1400 08/31/22 0102 09/01/22 0324  NA  --  139 143 139  K  --  5.0 4.8 4.8  CL  --  95* 105 106  CO2  --  32 31 24  GLUCOSE  --  113* 124* 94  BUN  --  57* 68* 40*  CREATININE  --  0.82 0.75 0.69  CALCIUM  --  9.4 8.8* 8.5*  AST  --  24  19  --   ALT  --  19 15  --   ALKPHOS  --  87 64  --   BILITOT  --  0.7 0.7  --   ALBUMIN  --  3.6 2.9*  --   INR 1.0  --   --   --      ------------------------------------------------------------------------------------------------------------------ No results for input(s): "CHOL", "HDL", "LDLCALC", "TRIG", "CHOLHDL", "LDLDIRECT" in the last 72 hours.  Lab Results  Component Value Date   HGBA1C 6.1 (H) 05/17/2015   ------------------------------------------------------------------------------------------------------------------ No results for input(s): "TSH", "T4TOTAL", "T3FREE", "THYROIDAB" in the last 72 hours.  Invalid input(s): "FREET3"  Cardiac Enzymes No results for input(s): "CKMB", "TROPONINI", "MYOGLOBIN" in the last 168 hours.  Invalid input(s): "CK" ------------------------------------------------------------------------------------------------------------------    Component Value Date/Time   BNP 440.9 (H) 05/14/2015 0553    CBG: No results for input(s): "GLUCAP" in the last 168 hours.  No results found for this or any previous visit (from the past 240 hour(s)).   Radiology Studies: DG CHEST PORT 1 VIEW  Result Date: 09/01/2022 CLINICAL DATA:  Dyspnea EXAM: PORTABLE CHEST 1 VIEW COMPARISON:  08/30/2022 FINDINGS: Lungs are hyperinflated as can be seen with COPD. Right upper lobe pulmonary nodule seen  on the CT chest dated 03/28/2022 is not well visualized. No focal consolidation. No pleural effusion or pneumothorax. Heart and mediastinal contours are unremarkable. No acute osseous abnormality. IMPRESSION: 1. No acute cardiopulmonary disease. 2. Right upper lobe pulmonary nodule seen on the CT chest dated 03/28/2022 is not well visualized. Electronically Signed   By: Kathreen Devoid M.D.   On: 09/01/2022 10:00     Marzetta Board, MD, PhD [facility]  Between 7 am - 7 pm I am available, please contact me via Amion (for emergencies) or Securechat (non urgent messages)  Between 7 pm - 7 am I am not available, please contact night coverage MD/APP via Amion

## 2022-09-01 NOTE — Plan of Care (Signed)

## 2022-09-01 NOTE — Plan of Care (Signed)
  Problem: Education: Goal: Knowledge of General Education information will improve Description: Including pain rating scale, medication(s)/side effects and non-pharmacologic comfort measures 09/01/2022 2325 by Gena Fray, RN Outcome: Progressing 09/01/2022 2322 by Gena Fray, RN Outcome: Progressing   Problem: Health Behavior/Discharge Planning: Goal: Ability to manage health-related needs will improve 09/01/2022 2325 by Gena Fray, RN Outcome: Progressing 09/01/2022 2322 by Gena Fray, RN Outcome: Progressing   Problem: Clinical Measurements: Goal: Ability to maintain clinical measurements within normal limits will improve 09/01/2022 2325 by Gena Fray, RN Outcome: Progressing 09/01/2022 2322 by Gena Fray, RN Outcome: Progressing Goal: Will remain free from infection 09/01/2022 2325 by Gena Fray, RN Outcome: Progressing 09/01/2022 2322 by Gena Fray, RN Outcome: Progressing Goal: Diagnostic test results will improve 09/01/2022 2325 by Gena Fray, RN Outcome: Progressing 09/01/2022 2322 by Gena Fray, RN Outcome: Progressing Goal: Respiratory complications will improve 09/01/2022 2325 by Gena Fray, RN Outcome: Progressing 09/01/2022 2322 by Gena Fray, RN Outcome: Progressing Goal: Cardiovascular complication will be avoided 09/01/2022 2325 by Gena Fray, RN Outcome: Progressing 09/01/2022 2322 by Gena Fray, RN Outcome: Progressing

## 2022-09-02 DIAGNOSIS — K922 Gastrointestinal hemorrhage, unspecified: Secondary | ICD-10-CM | POA: Diagnosis not present

## 2022-09-02 LAB — CBC
HCT: 22.2 % — ABNORMAL LOW (ref 36.0–46.0)
Hemoglobin: 6.6 g/dL — CL (ref 12.0–15.0)
MCH: 29.9 pg (ref 26.0–34.0)
MCHC: 29.7 g/dL — ABNORMAL LOW (ref 30.0–36.0)
MCV: 100.5 fL — ABNORMAL HIGH (ref 80.0–100.0)
Platelets: 123 10*3/uL — ABNORMAL LOW (ref 150–400)
RBC: 2.21 MIL/uL — ABNORMAL LOW (ref 3.87–5.11)
RDW: 14.6 % (ref 11.5–15.5)
WBC: 12.2 10*3/uL — ABNORMAL HIGH (ref 4.0–10.5)
nRBC: 0.6 % — ABNORMAL HIGH (ref 0.0–0.2)

## 2022-09-02 LAB — BASIC METABOLIC PANEL
Anion gap: 5 (ref 5–15)
BUN: 21 mg/dL (ref 8–23)
CO2: 30 mmol/L (ref 22–32)
Calcium: 8.2 mg/dL — ABNORMAL LOW (ref 8.9–10.3)
Chloride: 103 mmol/L (ref 98–111)
Creatinine, Ser: 0.82 mg/dL (ref 0.44–1.00)
GFR, Estimated: >60 mL/min (ref >60)
Glucose, Bld: 124 mg/dL — ABNORMAL HIGH (ref 70–99)
Potassium: 4.7 mmol/L (ref 3.5–5.1)
Sodium: 138 mmol/L (ref 135–145)

## 2022-09-02 LAB — SURGICAL PATHOLOGY

## 2022-09-02 LAB — PREPARE RBC (CROSSMATCH)

## 2022-09-02 LAB — MAGNESIUM: Magnesium: 1.8 mg/dL (ref 1.7–2.4)

## 2022-09-02 MED ORDER — SODIUM CHLORIDE 0.9% IV SOLUTION: Freq: Once | INTRAVENOUS | Status: AC

## 2022-09-02 MED ORDER — ALPRAZOLAM 0.25 MG PO TABS
0.2500 mg | ORAL_TABLET | Freq: Two times a day (BID) | ORAL | Status: DC | PRN
  Administered 2022-09-03 – 2022-09-04 (×2): 0.25 mg via ORAL
  Filled 2022-09-02 (×2): qty 1

## 2022-09-02 MED ORDER — METOCLOPRAMIDE HCL 5 MG/ML IJ SOLN
10.0000 mg | Freq: Four times a day (QID) | INTRAMUSCULAR | Status: AC
  Administered 2022-09-02 – 2022-09-03 (×4): 10 mg via INTRAVENOUS
  Filled 2022-09-02 (×4): qty 2

## 2022-09-02 NOTE — Progress Notes (Signed)
PROGRESS NOTE  Kelly Phillips PFX:[id number] DOB: [birth year] DOA: 08/30/2022 PCP: Emmaline Kluver, MD   LOS: 3 days   Brief Narrative / Interim history: 77 year old female with prior GI bleed due to PUD, PAD, mesenteric artery stenosis, CAD, COPD, chronic hypoxic respiratory failure, legal blindness comes into the hospital with hematemesis.  This was associated with epigastric pain.  Caregiver witnessed some blood in the vomit, patient is blind but could feel the taste of blood.  GI consulted and she was admitted to the hospital  Subjective / 24h Interval events: She does not feel good this morning, feels weak, ongoing shortness of breath.  Denies any further nausea or vomiting.  Assesement and Plan: Principal Problem:   Acute upper GI bleeding Active Problems:   Chronic respiratory failure with hypoxia (HCC)   GERD without esophagitis   Peripheral vascular disease, unspecified (HCC)   Status post aortobifemoral bypass surgery   Benign essential hypertension   Bilateral carotid artery stenosis   Superior mesenteric artery stenosis (HCC)   COPD (chronic obstructive pulmonary disease) with emphysema (HCC)   Principal problem Upper GI bleed - patient with history of PUD, most recent endoscopy in 2021.  She is drinking EtOH daily, although small amount, but using NSAIDs every day.  GI consulted, underwent an EGD on 1/7 which showed large amount of probably some food and old blood in the stomach, nonbleeding gastric ulcer with no stigmata of bleeding, normal duodenum.  GI recommends clear liquid diet up until Tuesday, slowly advance diet if she is tolerating.  Appreciate GI follow-up.  Keep on clears this morning due to hemoglobin drop, defer further management to GI  Active problems Acute blood loss anemia - continue to monitor hemoglobin, she was transfused unit of packed red blood cells on admission.  Hemoglobin drifting down again to 6.6 this morning, transfuse a second unit of  blood.  Closely monitor.  Appreciate GI follow-up  PAD, CAD, mesenteric artery stenosis - status post aortofemoral bypass.  Hold home aspirin for now, resume when okay with GI  Macular degeneration, legal blindness - noted  COPD with chronic hypoxic respiratory failure, 3.5 L at home- respiratory status is at baseline but subjectively she is feeling more short of breath, possibly in the setting of anemia.  Chest x-ray yesterday unremarkable.  Continue supplemental oxygen  Scheduled Meds:  ALPRAZolam  0.25 mg Oral q morning   ALPRAZolam  0.5 mg Oral QHS   budesonide (PULMICORT) nebulizer solution  0.25 mg Nebulization BID   gabapentin  300 mg Oral Q1500   gabapentin  600 mg Oral QHS   pantoprazole (PROTONIX) IV  40 mg Intravenous Q12H   sodium chloride flush  3 mL Intravenous Q12H   Continuous Infusions:  sodium chloride 50 mL/hr at 08/31/22 1856   PRN Meds:.acetaminophen **OR** acetaminophen, ipratropium-albuterol, ondansetron (ZOFRAN) IV, polyethylene glycol  Current Outpatient Medications  Medication Instructions   ALPRAZolam (XANAX) 0.25-0.5 mg, Oral, See admin instructions, Take 1/2 tablet (0.25 mg) by mouth every morning and 1 tablet (0.5 mg) at night   amLODipine-benazepril (LOTREL) 5-10 MG capsule 1 capsule, Oral, Daily at bedtime   aspirin EC 81 mg, Oral, Daily at bedtime, Swallow whole.   baclofen (LIORESAL) 10 mg, Oral, 2 times daily PRN   docusate sodium (COLACE) 100-200 mg, Oral, See admin instructions, Take one capsule (100 mg) by mouth every other night at bedtime, take two capsules (200 mg) on alternate nights   fluticasone (FLOVENT HFA) 110 MCG/ACT inhaler 2 puffs, Inhalation, Daily  furosemide (LASIX) 20 mg, Oral, Daily PRN   gabapentin (NEURONTIN) 300-600 mg, Oral, See admin instructions, Take one capsule (300 mg) by mouth after lunch and two capsules (600 mg) at night   ipratropium-albuterol (DUONEB) 0.5-2.5 (3) MG/3ML SOLN 3 mLs, Nebulization, Every 6 hours PRN    meloxicam (MOBIC) 15 mg, Oral, Daily PRN   OXYGEN 3 L, Inhalation, Continuous   pantoprazole (PROTONIX) 40 mg, Oral, Daily   polyethylene glycol (MIRALAX / GLYCOLAX) 17 g, Oral, Daily PRN    Diet Orders (From admission, onward)     Start     Ordered   08/31/22 1659  Diet clear liquid Room service appropriate? No; Fluid consistency: Thin  Diet effective now       Question Answer Comment  Room service appropriate? No   Fluid consistency: Thin      08/31/22 1659            DVT prophylaxis: SCDs Start: 08/30/22 1621   Lab Results  Component Value Date   PLT 123 (L) 09/02/2022      Code Status: DNR  Family Communication: no family at bedside   Status is: Inpatient Remains inpatient appropriate because: GI bleed   Level of care: Progressive  Consultants:  GI  Objective: Vitals:   09/02/22 0754 09/02/22 0755 09/02/22 0756 09/02/22 0757  BP:      Pulse: (!) 107 (!) 105 (!) 106 (!) 105  Resp: (!) 28 (!) 28 (!) 28 (!) 28  Temp:      TempSrc:      SpO2: 93% 93% 93% 93%  Weight:      Height:        Intake/Output Summary (Last 24 hours) at 09/02/2022 1122 Last data filed at 09/02/2022 0504 Gross per 24 hour  Intake --  Output 800 ml  Net -800 ml    Wt Readings from Last 3 Encounters:  08/30/22 54.4 kg  02/24/20 56.7 kg  10/28/19 58 kg    Examination: Constitutional: NAD, pale appearing Eyes: lids and conjunctivae normal, no scleral icterus ENMT: mmm Neck: normal, supple Respiratory: clear to auscultation bilaterally, no wheezing, no crackles. Normal respiratory effort.  Cardiovascular: Regular rate and rhythm, no murmurs / rubs / gallops. No LE edema. Abdomen: soft, no distention, no tenderness. Bowel sounds positive.   Data Reviewed: I have independently reviewed following labs and imaging studies  CBC Recent Labs  Lab 08/30/22 1350 08/30/22 1700 08/31/22 0102 08/31/22 1431 09/01/22 0620 09/02/22 0502  WBC 11.1* 10.5 12.6* 12.3* 12.5* 12.2*   HGB 9.5* 7.5* 9.0* 8.0* 7.6* 6.6*  HCT 31.8* 23.5* 28.0* 26.1* 24.9* 22.2*  PLT 149* 136* 115* 113* 112* 123*  MCV 99.7 97.1 92.4 95.6 97.3 100.5*  MCH 29.8 31.0 29.7 29.3 29.7 29.9  MCHC 29.9* 31.9 32.1 30.7 30.5 29.7*  RDW 13.4 13.4 15.9* 16.0* 15.6* 14.6  LYMPHSABS 0.4* 0.5*  --   --   --   --   MONOABS 0.4 0.8  --   --   --   --   EOSABS 0.0 0.0  --   --   --   --   BASOSABS 0.0 0.0  --   --   --   --      Recent Labs  Lab 08/30/22 1300 08/30/22 1400 08/31/22 0102 09/01/22 0324 09/02/22 0502  NA  --  139 143 139 138  K  --  5.0 4.8 4.8 4.7  CL  --  95* 105 106 103  CO2  --  32 '31 24 30  '$ GLUCOSE  --  113* 124* 94 124*  BUN  --  57* 68* 40* 21  CREATININE  --  0.82 0.75 0.69 0.82  CALCIUM  --  9.4 8.8* 8.5* 8.2*  AST  --  24 19  --   --   ALT  --  19 15  --   --   ALKPHOS  --  87 64  --   --   BILITOT  --  0.7 0.7  --   --   ALBUMIN  --  3.6 2.9*  --   --   MG  --   --   --   --  1.8  INR 1.0  --   --   --   --      ------------------------------------------------------------------------------------------------------------------ No results for input(s): "CHOL", "HDL", "LDLCALC", "TRIG", "CHOLHDL", "LDLDIRECT" in the last 72 hours.  Lab Results  Component Value Date   HGBA1C 6.1 (H) 05/17/2015   ------------------------------------------------------------------------------------------------------------------ No results for input(s): "TSH", "T4TOTAL", "T3FREE", "THYROIDAB" in the last 72 hours.  Invalid input(s): "FREET3"  Cardiac Enzymes No results for input(s): "CKMB", "TROPONINI", "MYOGLOBIN" in the last 168 hours.  Invalid input(s): "CK" ------------------------------------------------------------------------------------------------------------------    Component Value Date/Time   BNP 440.9 (H) 05/14/2015 0553    CBG: No results for input(s): "GLUCAP" in the last 168 hours.  No results found for this or any previous visit (from the past 240  hour(s)).   Radiology Studies: No results found.   Marzetta Board, MD, PhD [facility]  Between 7 am - 7 pm I am available, please contact me via Amion (for emergencies) or Securechat (non urgent messages)  Between 7 pm - 7 am I am not available, please contact night coverage MD/APP via Amion

## 2022-09-02 NOTE — Progress Notes (Addendum)
Subjective: Patient appears more lethargic today, however as per her nurse she has not been disoriented or confused and spoke appropriately with the son over the phone in the morning. Patient states she has not been able to tolerate anything more than water and does not recall having a bowel movement. She states she does not feel good, feels weaker.  Objective: Vital signs in last 24 hours: Temp:  [97.3 F (36.3 C)-98.1 F (36.7 C)] 97.3 F (36.3 C) (01/09 1145) Pulse Rate:  [49-118] 97 (01/09 1145) Resp:  [19-30] 20 (01/09 1145) BP: (96-121)/(47-92) 107/64 (01/09 1145) SpO2:  [87 %-100 %] 98 % (01/09 1145) Weight change:  Last BM Date : 08/31/21  PE: Elderly, prominent pallor GENERAL: Lying on bed, more lethargic than yesterday  ABDOMEN: Soft, nondistended, nontender abdomen, sluggish bowel sounds EXTREMITIES: No deformity, no edema  Lab Results: Results for orders placed or performed during the hospital encounter of 08/30/22 (from the past 48 hour(s))  CBC     Status: Abnormal   Collection Time: 08/31/22  2:31 PM  Result Value Ref Range   WBC 12.3 (H) 4.0 - 10.5 K/uL   RBC 2.73 (L) 3.87 - 5.11 MIL/uL   Hemoglobin 8.0 (L) 12.0 - 15.0 g/dL   HCT 26.1 (L) 36.0 - 46.0 %   MCV 95.6 80.0 - 100.0 fL   MCH 29.3 26.0 - 34.0 pg   MCHC 30.7 30.0 - 36.0 g/dL   RDW 16.0 (H) 11.5 - 15.5 %   Platelets 113 (L) 150 - 400 K/uL   nRBC 0.0 0.0 - 0.2 %    Comment: Performed at [facility], [address], [place], [place] [address]  Basic metabolic panel     Status: Abnormal   Collection Time: 09/01/22  3:24 AM  Result Value Ref Range   Sodium 139 135 - 145 mmol/L   Potassium 4.8 3.5 - 5.1 mmol/L   Chloride 106 98 - 111 mmol/L   CO2 24 22 - 32 mmol/L   Glucose, Bld 94 70 - 99 mg/dL    Comment: Glucose reference range applies only to samples taken after fasting for at least 8 hours.   BUN 40 (H) 8 - 23 mg/dL   Creatinine, Ser 0.69 0.44 - 1.00 mg/dL   Calcium 8.5 (L) 8.9 - 10.3  mg/dL   GFR, Estimated >60 >60 mL/min    Comment: (NOTE) Calculated using the CKD-EPI Creatinine Equation (2021)    Anion gap 9 5 - 15    Comment: Performed at [facility] [address] [place], [place] [address]  CBC     Status: Abnormal   Collection Time: 09/01/22  6:20 AM  Result Value Ref Range   WBC 12.5 (H) 4.0 - 10.5 K/uL   RBC 2.56 (L) 3.87 - 5.11 MIL/uL   Hemoglobin 7.6 (L) 12.0 - 15.0 g/dL   HCT 24.9 (L) 36.0 - 46.0 %   MCV 97.3 80.0 - 100.0 fL   MCH 29.7 26.0 - 34.0 pg   MCHC 30.5 30.0 - 36.0 g/dL   RDW 15.6 (H) 11.5 - 15.5 %   Platelets 112 (L) 150 - 400 K/uL   nRBC 0.2 0.0 - 0.2 %    Comment: Performed at [facility] [address] [place], [place] [address]  Basic metabolic panel     Status: Abnormal   Collection Time: 09/02/22  5:02 AM  Result Value Ref Range   Sodium 138 135 - 145 mmol/L  Potassium 4.7 3.5 - 5.1 mmol/L   Chloride 103 98 - 111 mmol/L   CO2 30 22 - 32 mmol/L   Glucose, Bld 124 (H) 70 - 99 mg/dL    Comment: Glucose reference range applies only to samples taken after fasting for at least 8 hours.   BUN 21 8 - 23 mg/dL   Creatinine, Ser 0.82 0.44 - 1.00 mg/dL   Calcium 8.2 (L) 8.9 - 10.3 mg/dL   GFR, Estimated >60 >60 mL/min    Comment: (NOTE) Calculated using the CKD-EPI Creatinine Equation (2021)    Anion gap 5 5 - 15    Comment: Performed at [facility] [address], [place] [address]  CBC     Status: Abnormal   Collection Time: 09/02/22  5:02 AM  Result Value Ref Range   WBC 12.2 (H) 4.0 - 10.5 K/uL   RBC 2.21 (L) 3.87 - 5.11 MIL/uL   Hemoglobin 6.6 (LL) 12.0 - 15.0 g/dL    Comment: REPEATED TO VERIFY THIS CRITICAL RESULT HAS VERIFIED AND BEEN CALLED TO Lenna Sciara RIFE, RN BY ENIOLA ADEDOKUN ON 01 09 2024 AT 0548, AND HAS BEEN READ BACK.     HCT 22.2 (L) 36.0 - 46.0 %   MCV 100.5 (H) 80.0 - 100.0 fL   MCH 29.9 26.0 - 34.0 pg   MCHC 29.7 (L) 30.0 - 36.0 g/dL   RDW 14.6 11.5 - 15.5 %   Platelets 123 (L)  150 - 400 K/uL   nRBC 0.6 (H) 0.0 - 0.2 %    Comment: Performed at [facility] [address] [place], [place] [address]  Magnesium     Status: None   Collection Time: 09/02/22  5:02 AM  Result Value Ref Range   Magnesium 1.8 1.7 - 2.4 mg/dL    Comment: Performed at [facility] [address], [place], [place] [address]  Prepare RBC (crossmatch)     Status: None   Collection Time: 09/02/22  6:26 AM  Result Value Ref Range   Order Confirmation      ORDER PROCESSED BY BLOOD BANK Performed at [facility], [place] [address], [place], [place] [address]     Studies/Results: DG CHEST PORT 1 VIEW  Result Date: 09/01/2022 CLINICAL DATA:  Dyspnea EXAM: PORTABLE CHEST 1 VIEW COMPARISON:  08/30/2022 FINDINGS: Lungs are hyperinflated as can be seen with COPD. Right upper lobe pulmonary nodule seen on the CT chest dated 03/28/2022 is not well visualized. No focal consolidation. No pleural effusion or pneumothorax. Heart and mediastinal contours are unremarkable. No acute osseous abnormality. IMPRESSION: 1. No acute cardiopulmonary disease. 2. Right upper lobe pulmonary nodule seen on the CT chest dated 03/28/2022 is not well visualized. Electronically Signed   By: Kathreen Devoid M.D.   On: 09/01/2022 10:00    Medications: I have reviewed the patient's current medications.  Assessment: Gastric ulcers with large food residue noted on recent endoscopy Hemoglobin has further dropped from 7.6-6.6 today  Multiple comorbidities including PAD, CAD, mesenteric artery thrombosis, status post aortofemoral bypass, legal blindness, macular degeneration, COPD with chronic hypoxic respiratory failure  Plan: 1 unit PRBC ordered for today, last transfusion was on 08/30/22 I will order metoclopramide 10 mg IV every 6 hours for 4 doses in an attempt to flush the gastric contents for better visualization and plan repeat endoscopy in a.m tomorrow. Continue pantoprazole 40 mg every 12 hours, monitor  hemodynamics and transfuse more PRBC if needed.  Ronnette Juniper, MD 09/02/2022, 1:07 PM

## 2022-09-02 NOTE — Care Management Important Message (Signed)
Important Message  Patient Details  Name: Kelly Phillips MRN: [id number] Date of Birth: 1945/10/22   Medicare Important Message Given:  Yes     Orbie Pyo 09/02/2022, 2:57 PM

## 2022-09-02 NOTE — Plan of Care (Signed)

## 2022-09-02 NOTE — H&P (View-Only) (Signed)
Subjective: Patient appears more lethargic today, however as per her nurse she has not been disoriented or confused and spoke appropriately with the son over the phone in the morning. Patient states she has not been able to tolerate anything more than water and does not recall having a bowel movement. She states she does not feel good, feels weaker.  Objective: Vital signs in last 24 hours: Temp:  [97.3 F (36.3 C)-98.1 F (36.7 C)] 97.3 F (36.3 C) (01/09 1145) Pulse Rate:  [49-118] 97 (01/09 1145) Resp:  [19-30] 20 (01/09 1145) BP: (96-121)/(47-92) 107/64 (01/09 1145) SpO2:  [87 %-100 %] 98 % (01/09 1145) Weight change:  Last BM Date : 08/31/21  PE: Elderly, prominent pallor GENERAL: Lying on bed, more lethargic than yesterday  ABDOMEN: Soft, nondistended, nontender abdomen, sluggish bowel sounds EXTREMITIES: No deformity, no edema  Lab Results: Results for orders placed or performed during the hospital encounter of 08/30/22 (from the past 48 hour(s))  CBC     Status: Abnormal   Collection Time: 08/31/22  2:31 PM  Result Value Ref Range   WBC 12.3 (H) 4.0 - 10.5 K/uL   RBC 2.73 (L) 3.87 - 5.11 MIL/uL   Hemoglobin 8.0 (L) 12.0 - 15.0 g/dL   HCT 26.1 (L) 36.0 - 46.0 %   MCV 95.6 80.0 - 100.0 fL   MCH 29.3 26.0 - 34.0 pg   MCHC 30.7 30.0 - 36.0 g/dL   RDW 16.0 (H) 11.5 - 15.5 %   Platelets 113 (L) 150 - 400 K/uL   nRBC 0.0 0.0 - 0.2 %    Comment: Performed at [facility], [address] [place], [place] [address]  Basic metabolic panel     Status: Abnormal   Collection Time: 09/01/22  3:24 AM  Result Value Ref Range   Sodium 139 135 - 145 mmol/L   Potassium 4.8 3.5 - 5.1 mmol/L   Chloride 106 98 - 111 mmol/L   CO2 24 22 - 32 mmol/L   Glucose, Bld 94 70 - 99 mg/dL    Comment: Glucose reference range applies only to samples taken after fasting for at least 8 hours.   BUN 40 (H) 8 - 23 mg/dL   Creatinine, Ser 0.69 0.44 - 1.00 mg/dL   Calcium 8.5 (L) 8.9 - 10.3  mg/dL   GFR, Estimated >60 >60 mL/min    Comment: (NOTE) Calculated using the CKD-EPI Creatinine Equation (2021)    Anion gap 9 5 - 15    Comment: Performed at [facility] [address], [place], [place] [address]  CBC     Status: Abnormal   Collection Time: 09/01/22  6:20 AM  Result Value Ref Range   WBC 12.5 (H) 4.0 - 10.5 K/uL   RBC 2.56 (L) 3.87 - 5.11 MIL/uL   Hemoglobin 7.6 (L) 12.0 - 15.0 g/dL   HCT 24.9 (L) 36.0 - 46.0 %   MCV 97.3 80.0 - 100.0 fL   MCH 29.7 26.0 - 34.0 pg   MCHC 30.5 30.0 - 36.0 g/dL   RDW 15.6 (H) 11.5 - 15.5 %   Platelets 112 (L) 150 - 400 K/uL   nRBC 0.2 0.0 - 0.2 %    Comment: Performed at [facility] [address], [place], [place] [address]  Basic metabolic panel     Status: Abnormal   Collection Time: 09/02/22  5:02 AM  Result Value Ref Range   Sodium 138 135 - 145 mmol/L  Potassium 4.7 3.5 - 5.1 mmol/L   Chloride 103 98 - 111 mmol/L   CO2 30 22 - 32 mmol/L   Glucose, Bld 124 (H) 70 - 99 mg/dL    Comment: Glucose reference range applies only to samples taken after fasting for at least 8 hours.   BUN 21 8 - 23 mg/dL   Creatinine, Ser 0.82 0.44 - 1.00 mg/dL   Calcium 8.2 (L) 8.9 - 10.3 mg/dL   GFR, Estimated >60 >60 mL/min    Comment: (NOTE) Calculated using the CKD-EPI Creatinine Equation (2021)    Anion gap 5 5 - 15    Comment: Performed at [facility] [address] [place] [address]  CBC     Status: Abnormal   Collection Time: 09/02/22  5:02 AM  Result Value Ref Range   WBC 12.2 (H) 4.0 - 10.5 K/uL   RBC 2.21 (L) 3.87 - 5.11 MIL/uL   Hemoglobin 6.6 (LL) 12.0 - 15.0 g/dL    Comment: REPEATED TO VERIFY THIS CRITICAL RESULT HAS VERIFIED AND BEEN CALLED TO Lenna Sciara RIFE, RN BY ENIOLA ADEDOKUN ON 01 09 2024 AT 0548, AND HAS BEEN READ BACK.     HCT 22.2 (L) 36.0 - 46.0 %   MCV 100.5 (H) 80.0 - 100.0 fL   MCH 29.9 26.0 - 34.0 pg   MCHC 29.7 (L) 30.0 - 36.0 g/dL   RDW 14.6 11.5 - 15.5 %   Platelets 123 (L)  150 - 400 K/uL   nRBC 0.6 (H) 0.0 - 0.2 %    Comment: Performed at [facility] [address] [place], [place] [address]  Magnesium     Status: None   Collection Time: 09/02/22  5:02 AM  Result Value Ref Range   Magnesium 1.8 1.7 - 2.4 mg/dL    Comment: Performed at [facility] [address] [place], [place] [address]  Prepare RBC (crossmatch)     Status: None   Collection Time: 09/02/22  6:26 AM  Result Value Ref Range   Order Confirmation      ORDER PROCESSED BY BLOOD BANK Performed at [facility], [place] [address] [place], [place] [address]     Studies/Results: DG CHEST PORT 1 VIEW  Result Date: 09/01/2022 CLINICAL DATA:  Dyspnea EXAM: PORTABLE CHEST 1 VIEW COMPARISON:  08/30/2022 FINDINGS: Lungs are hyperinflated as can be seen with COPD. Right upper lobe pulmonary nodule seen on the CT chest dated 03/28/2022 is not well visualized. No focal consolidation. No pleural effusion or pneumothorax. Heart and mediastinal contours are unremarkable. No acute osseous abnormality. IMPRESSION: 1. No acute cardiopulmonary disease. 2. Right upper lobe pulmonary nodule seen on the CT chest dated 03/28/2022 is not well visualized. Electronically Signed   By: Kathreen Devoid M.D.   On: 09/01/2022 10:00    Medications: I have reviewed the patient's current medications.  Assessment: Gastric ulcers with large food residue noted on recent endoscopy Hemoglobin has further dropped from 7.6-6.6 today  Multiple comorbidities including PAD, CAD, mesenteric artery thrombosis, status post aortofemoral bypass, legal blindness, macular degeneration, COPD with chronic hypoxic respiratory failure  Plan: 1 unit PRBC ordered for today, last transfusion was on 08/30/22 I will order metoclopramide 10 mg IV every 6 hours for 4 doses in an attempt to flush the gastric contents for better visualization and plan repeat endoscopy in a.m tomorrow. Continue pantoprazole 40 mg every 12 hours, monitor  hemodynamics and transfuse more PRBC if needed.  Ronnette Juniper, MD 09/02/2022, 1:07 PM

## 2022-09-03 ENCOUNTER — Encounter (HOSPITAL_COMMUNITY)
Admission: EM | Disposition: A | Discharge status: Skilled Nursing Facility | Payer: Self-pay | Source: Home / Self Care | Attending: Internal Medicine

## 2022-09-03 ENCOUNTER — Encounter (HOSPITAL_COMMUNITY): Payer: Self-pay | Admitting: Internal Medicine

## 2022-09-03 ENCOUNTER — Inpatient Hospital Stay (HOSPITAL_COMMUNITY): Payer: Medicare HMO | Admitting: Anesthesiology

## 2022-09-03 DIAGNOSIS — D62 Acute posthemorrhagic anemia: Secondary | ICD-10-CM | POA: Diagnosis not present

## 2022-09-03 DIAGNOSIS — K222 Esophageal obstruction: Secondary | ICD-10-CM | POA: Diagnosis not present

## 2022-09-03 DIAGNOSIS — J449 Chronic obstructive pulmonary disease, unspecified: Secondary | ICD-10-CM

## 2022-09-03 DIAGNOSIS — K259 Gastric ulcer, unspecified as acute or chronic, without hemorrhage or perforation: Secondary | ICD-10-CM

## 2022-09-03 DIAGNOSIS — Z87891 Personal history of nicotine dependence: Secondary | ICD-10-CM

## 2022-09-03 DIAGNOSIS — J9611 Chronic respiratory failure with hypoxia: Secondary | ICD-10-CM | POA: Diagnosis not present

## 2022-09-03 DIAGNOSIS — K311 Adult hypertrophic pyloric stenosis: Secondary | ICD-10-CM

## 2022-09-03 DIAGNOSIS — K922 Gastrointestinal hemorrhage, unspecified: Secondary | ICD-10-CM | POA: Diagnosis not present

## 2022-09-03 HISTORY — PX: ESOPHAGOGASTRODUODENOSCOPY (EGD) WITH PROPOFOL: SHX5813

## 2022-09-03 HISTORY — PX: BALLOON DILATION: SHX5330

## 2022-09-03 LAB — BASIC METABOLIC PANEL
Anion gap: 5 (ref 5–15)
BUN: 20 mg/dL (ref 8–23)
CO2: 27 mmol/L (ref 22–32)
Calcium: 8.4 mg/dL — ABNORMAL LOW (ref 8.9–10.3)
Chloride: 106 mmol/L (ref 98–111)
Creatinine, Ser: 0.72 mg/dL (ref 0.44–1.00)
GFR, Estimated: >60 mL/min (ref >60)
Glucose, Bld: 131 mg/dL — ABNORMAL HIGH (ref 70–99)
Potassium: 5.4 mmol/L — ABNORMAL HIGH (ref 3.5–5.1)
Sodium: 138 mmol/L (ref 135–145)

## 2022-09-03 LAB — CBC
HCT: 33.5 % — ABNORMAL LOW (ref 36.0–46.0)
Hemoglobin: 10.1 g/dL — ABNORMAL LOW (ref 12.0–15.0)
MCH: 28.7 pg (ref 26.0–34.0)
MCHC: 30.1 g/dL (ref 30.0–36.0)
MCV: 95.2 fL (ref 80.0–100.0)
Platelets: 160 10*3/uL (ref 150–400)
RBC: 3.52 MIL/uL — ABNORMAL LOW (ref 3.87–5.11)
RDW: 18.2 % — ABNORMAL HIGH (ref 11.5–15.5)
WBC: 12.9 10*3/uL — ABNORMAL HIGH (ref 4.0–10.5)
nRBC: 1.2 % — ABNORMAL HIGH (ref 0.0–0.2)

## 2022-09-03 LAB — BPAM RBC
Blood Product Expiration Date: 202401162359
Blood Product Expiration Date: 202402012359
ISSUE DATE / TIME: 202401062012
ISSUE DATE / TIME: 202401090716
Unit Type and Rh: 5100
Unit Type and Rh: 5100

## 2022-09-03 LAB — TYPE AND SCREEN
ABO/RH(D): O POS
Antibody Screen: NEGATIVE
Unit division: 0
Unit division: 0

## 2022-09-03 LAB — MAGNESIUM: Magnesium: 1.9 mg/dL (ref 1.7–2.4)

## 2022-09-03 SURGERY — ESOPHAGOGASTRODUODENOSCOPY (EGD) WITH PROPOFOL
Anesthesia: Monitor Anesthesia Care

## 2022-09-03 MED ORDER — PHENYLEPHRINE 80 MCG/ML (10ML) SYRINGE FOR IV PUSH (FOR BLOOD PRESSURE SUPPORT)
PREFILLED_SYRINGE | INTRAVENOUS | Status: DC | PRN
  Administered 2022-09-03: 80 ug via INTRAVENOUS

## 2022-09-03 MED ORDER — PROPOFOL 500 MG/50ML IV EMUL
INTRAVENOUS | Status: DC | PRN
  Administered 2022-09-03: 100 ug/kg/min via INTRAVENOUS

## 2022-09-03 MED ORDER — ALBUTEROL SULFATE (2.5 MG/3ML) 0.083% IN NEBU
2.5000 mg | INHALATION_SOLUTION | Freq: Once | RESPIRATORY_TRACT | Status: AC
  Administered 2022-09-03: 2.5 mg via RESPIRATORY_TRACT

## 2022-09-03 MED ORDER — GABAPENTIN 300 MG PO CAPS
300.0000 mg | ORAL_CAPSULE | Freq: Every day | ORAL | Status: DC
  Administered 2022-09-03 – 2022-09-04 (×2): 300 mg via ORAL
  Filled 2022-09-03 (×2): qty 1

## 2022-09-03 MED ORDER — ALBUTEROL SULFATE (2.5 MG/3ML) 0.083% IN NEBU
INHALATION_SOLUTION | RESPIRATORY_TRACT | Status: AC
  Filled 2022-09-03: qty 3

## 2022-09-03 MED ORDER — ORAL CARE MOUTH RINSE: 15.0000 mL | OROMUCOSAL | Status: DC | PRN

## 2022-09-03 SURGICAL SUPPLY — 15 items

## 2022-09-03 NOTE — Transfer of Care (Addendum)
Immediate Anesthesia Transfer of Care Note  Patient: Kelly Phillips  Procedure(s) Performed: ESOPHAGOGASTRODUODENOSCOPY (EGD) WITH PROPOFOL BALLOON DILATION  Patient Location: Endoscopy Unit  Anesthesia Type:MAC  Level of Consciousness: drowsy  Airway & Oxygen Therapy: Patient Spontanous Breathing and Patient connected to nasal cannula oxygen  Post-op Assessment: Report given to RN and Post -op Vital signs reviewed and stable  Post vital signs: Reviewed and stable  Last Vitals:  Vitals Value Taken Time  BP 128/66 09/03/22 1540  Temp 36.3 C 09/03/22 1536  Pulse 116 09/03/22 1541  Resp 32 09/03/22 1542  SpO2 87 % 09/03/22 1541  Vitals shown include unvalidated device data.  Last Pain:  Vitals:   09/03/22 1536  TempSrc: Temporal  PainSc: 0-No pain      Patients Stated Pain Goal: 3 (82/51/89 8421)  Complications: No notable events documented.

## 2022-09-03 NOTE — Op Note (Signed)
[facility] Patient Name: Kelly Phillips Procedure Date : 09/03/2022 MRN: [id number] Attending MD: Ronnette Juniper , MD, [id number] Date of Birth: 1945-12-04 CSN: [id number] Age: 77 Admit Type: Inpatient Procedure:                Upper GI endoscopy Indications:              Acute post hemorrhagic anemia, Suspected upper                            gastrointestinal bleeding Providers:                Ronnette Juniper, MD, Jaci Carrel, RN, Fransico Setters                            [place], Technician Referring MD:             [facility] Medicines:                Monitored Anesthesia Care Complications:            No immediate complications. Estimated blood loss:                            Minimal. Estimated Blood Loss:     Estimated blood loss was minimal. Procedure:                Pre-Anesthesia Assessment:                           - Prior to the procedure, a History and Physical                            was performed, and patient medications and                            allergies were reviewed. The patient's tolerance of                            previous anesthesia was also reviewed. The risks                            and benefits of the procedure and the sedation                            options and risks were discussed with the patient.                            All questions were answered, and informed consent                            was obtained. Prior Anticoagulants: The patient has                            taken no anticoagulant or antiplatelet agents                            except for aspirin.  ASA Grade Assessment: IV - A                            patient with severe systemic disease that is a                            constant threat to life. After reviewing the risks                            and benefits, the patient was deemed in                            satisfactory condition to undergo the procedure.                           After  obtaining informed consent, the endoscope was                            passed under direct vision. Throughout the                            procedure, the patient's blood pressure, pulse, and                            oxygen saturations were monitored continuously. The                            [id number]-H190 ([id number]) Olympus endoscope was introduced                            through the mouth, and advanced to the second part                            of duodenum. The upper GI endoscopy was                            accomplished without difficulty. The patient                            tolerated the procedure well. Scope In: Scope Out: Findings:      The upper third of the esophagus, middle third of the esophagus and       lower third of the esophagus were normal.      A widely patent and non-obstructing Schatzki ring was found at the       gastroesophageal junction.      Six non-bleeding cratered, linear and superficial gastric ulcers with a       clean ulcer base (Forrest Class III) were found in the cardia, on the       lesser curvature of the gastric body, in the gastric antrum, in the       prepyloric region of the stomach and at the pylorus. The largest lesion       was 10 mm in largest dimension.      A benign-appearing, intrinsic moderate stenosis was found at the  pylorus. This was traversed. A TTS dilator was passed through the scope.       Dilation with a 15-16.5-18 mm balloon dilator was performed to 18 mm.       The dilation site was examined following endoscope reinsertion and       showed moderate mucosal disruption and complete resolution of luminal       narrowing.      The examined duodenum was normal.      Patient recently had an EGD with biopsies on 08/31/22( 3 days ago) and did       not have evidence of H pylori or intestinal metaplasia. Impression:               - Normal upper third of esophagus, middle third of                            esophagus and  lower third of esophagus.                           - Widely patent and non-obstructing Schatzki ring.                           - Non-bleeding gastric ulcers with a clean ulcer                            base (Forrest Class III).                           - Gastric stenosis was found at the pylorus.                            Dilated.                           - Normal examined duodenum.                           - No specimens collected. Moderate Sedation:      Patient did not receive moderate sedation for this procedure, but       instead received monitored anesthesia care. Recommendation:           - Resume regular diet.                           - Continue present medications.                           - Will need PPI BID for 2 months, avoid ASA and                            NSAIDs. Procedure Code(s):        --- Professional ---                           [phone], Esophagogastroduodenoscopy, flexible,                            transoral; with dilation of gastric/duodenal  stricture(s) (eg, balloon, bougie) Diagnosis Code(s):        --- Professional ---                           K22.2, Esophageal obstruction                           K25.9, Gastric ulcer, unspecified as acute or                            chronic, without hemorrhage or perforation                           K31.1, Adult hypertrophic pyloric stenosis                           D62, Acute posthemorrhagic anemia CPT copyright 2022 [employer]. All rights reserved. The codes documented in this report are preliminary and upon coder review may  be revised to meet current compliance requirements. Ronnette Juniper, MD 09/03/2022 3:36:06 PM This report has been signed electronically. Number of Addenda: 0

## 2022-09-03 NOTE — Plan of Care (Signed)

## 2022-09-03 NOTE — Anesthesia Preprocedure Evaluation (Addendum)
Anesthesia Evaluation  Patient identified by MRN, date of birth, ID band Patient awake    Reviewed: Allergy & Precautions, NPO status , Patient's Chart, lab work & pertinent test results  History of Anesthesia Complications Negative for: history of anesthetic complications  Airway Mallampati: III  TM Distance: >3 FB Neck ROM: Full    Dental  (+) Dental Advisory Given   Pulmonary shortness of breath, neg sleep apnea, COPD (3.5L oxygen, currently 94% on 5L),  oxygen dependent, neg recent URI, former smoker Multiple lung nodules   Pulmonary exam normal breath sounds clear to auscultation       Cardiovascular hypertension (amlodipine-benazepril), Pt. on medications (-) angina + Peripheral Vascular Disease  (-) Past MI and (-) Cardiac Stents  Rhythm:Regular Rate:Normal  SMA stenosis, s/p aorto-femoral bypass graft, bilateral carotid artery stenosis   Neuro/Psych negative neurological ROS     GI/Hepatic Neg liver ROS,GERD  ,,  Endo/Other  negative endocrine ROS    Renal/GU negative Renal ROS     Musculoskeletal  (+) Arthritis ,    Abdominal  (+) - obese  Peds  Hematology  (+) Blood dyscrasia, anemia   Anesthesia Other Findings K 5.4  blind  Reproductive/Obstetrics                             Anesthesia Physical Anesthesia Plan  ASA: 4  Anesthesia Plan: MAC   Post-op Pain Management:    Induction: Intravenous  PONV Risk Score and Plan: 2 and Propofol infusion and Treatment may vary due to age or medical condition  Airway Management Planned: Nasal Cannula and Natural Airway  Additional Equipment:   Intra-op Plan:   Post-operative Plan:   Informed Consent: I have reviewed the patients History and Physical, chart, labs and discussed the procedure including the risks, benefits and alternatives for the proposed anesthesia with the patient or authorized representative who has  indicated his/her understanding and acceptance.   Patient has DNR.  Discussed DNR with patient and Suspend DNR.   Dental advisory given  Plan Discussed with: CRNA and Anesthesiologist  Anesthesia Plan Comments: (Patient with increased O2 requirement (5L instead of 3.5) and looks SOB. Lungs CTAB. SpO2 93-94% on 5L. Given patient has a suspected GI bleed (Hgb 6.6 yesteray --> 10.2 after transfusion), will proceed with EGD after patient receives a breathing treatment.  Discussed with patient risks of MAC including, but not limited to, minor pain or discomfort, hearing people in the room, and possible need for backup general anesthesia. Risks for general anesthesia also discussed including, but not limited to, sore throat, hoarse voice, chipped/damaged teeth, injury to vocal cords, nausea and vomiting, allergic reactions, lung infection, heart attack, stroke, and death. All questions answered. )        Anesthesia Quick Evaluation

## 2022-09-03 NOTE — Interval H&P Note (Signed)
History and Physical Interval Note: 76/female with recent EGD which showed food residue and gastric ulcer with worsening anemia and drop in HB for a repeat EGD with propofol.  09/03/2022 2:46 PM  Kelly Phillips  has presented today for EGD, with the diagnosis of anemia, gastric ulcers.  The various methods of treatment have been discussed with the patient and family. After consideration of risks, benefits and other options for treatment, the patient has consented to  Procedure(s): ESOPHAGOGASTRODUODENOSCOPY (EGD) WITH PROPOFOL (N/A) as a surgical intervention.  The patient's history has been reviewed, patient examined, no change in status, stable for surgery.  I have reviewed the patient's chart and labs.  Questions were answered to the patient's satisfaction.     Ronnette Juniper

## 2022-09-03 NOTE — Anesthesia Procedure Notes (Signed)
Procedure Name: MAC Date/Time: 09/03/2022 3:15 PM  Performed by: Griffin Dakin, CRNAPre-anesthesia Checklist: Patient identified, Emergency Drugs available, Suction available, Patient being monitored and Timeout performed Patient Re-evaluated:Patient Re-evaluated prior to induction Oxygen Delivery Method: Nasal cannula Induction Type: IV induction Placement Confirmation: positive ETCO2 and breath sounds checked- equal and bilateral Dental Injury: Teeth and Oropharynx as per pre-operative assessment

## 2022-09-03 NOTE — Anesthesia Postprocedure Evaluation (Signed)
Anesthesia Post Note  Patient: Cleopatra Sardo  Procedure(s) Performed: ESOPHAGOGASTRODUODENOSCOPY (EGD) WITH PROPOFOL BALLOON DILATION     Patient location during evaluation: PACU Anesthesia Type: MAC Level of consciousness: awake Pain management: pain level controlled Vital Signs Assessment: post-procedure vital signs reviewed and stable Respiratory status: spontaneous breathing, nonlabored ventilation and respiratory function stable Cardiovascular status: stable and blood pressure returned to baseline Postop Assessment: no apparent nausea or vomiting Anesthetic complications: no   No notable events documented.  Last Vitals:  Vitals:   09/03/22 1551 09/03/22 1600  BP: 105/84 (!) 150/77  Pulse: (!) 113 (!) 113  Resp: (!) 30 (!) 31  Temp:    SpO2: 94% 94%    Last Pain:  Vitals:   09/03/22 1600  TempSrc:   PainSc: 0-No pain                 Nilda Simmer

## 2022-09-03 NOTE — Progress Notes (Signed)
PROGRESS NOTE  Kelly Phillips NFA:[id number] DOB: 1946-03-04 DOA: 08/30/2022 PCP: Emmaline Kluver, MD   LOS: 4 days   Brief Narrative / Interim history: 77 year old female with prior GI bleed due to PUD, PAD, mesenteric artery stenosis, CAD, COPD, chronic hypoxic respiratory failure, legal blindness comes into the hospital with hematemesis.  This was associated with epigastric pain.  Caregiver witnessed some blood in the vomit, patient is blind but could feel the taste of blood.  GI consulted and she was admitted to the hospital  Subjective / 24h Interval events:  No significant events overnight, no bowel movements overnight, she denies any complaints today.   Assesement and Plan: Principal Problem:   Acute upper GI bleeding Active Problems:   Chronic respiratory failure with hypoxia (HCC)   GERD without esophagitis   Peripheral vascular disease, unspecified (HCC)   Status post aortobifemoral bypass surgery   Benign essential hypertension   Bilateral carotid artery stenosis   Superior mesenteric artery stenosis (HCC)   COPD (chronic obstructive pulmonary disease) with emphysema (HCC)    Upper GI bleed - patient with history of PUD, most recent endoscopy in 2021.  She is drinking EtOH daily, although small amount, but using NSAIDs every day.  GI consulted, underwent an EGD on 1/7 which showed large amount of probably some food and old blood in the stomach, nonbleeding gastric ulcer with no stigmata of bleeding, normal duodenum.  -Hemoglobin dropped again yesterday to 6.6, requiring unit PRBC with great response at 10.1, plan to repeat endoscopy today to evaluate if there is a source for continuous GI bleed.    Acute blood loss anemia - continue to monitor hemoglobin, she was transfused unit of packed red blood cells on admission.   -Globin 6.6 on 1/9, received 1 unit PRBC with good response at 10.1 today.    PAD, CAD, mesenteric artery stenosis - status post aortofemoral  bypass.  Hold home aspirin for now, resume when okay with GI  Macular degeneration, legal blindness - noted  Somnolence  patient has been somnolent with poor oral intake over last 24 hours, restarted gabapentin at bedtime from 600-300, and will hold it for daytime.  COPD with chronic hypoxic respiratory failure, 3.5 L at home- respiratory status is at baseline but subjectively she is feeling more short of breath, possibly in the setting of anemia.  Chest x-ray yesterday unremarkable.  Continue supplemental oxygen  Scheduled Meds:  budesonide (PULMICORT) nebulizer solution  0.25 mg Nebulization BID   gabapentin  300 mg Oral QHS   pantoprazole (PROTONIX) IV  40 mg Intravenous Q12H   sodium chloride flush  3 mL Intravenous Q12H   Continuous Infusions:  sodium chloride 50 mL/hr at 09/02/22 1603   PRN Meds:.acetaminophen **OR** acetaminophen, ALPRAZolam, ipratropium-albuterol, ondansetron (ZOFRAN) IV, mouth rinse, polyethylene glycol  Current Outpatient Medications  Medication Instructions   ALPRAZolam (XANAX) 0.25-0.5 mg, Oral, See admin instructions, Take 1/2 tablet (0.25 mg) by mouth every morning and 1 tablet (0.5 mg) at night   amLODipine-benazepril (LOTREL) 5-10 MG capsule 1 capsule, Oral, Daily at bedtime   aspirin EC 81 mg, Oral, Daily at bedtime, Swallow whole.   baclofen (LIORESAL) 10 mg, Oral, 2 times daily PRN   docusate sodium (COLACE) 100-200 mg, Oral, See admin instructions, Take one capsule (100 mg) by mouth every other night at bedtime, take two capsules (200 mg) on alternate nights   fluticasone (FLOVENT HFA) 110 MCG/ACT inhaler 2 puffs, Inhalation, Daily   furosemide (LASIX) 20 mg, Oral, Daily PRN  gabapentin (NEURONTIN) 300-600 mg, Oral, See admin instructions, Take one capsule (300 mg) by mouth after lunch and two capsules (600 mg) at night   ipratropium-albuterol (DUONEB) 0.5-2.5 (3) MG/3ML SOLN 3 mLs, Nebulization, Every 6 hours PRN   meloxicam (MOBIC) 15 mg, Oral,  Daily PRN   OXYGEN 3 L, Inhalation, Continuous   pantoprazole (PROTONIX) 40 mg, Oral, Daily   polyethylene glycol (MIRALAX / GLYCOLAX) 17 g, Oral, Daily PRN    Diet Orders (From admission, onward)     Start     Ordered   09/03/22 0001  Diet NPO time specified  Diet effective midnight        09/02/22 1206            DVT prophylaxis: SCDs Start: 08/30/22 1621   Lab Results  Component Value Date   PLT 160 09/03/2022      Code Status: DNR  Family Communication: no family at bedside   Status is: Inpatient Remains inpatient appropriate because: GI bleed   Level of care: Progressive  Consultants:  GI  Objective: Vitals:   09/03/22 0311 09/03/22 0700 09/03/22 0743 09/03/22 0800  BP: 121/68   115/63  Pulse: (!) 103 (!) 102  94  Resp: 20 (!) 23  (!) 22  Temp: 97.7 F (36.5 C)   (!) 97.5 F (36.4 C)  TempSrc: Oral   Oral  SpO2: 100% 91% 95% 98%  Weight:      Height:        Intake/Output Summary (Last 24 hours) at 09/03/2022 1034 Last data filed at 09/02/2022 1232 Gross per 24 hour  Intake 399.83 ml  Output --  Net 399.83 ml   Wt Readings from Last 3 Encounters:  08/30/22 54.4 kg  02/24/20 56.7 kg  10/28/19 58 kg    Examination:  Somnolent, answering some questions, then go back to sleep, no apparent distress, she is legally blind. Symmetrical Chest wall movement, Good air movement bilaterally, CTAB RRR,No Gallops,Rubs or new Murmurs, No Parasternal Heave +ve B.Sounds, Abd Soft, No tenderness, No rebound - guarding or rigidity. No Cyanosis, Clubbing or edema, No new Rash or bruise      Data Reviewed: I have independently reviewed following labs and imaging studies  CBC Recent Labs  Lab 08/30/22 1350 08/30/22 1700 08/31/22 0102 08/31/22 1431 09/01/22 0620 09/02/22 0502 09/03/22 0359  WBC 11.1* 10.5 12.6* 12.3* 12.5* 12.2* 12.9*  HGB 9.5* 7.5* 9.0* 8.0* 7.6* 6.6* 10.1*  HCT 31.8* 23.5* 28.0* 26.1* 24.9* 22.2* 33.5*  PLT 149* 136* 115* 113*  112* 123* 160  MCV 99.7 97.1 92.4 95.6 97.3 100.5* 95.2  MCH 29.8 31.0 29.7 29.3 29.7 29.9 28.7  MCHC 29.9* 31.9 32.1 30.7 30.5 29.7* 30.1  RDW 13.4 13.4 15.9* 16.0* 15.6* 14.6 18.2*  LYMPHSABS 0.4* 0.5*  --   --   --   --   --   MONOABS 0.4 0.8  --   --   --   --   --   EOSABS 0.0 0.0  --   --   --   --   --   BASOSABS 0.0 0.0  --   --   --   --   --     Recent Labs  Lab 08/30/22 1300 08/30/22 1400 08/31/22 0102 09/01/22 0324 09/02/22 0502 09/03/22 0359  NA  --  139 143 139 138 138  K  --  5.0 4.8 4.8 4.7 5.4*  CL  --  95* 105 106 103 106  CO2  --  32 '31 24 30 27  '$ GLUCOSE  --  113* 124* 94 124* 131*  BUN  --  57* 68* 40* 21 20  CREATININE  --  0.82 0.75 0.69 0.82 0.72  CALCIUM  --  9.4 8.8* 8.5* 8.2* 8.4*  AST  --  24 19  --   --   --   ALT  --  19 15  --   --   --   ALKPHOS  --  87 64  --   --   --   BILITOT  --  0.7 0.7  --   --   --   ALBUMIN  --  3.6 2.9*  --   --   --   MG  --   --   --   --  1.8 1.9  INR 1.0  --   --   --   --   --     ------------------------------------------------------------------------------------------------------------------ No results for input(s): "CHOL", "HDL", "LDLCALC", "TRIG", "CHOLHDL", "LDLDIRECT" in the last 72 hours.  Lab Results  Component Value Date   HGBA1C 6.1 (H) 05/17/2015   ------------------------------------------------------------------------------------------------------------------ No results for input(s): "TSH", "T4TOTAL", "T3FREE", "THYROIDAB" in the last 72 hours.  Invalid input(s): "FREET3"  Cardiac Enzymes No results for input(s): "CKMB", "TROPONINI", "MYOGLOBIN" in the last 168 hours.  Invalid input(s): "CK" ------------------------------------------------------------------------------------------------------------------    Component Value Date/Time   BNP 440.9 (H) 05/14/2015 0553    CBG: No results for input(s): "GLUCAP" in the last 168 hours.  No results found for this or any previous visit (from  the past 240 hour(s)).   Radiology Studies: No results found.   Phillips Climes , MD [facility]  Between 7 am - 7 pm I am available, please contact me via Amion (for emergencies) or Securechat (non urgent messages)  Between 7 pm - 7 am I am not available, please contact night coverage MD/APP via Amion

## 2022-09-03 NOTE — Progress Notes (Signed)
PT Cancellation Note  Patient Details Name: Kelly Phillips MRN: [id number] DOB: October 10, 1945   Cancelled Treatment:    Reason Eval/Treat Not Completed: Patient at procedure or test/unavailable - Pt at EGD, will check back as able.   Stacie Glaze, PT DPT Acute Rehabilitation Services Pager [phone]  Office [phone]    Louis Matte 09/03/2022, 2:42 PM

## 2022-09-04 ENCOUNTER — Other Ambulatory Visit (HOSPITAL_COMMUNITY): Payer: Self-pay

## 2022-09-04 DIAGNOSIS — K219 Gastro-esophageal reflux disease without esophagitis: Secondary | ICD-10-CM | POA: Diagnosis not present

## 2022-09-04 DIAGNOSIS — K922 Gastrointestinal hemorrhage, unspecified: Secondary | ICD-10-CM | POA: Diagnosis not present

## 2022-09-04 LAB — CBC
HCT: 31.4 % — ABNORMAL LOW (ref 36.0–46.0)
Hemoglobin: 9.8 g/dL — ABNORMAL LOW (ref 12.0–15.0)
MCH: 29.3 pg (ref 26.0–34.0)
MCHC: 31.2 g/dL (ref 30.0–36.0)
MCV: 93.7 fL (ref 80.0–100.0)
Platelets: 166 10*3/uL (ref 150–400)
RBC: 3.35 MIL/uL — ABNORMAL LOW (ref 3.87–5.11)
RDW: 16.7 % — ABNORMAL HIGH (ref 11.5–15.5)
WBC: 11.3 10*3/uL — ABNORMAL HIGH (ref 4.0–10.5)
nRBC: 0.4 % — ABNORMAL HIGH (ref 0.0–0.2)

## 2022-09-04 LAB — BASIC METABOLIC PANEL
Anion gap: 11 (ref 5–15)
BUN: 20 mg/dL (ref 8–23)
CO2: 28 mmol/L (ref 22–32)
Calcium: 9 mg/dL (ref 8.9–10.3)
Chloride: 102 mmol/L (ref 98–111)
Creatinine, Ser: 0.67 mg/dL (ref 0.44–1.00)
GFR, Estimated: >60 mL/min (ref >60)
Glucose, Bld: 97 mg/dL (ref 70–99)
Potassium: 3.8 mmol/L (ref 3.5–5.1)
Sodium: 141 mmol/L (ref 135–145)

## 2022-09-04 MED ORDER — PANTOPRAZOLE SODIUM 40 MG PO TBEC
40.0000 mg | DELAYED_RELEASE_TABLET | Freq: Two times a day (BID) | ORAL | 2 refills | Status: AC
  Filled 2022-09-04: qty 60, 30d supply, fill #0

## 2022-09-04 MED ORDER — METOPROLOL TARTRATE 5 MG/5ML IV SOLN: 2.5000 mg | Freq: Once | INTRAVENOUS | Status: DC

## 2022-09-04 MED ORDER — METOPROLOL TARTRATE 5 MG/5ML IV SOLN
2.5000 mg | Freq: Once | INTRAVENOUS | Status: AC
  Administered 2022-09-04: 2.5 mg via INTRAVENOUS
  Filled 2022-09-04: qty 5

## 2022-09-04 MED ORDER — METOPROLOL TARTRATE 25 MG PO TABS
25.0000 mg | ORAL_TABLET | Freq: Two times a day (BID) | ORAL | Status: DC
  Administered 2022-09-04 (×2): 25 mg via ORAL
  Filled 2022-09-04 (×2): qty 1

## 2022-09-04 MED ORDER — GABAPENTIN 300 MG PO CAPS: 300.0000 mg | ORAL_CAPSULE | Freq: Every day | ORAL | Status: AC

## 2022-09-04 MED ORDER — METOPROLOL TARTRATE 25 MG PO TABS
25.0000 mg | ORAL_TABLET | Freq: Two times a day (BID) | ORAL | 0 refills | Status: DC
  Filled 2022-09-04: qty 60, 30d supply, fill #0

## 2022-09-04 NOTE — NC FL2 (Signed)
[place] LEVEL OF CARE FORM     IDENTIFICATION  Patient Name: Kelly Phillips Birthdate: August 17, 1946 Sex: female Admission Date (Current Location): 08/30/2022  [facility] and [place] Number:  Herbalist and Address:  [facility] [place] [address], [place], [place] [address]      Provider Number: [id number]  Attending Physician Name and Address:  Elgergawy, Silver Huguenin, MD  Relative Name and Phone Number:       Current Level of Care: Hospital Recommended Level of Care: [place] Prior Approval Number:    Date Approved/Denied:   PASRR Number: [id number]  Discharge Plan: SNF    Current Diagnoses: Patient Active Problem List   Diagnosis Date Noted   Acute upper GI bleeding 08/30/2022   Upper GI bleed 08/27/2020   Acute lower GI bleeding 08/26/2020   Chronic respiratory failure with hypoxia ([place]) 08/26/2020   GERD without esophagitis 08/26/2020   Heartburn 10/28/2019   Shortness of breath 10/28/2019   Status post aortobifemoral bypass surgery 09/09/2019   Tachycardia 09/09/2019   Acute blood loss anemia    Thrombocytopenia (HCC)    Exercise hypoxemia 07/10/2016   Benign essential hypertension 01/17/2016   Bilateral carotid artery stenosis 01/17/2016   Former smoker 01/17/2016   Superior mesenteric artery stenosis ([place]) 01/17/2016   Multiple lung nodules on CT 12/28/2015   Atherosclerosis of native artery of extremity with intermittent claudication ([place]) 11/22/2015   Chronic mesenteric insufficiency (HCC) 11/22/2015   Edema of extremities 11/22/2015   Ventilator dependence, reintubated 9/7 for return to OR for thrombectomy, Massive volume overload, may need trach 05/11/2015   History of smoking 05/11/2015   Peripheral vascular disease, unspecified ([place]) 05/11/2015   Arthritis 05/11/2015   Macular degeneration 05/11/2015   Aortic occlusion ([place]) 05/02/2015   COPD (chronic obstructive pulmonary disease) with  emphysema ([place]) 05/02/2015   Aortic thromboembolism ([place]) 04/04/2015    Orientation RESPIRATION BLADDER Height & Weight     Self, Time, Situation, Place  O2 (4L nasal cannula) Continent, External catheter Weight: 120 lb (54.4 kg) Height:  '5\' 3"'$  (160 cm)  BEHAVIORAL SYMPTOMS/MOOD NEUROLOGICAL BOWEL NUTRITION STATUS      Continent Diet (See dc summary)  AMBULATORY STATUS COMMUNICATION OF NEEDS Skin   Limited Assist Verbally Normal                       Personal Care Assistance Level of Assistance  Bathing, Feeding, Dressing Bathing Assistance: Limited assistance Feeding assistance: Limited assistance Dressing Assistance: Limited assistance     Functional Limitations Info  Sight Sight Info: Impaired        SPECIAL CARE FACTORS FREQUENCY  PT (By licensed PT), OT (By licensed OT)     PT Frequency: 5x/week OT Frequency: 5x/week            Contractures Contractures Info: Not present    Additional Factors Info  Code Status, Allergies Code Status Info: DNR Allergies Info: Codeine, Salmon (Fish Allergy), Penicillins, Aspirin           Current Medications (09/04/2022):  This is the current hospital active medication list Current Facility-Administered Medications  Medication Dose Route Frequency Provider Last Rate Last Admin   acetaminophen (TYLENOL) tablet 650 mg  650 mg Oral Q6H PRN Clarene Essex, MD       Or   acetaminophen (TYLENOL) suppository 650 mg  650 mg Rectal Q6H PRN Clarene Essex, MD       ALPRAZolam Duanne Moron) tablet  0.25 mg  0.25 mg Oral BID PRN Caren Griffins, MD   0.25 mg at 09/03/22 2125   budesonide (PULMICORT) nebulizer solution 0.25 mg  0.25 mg Nebulization BID Clarene Essex, MD   0.25 mg at 09/04/22 6606   gabapentin (NEURONTIN) capsule 300 mg  300 mg Oral QHS Elgergawy, Silver Huguenin, MD   300 mg at 09/03/22 2125   ipratropium-albuterol (DUONEB) 0.5-2.5 (3) MG/3ML nebulizer solution 3 mL  3 mL Nebulization Q6H PRN Clarene Essex, MD   3 mL at 09/04/22 0534    metoprolol tartrate (LOPRESSOR) tablet 25 mg  25 mg Oral BID Elgergawy, Silver Huguenin, MD   25 mg at 09/04/22 1102   ondansetron (ZOFRAN) injection 4 mg  4 mg Intravenous Q6H PRN Clarene Essex, MD   4 mg at 09/01/22 1721   Oral care mouth rinse  15 mL Mouth Rinse PRN Elgergawy, Silver Huguenin, MD       pantoprazole (PROTONIX) injection 40 mg  40 mg Intravenous Q12H Clarene Essex, MD   40 mg at 09/04/22 0045   polyethylene glycol (MIRALAX / GLYCOLAX) packet 17 g  17 g Oral Daily PRN Clarene Essex, MD       sodium chloride flush (NS) 0.9 % injection 3 mL  3 mL Intravenous Q12H Clarene Essex, MD   3 mL at 09/04/22 9977     Discharge Medications: Please see discharge summary for a list of discharge medications.  Relevant Imaging Results:  Relevant Lab Results:   Additional Information SSN:  [id number]  [place], LCSW

## 2022-09-04 NOTE — TOC Transition Note (Signed)
Transition of Care [facility]) - CM/SW Discharge Note   Patient Details  Name: Kelly Phillips MRN: [id number] Date of Birth: [birth year]  [facility] CM/SW Contact:  Verdell Carmine, RN Phone Number: 09/04/2022, 11:49 AM   Clinical Narrative:    Patient will be DC today. She has a caretaker at home. She has oxygen at home through [employer], they will bring a tank up for transport.  No listing of previous home health PT OT. Will give listing to patient from medicare. Gov.         Patient Goals and CMS Choice      Discharge Placement                         Discharge Plan and Services Additional resources added to the After Visit Summary for                  DME Arranged: Oxygen DME Agency: [employer] Date DME Agency Contacted: 09/04/22 Time DME Agency Contacted: [id number] Representative spoke with at DME Agency: Lawrence [place] is active with oxygen they will send a tank up to get her back home.            Social Determinants of Health (SDOH) Interventions SDOH Screenings   Food Insecurity: No Food Insecurity (08/31/2022)  Housing: Low Risk  (08/31/2022)  Transportation Needs: No Transportation Needs (08/31/2022)  Utilities: Not At Risk (08/31/2022)  Depression (PHQ2-9): Low Risk  (09/22/2019)  Tobacco Use: Medium Risk (09/03/2022)     Readmission Risk Interventions     No data to display

## 2022-09-04 NOTE — Progress Notes (Signed)
PROGRESS NOTE  Kelly Phillips CWC:[id number] DOB: 1945-11-14 DOA: 08/30/2022 PCP: Emmaline Kluver, MD   LOS: 5 days   Brief Narrative / Interim history:  77 year old female with prior GI bleed due to PUD, PAD, mesenteric artery stenosis, CAD, COPD, chronic hypoxic respiratory failure, legal blindness comes into the hospital with hematemesis.  This was associated with epigastric pain.  Caregiver witnessed some blood in the vomit, patient is blind but could feel the taste of blood.  GI consulted and she was admitted to the hospital  Subjective / 24h Interval events:  No significant events overnight, no bowel movements overnight, she denies any complaints today.   Assesement and Plan: Principal Problem:   Acute upper GI bleeding Active Problems:   Chronic respiratory failure with hypoxia (HCC)   GERD without esophagitis   Peripheral vascular disease, unspecified (HCC)   Status post aortobifemoral bypass surgery   Benign essential hypertension   Bilateral carotid artery stenosis   Superior mesenteric artery stenosis (HCC)   COPD (chronic obstructive pulmonary disease) with emphysema (HCC)    Upper GI bleed  - patient with history of PUD, most recent endoscopy in 2021.  She is drinking EtOH daily, although small amount, but using NSAIDs every day.  GI consulted, underwent an EGD on 1/7 which showed large amount of probably some food and old blood in the stomach, nonbleeding gastric ulcer with no stigmata of bleeding, normal duodenum.  -Hemoglobin dropped again  to 6.6, requiring unit PRBC with great response at 10.1. she went for repeat endoscopy 11/10, which was significant for pyloric stenosis, likely related to ulcers, status post balloon dilation, and evidence of multiple ulcers,Gastric cardia, antrum, pyloric channel, pylorus-all clean-based and nonbleeding  . -Discussed with GI, she is okay to resume her regular diet, continue with Protonix 40 mg twice daily, patient  was  instructed to hold NSAIDs and will hold aspirin for 2 weeks, to be resumed after that if her hemoglobin remained stable. -He was transfused total of 2 units PRBC during hospital stay  Acute blood loss anemia  - she required 2 units PRBC    PAD, CAD, mesenteric artery stenosis - status post aortofemoral bypass.  Hold home aspirin for now, resume in 2 weeks Macular degeneration, legal blindness - noted  Somnolence  patient has been somnolent with poor oral intake over last 24 hours, restarted gabapentin at bedtime from 600-300, and will hold it for daytime.  COPD with chronic hypoxic respiratory failure, 3.5 L at home- respiratory status is at baseline but subjectively she is feeling more short of breath, possibly in the setting of anemia.  Chest x-ray yesterday unremarkable.  Continue supplemental oxygen  Scheduled Meds:  budesonide (PULMICORT) nebulizer solution  0.25 mg Nebulization BID   gabapentin  300 mg Oral QHS   metoprolol tartrate  25 mg Oral BID   pantoprazole (PROTONIX) IV  40 mg Intravenous Q12H   sodium chloride flush  3 mL Intravenous Q12H   Continuous Infusions:   PRN Meds:.acetaminophen **OR** acetaminophen, ALPRAZolam, ipratropium-albuterol, ondansetron (ZOFRAN) IV, mouth rinse, polyethylene glycol  Current Outpatient Medications  Medication Instructions   ALPRAZolam (XANAX) 0.25-0.5 mg, Oral, See admin instructions, Take 1/2 tablet (0.25 mg) by mouth every morning and 1 tablet (0.5 mg) at night   amLODipine-benazepril (LOTREL) 5-10 MG capsule 1 capsule, Oral, Daily at bedtime   aspirin EC 81 mg, Oral, Daily at bedtime, Swallow whole.   baclofen (LIORESAL) 10 mg, Oral, 2 times daily PRN   docusate sodium (COLACE) 100-200  mg, Oral, See admin instructions, Take one capsule (100 mg) by mouth every other night at bedtime, take two capsules (200 mg) on alternate nights   fluticasone (FLOVENT HFA) 110 MCG/ACT inhaler 2 puffs, Inhalation, Daily   furosemide (LASIX) 20 mg,  Oral, Daily PRN   gabapentin (NEURONTIN) 300 mg, Oral, Daily at bedtime, Take one capsule (300 mg) by mouth after lunch and two capsules (600 mg) at night   ipratropium-albuterol (DUONEB) 0.5-2.5 (3) MG/3ML SOLN 3 mLs, Nebulization, Every 6 hours PRN   meloxicam (MOBIC) 15 mg, Oral, Daily PRN   metoprolol tartrate (LOPRESSOR) 25 mg, Oral, 2 times daily   OXYGEN 3 L, Inhalation, Continuous   pantoprazole (PROTONIX) 40 mg, Oral, 2 times daily   polyethylene glycol (MIRALAX / GLYCOLAX) 17 g, Oral, Daily PRN    Diet Orders (From admission, onward)     Start     Ordered   09/04/22 0000  Diet - low sodium heart healthy        09/04/22 1144   09/03/22 1944  Diet regular Room service appropriate? Yes; Fluid consistency: Thin  Diet effective now       Question Answer Comment  Room service appropriate? Yes   Fluid consistency: Thin      09/03/22 1943            DVT prophylaxis: SCDs Start: 08/30/22 1621   Lab Results  Component Value Date   PLT 166 09/04/2022      Code Status: DNR  Family Communication: no family at bedside   Status is: Inpatient Remains inpatient appropriate because: GI bleed   Level of care: Progressive  Consultants:  GI  Objective: Vitals:   09/04/22 0331 09/04/22 0534 09/04/22 0800 09/04/22 1000  BP: (!) 140/77  (!) 151/65 (!) 150/90  Pulse: (!) 104  (!) 107 69  Resp: (!) '27  20 20  '$ Temp: 97.6 F (36.4 C)     TempSrc: Oral     SpO2: 100% 91% 99% 97%  Weight:      Height:        Intake/Output Summary (Last 24 hours) at 09/04/2022 1613 Last data filed at 09/04/2022 0500 Gross per 24 hour  Intake --  Output 1450 ml  Net -1450 ml   Wt Readings from Last 3 Encounters:  08/30/22 54.4 kg  02/24/20 56.7 kg  10/28/19 58 kg    Examination:  Awake Alert, Oriented X 3, frail, legally blind. Symmetrical Chest wall movement, Good air movement bilaterally, CTAB RRR,No Gallops,Rubs or new Murmurs, No Parasternal Heave +ve B.Sounds, Abd Soft,  No tenderness, No rebound - guarding or rigidity. No Cyanosis, Clubbing or edema, No new Rash or bruise      Data Reviewed: I have independently reviewed following labs and imaging studies  CBC Recent Labs  Lab 08/30/22 1350 08/30/22 1700 08/31/22 0102 08/31/22 1431 09/01/22 0620 09/02/22 0502 09/03/22 0359 09/04/22 0431  WBC 11.1* 10.5   < > 12.3* 12.5* 12.2* 12.9* 11.3*  HGB 9.5* 7.5*   < > 8.0* 7.6* 6.6* 10.1* 9.8*  HCT 31.8* 23.5*   < > 26.1* 24.9* 22.2* 33.5* 31.4*  PLT 149* 136*   < > 113* 112* 123* 160 166  MCV 99.7 97.1   < > 95.6 97.3 100.5* 95.2 93.7  MCH 29.8 31.0   < > 29.3 29.7 29.9 28.7 29.3  MCHC 29.9* 31.9   < > 30.7 30.5 29.7* 30.1 31.2  RDW 13.4 13.4   < > 16.0* 15.6* 14.6  18.2* 16.7*  LYMPHSABS 0.4* 0.5*  --   --   --   --   --   --   MONOABS 0.4 0.8  --   --   --   --   --   --   EOSABS 0.0 0.0  --   --   --   --   --   --   BASOSABS 0.0 0.0  --   --   --   --   --   --    < > = values in this interval not displayed.    Recent Labs  Lab 08/30/22 1300 08/30/22 1400 08/30/22 1400 08/31/22 0102 09/01/22 0324 09/02/22 0502 09/03/22 0359 09/04/22 0431  NA  --  139   < > 143 139 138 138 141  K  --  5.0   < > 4.8 4.8 4.7 5.4* 3.8  CL  --  95*   < > 105 106 103 106 102  CO2  --  32   < > '31 24 30 27 28  '$ GLUCOSE  --  113*   < > 124* 94 124* 131* 97  BUN  --  57*   < > 68* 40* '21 20 20  '$ CREATININE  --  0.82   < > 0.75 0.69 0.82 0.72 0.67  CALCIUM  --  9.4   < > 8.8* 8.5* 8.2* 8.4* 9.0  AST  --  24  --  19  --   --   --   --   ALT  --  19  --  15  --   --   --   --   ALKPHOS  --  87  --  64  --   --   --   --   BILITOT  --  0.7  --  0.7  --   --   --   --   ALBUMIN  --  3.6  --  2.9*  --   --   --   --   MG  --   --   --   --   --  1.8 1.9  --   INR 1.0  --   --   --   --   --   --   --    < > = values in this interval not displayed.     ------------------------------------------------------------------------------------------------------------------ No results for input(s): "CHOL", "HDL", "LDLCALC", "TRIG", "CHOLHDL", "LDLDIRECT" in the last 72 hours.  Lab Results  Component Value Date   HGBA1C 6.1 (H) 05/17/2015   ------------------------------------------------------------------------------------------------------------------ No results for input(s): "TSH", "T4TOTAL", "T3FREE", "THYROIDAB" in the last 72 hours.  Invalid input(s): "FREET3"  Cardiac Enzymes No results for input(s): "CKMB", "TROPONINI", "MYOGLOBIN" in the last 168 hours.  Invalid input(s): "CK" ------------------------------------------------------------------------------------------------------------------    Component Value Date/Time   BNP 440.9 (H) 05/14/2015 0553    CBG: No results for input(s): "GLUCAP" in the last 168 hours.  No results found for this or any previous visit (from the past 240 hour(s)).   Radiology Studies: No results found.   Phillips Climes , MD [facility]  Between 7 am - 7 pm I am available, please contact me via Amion (for emergencies) or Securechat (non urgent messages)  Between 7 pm - 7 am I am not available, please contact night coverage MD/APP via Amion

## 2022-09-04 NOTE — Discharge Summary (Signed)
Physician Discharge Summary  Kelly Phillips JIR:[id number] DOB: 1945-10-26 DOA: 08/30/2022  PCP: Emmaline Kluver, MD  Admit date: 08/30/2022 Discharge date: 09/05/2022  Admitted From: (Home) Disposition:  (Home)  Recommendations for Outpatient Follow-up:  Follow up with PCP in 1-2 weeks Please obtain BMP/CBC in one week To hold aspirin for next 2 weeks, this can be resumed if her CBC is stable in 2 weeks to Consider starting metoprolol when blood pressure allows, as well taper off midodrine. To continue Protonix 40 mg oral twice daily x 8 weeks then once daily lifelong after that  Home Health: (YES) Equipment/Devices: Home oxygen 3 L nasal cannula at baseline  Discharge Condition: (Stable) CODE STATUS: (FULL) Diet recommendation: Heart Healthy   Brief/Interim Summary:  77 year old female with prior GI bleed due to PUD, PAD, mesenteric artery stenosis, CAD, COPD, chronic hypoxic respiratory failure, legal blindness comes into the hospital with hematemesis. This was associated with epigastric pain. Caregiver witnessed some blood in the vomit, patient is blind but could feel the taste of blood. GI consulted and she was admitted to the hospital      Upper GI bleed  Acute blood loss anemia Multiple gastric ulcers - patient with history of PUD, most recent endoscopy in 2021.  She is drinking EtOH daily, although small amount, but using NSAIDs every day.  As well she is on aspirin, GI consulted, underwent an EGD on 1/7 which showed large amount of probably some food and old blood in the stomach, nonbleeding gastric ulcer with no stigmata of bleeding, normal duodenum.  Hemoglobin did drop again after procedure down to 6.6, for which she required 1 unit PRBC(8 rebounded nicely to 10.1, so most likely the drop was due to erroneous reading), she had no hematemesis or melena, she went for repeat endoscopy 11/10, which was significant for pyloric stenosis, likely related to ulcers, status post  balloon dilation, and evidence of multiple ulcers,Gastric cardia, antrum, pyloric channel, pylorus-all clean-based and nonbleeding  -Discussed with GI, she is okay to resume her regular diet, continue with Protonix 40 mg twice daily, patient  was instructed to hold NSAIDs and will hold aspirin for 2 weeks, to be resumed after that if her hemoglobin remained stable. -He was transfused total of 2 units PRBC during hospital stay  Pyloric stenosis -Likely due to ulcers, status post balloon dilation.  Hypertension -Overall her blood pressure has been soft, so her meds has been discontinued.  He was briefly on metoprolol for sinus tachycardia, but this has been discontinued given soft blood pressure, she was started on low-dose midodrine, this can be tapered off as an outpatient.   PAD, CAD, mesenteric artery stenosis -  status post aortofemoral bypass.  Patient has been held during hospital stay, it can be resumed in 2 weeks as an outpatient if hemoglobin remained stable, I have emphasized with her again to avoid all forms of NSAIDs   Macular degeneration, legal blindness - noted   Somnolence  patient has been somnolent with poor oral intake. -Her gabapentin dose has been adjusted, DC daytime gabapentin, and decreased night  time gabapentin dose from '600mg'$  to 300 mg.   COPD with chronic hypoxic respiratory failure, 3.5 L at home- respiratory status is at baseline but subjectively she is feeling more short of breath, possibly in the setting of anemia.  Chest x-ray  unremarkable.  Continue supplemental oxygen    Discharge Diagnoses:  Principal Problem:   Acute upper GI bleeding Active Problems:   Chronic respiratory failure with  hypoxia (HCC)   GERD without esophagitis   Peripheral vascular disease, unspecified ([place])   Status post aortobifemoral bypass surgery   Benign essential hypertension   Bilateral carotid artery stenosis   Superior mesenteric artery stenosis (HCC)   COPD (chronic  obstructive pulmonary disease) with emphysema [facility])    Discharge Instructions  Discharge Instructions     Diet - low sodium heart healthy   Complete by: As directed    Discharge instructions   Complete by: As directed    Follow with Primary MD Street, Sharon [place], MD in 10 days   Get CBC, CMP,checked  by Primary MD next visit.    Activity: As tolerated with Full fall precautions use walker/cane & assistance as needed   Disposition Home    Diet: Heart Healthy  , with feeding assistance and aspiration precautions.  On your next visit with your primary care physician please Get Medicines reviewed and adjusted.   Please request your Prim.MD to go over all Hospital Tests and Procedure/Radiological results at the follow up, please get all Hospital records sent to your Prim MD by signing hospital release before you go home.   If you experience worsening of your admission symptoms, develop shortness of breath, life threatening emergency, suicidal or homicidal thoughts you must seek medical attention immediately by calling 911 or calling your MD immediately  if symptoms less severe.  You Must read complete instructions/literature along with all the possible adverse reactions/side effects for all the Medicines you take and that have been prescribed to you. Take any new Medicines after you have completely understood and accpet all the possible adverse reactions/side effects.   Do not drive, operating heavy machinery, perform activities at heights, swimming or participation in water activities or provide baby sitting services if your were admitted for syncope or siezures until you have seen by Primary MD or a Neurologist and advised to do so again.  Do not drive when taking Pain medications.    Do not take more than prescribed Pain, Sleep and Anxiety Medications  Special Instructions: If you have smoked or chewed Tobacco  in the last 2 yrs please stop smoking, stop any regular Alcohol   and or any Recreational drug use.  Wear Seat belts while driving.   Please note  You were cared for by a hospitalist during your hospital stay. If you have any questions about your discharge medications or the care you received while you were in the hospital after you are discharged, you can call the unit and asked to speak with the hospitalist on call if the hospitalist that took care of you is not available. Once you are discharged, your primary care physician will handle any further medical issues. Please note that NO REFILLS for any discharge medications will be authorized once you are discharged, as it is imperative that you return to your primary care physician (or establish a relationship with a primary care physician if you do not have one) for your aftercare needs so that they can reassess your need for medications and monitor your lab values.   Increase activity slowly   Complete by: As directed       Allergies as of 09/05/2022       Reactions   Codeine Swelling   Facial swelling   Salmon [fish Allergy] Anaphylaxis, Swelling   Penicillins Nausea And Vomiting   Aspirin Tinitus   If she takes too much        Medication List  STOP taking these medications    amLODipine-benazepril 5-10 MG capsule Commonly known as: LOTREL   aspirin EC 81 MG tablet   meloxicam 15 MG tablet Commonly known as: MOBIC       TAKE these medications    ALPRAZolam 0.5 MG tablet Commonly known as: XANAX Take 0.25-0.5 mg by mouth See admin instructions. Take 1/2 tablet (0.25 mg) by mouth every morning and 1 tablet (0.5 mg) at night   baclofen 20 MG tablet Commonly known as: LIORESAL Take 10 mg by mouth 2 (two) times daily as needed for muscle spasms.   docusate sodium 100 MG capsule Commonly known as: COLACE Take 100-200 mg by mouth See admin instructions. Take one capsule (100 mg) by mouth every other night at bedtime, take two capsules (200 mg) on alternate nights   fluticasone  110 MCG/ACT inhaler Commonly known as: FLOVENT HFA Inhale 2 puffs into the lungs daily.   furosemide 20 MG tablet Commonly known as: LASIX Take 20 mg by mouth daily as needed for fluid.   gabapentin 300 MG capsule Commonly known as: NEURONTIN Take 1 capsule (300 mg total) by mouth at bedtime. Take one capsule (300 mg) by mouth after lunch and two capsules (600 mg) at night What changed:  how much to take when to take this   ipratropium-albuterol 0.5-2.5 (3) MG/3ML Soln Commonly known as: DUONEB Take 3 mLs by nebulization every 6 (six) hours as needed (shortness of breath).   midodrine 5 MG tablet Commonly known as: PROAMATINE Take 1 tablet (5 mg total) by mouth 3 (three) times daily with meals.   OXYGEN Inhale 3 L into the lungs continuous.   pantoprazole 40 MG tablet Commonly known as: Protonix Take 1 tablet (40 mg total) by mouth 2 (two) times daily. What changed: when to take this   polyethylene glycol 17 g packet Commonly known as: MIRALAX / GLYCOLAX Take 17 g by mouth daily as needed for mild constipation or moderate constipation.               Durable Medical Equipment  (From admission, onward)           Start     Ordered   09/04/22 1027  For home use only DME oxygen  Once       Question Answer Comment  Length of Need Lifetime   Mode or (Route) Nasal cannula   Liters per Minute 4   Frequency Continuous (stationary and portable oxygen unit needed)   Oxygen conserving device Yes   Oxygen delivery system Gas      09/04/22 1026            Contact information for after-discharge care     [facility] .   Service: Skilled Nursing Contact information: [address] [place] [address] [phone]                    Allergies  Allergen Reactions   Codeine Swelling    Facial swelling   Dani Gobble [Fish Allergy] Anaphylaxis and Swelling   Penicillins Nausea And  Vomiting   Aspirin Tinitus    If she takes too much    Consultations: GI    Procedures/Studies: DG CHEST PORT 1 VIEW  Result Date: 09/01/2022 CLINICAL DATA:  Dyspnea EXAM: PORTABLE CHEST 1 VIEW COMPARISON:  08/30/2022 FINDINGS: Lungs are hyperinflated as can be seen with COPD. Right upper lobe pulmonary nodule seen on the CT  chest dated 03/28/2022 is not well visualized. No focal consolidation. No pleural effusion or pneumothorax. Heart and mediastinal contours are unremarkable. No acute osseous abnormality. IMPRESSION: 1. No acute cardiopulmonary disease. 2. Right upper lobe pulmonary nodule seen on the CT chest dated 03/28/2022 is not well visualized. Electronically Signed   By: Kathreen Devoid M.D.   On: 09/01/2022 10:00   DG Chest 2 View  Result Date: 08/30/2022 CLINICAL DATA:  SOB chronic cough EXAM: CHEST - 2 VIEW COMPARISON:  07/21/2020. FINDINGS: The heart size and mediastinal contours are within normal limits. Lungs are hyperinflated suggesting COPD. There is no focal consolidation. No pneumothorax or pleural effusion. Aorta is calcified. The visualized skeletal structures are unremarkable. IMPRESSION: Findings suggest COPD.  Otherwise no active cardiopulmonary disease. Electronically Signed   By: Sammie Bench M.D.   On: 08/30/2022 15:55     [facility] Patient Name: Willine Schwalbe Procedure Date : 09/03/2022 MRN: [id number] Attending MD: Ronnette Juniper , MD, [id number] Date of Birth: [birth year] CSN: [id number] Age: 77 Admit Type: Inpatient Procedure:                Upper GI endoscopy Indications:              Acute post hemorrhagic anemia, Suspected upper                            gastrointestinal bleeding Providers:                Ronnette Juniper, MD, Jaci Carrel, RN, Fransico Setters                            [place], Technician Referring MD:             [facility] Medicines:                Monitored Anesthesia Care Complications:            No immediate  complications. Estimated blood loss:                            Minimal. Estimated Blood Loss:     Estimated blood loss was minimal. Procedure:                Pre-Anesthesia Assessment:                           - Prior to the procedure, a History and Physical                            was performed, and patient medications and                            allergies were reviewed. The patient's tolerance of                            previous anesthesia was also reviewed. The risks                            and benefits of the procedure and the sedation  options and risks were discussed with the patient.                            All questions were answered, and informed consent                            was obtained. Prior Anticoagulants: The patient has                            taken no anticoagulant or antiplatelet agents                            except for aspirin. ASA Grade Assessment: IV - A                            patient with severe systemic disease that is a                            constant threat to life. After reviewing the risks                            and benefits, the patient was deemed in                            satisfactory condition to undergo the procedure.                           After obtaining informed consent, the endoscope was                            passed under direct vision. Throughout the                            procedure, the patient's blood pressure, pulse, and                            oxygen saturations were monitored continuously. The                            [id number]-H190 ([id number]) Olympus endoscope was introduced                            through the mouth, and advanced to the second part                            of duodenum. The upper GI endoscopy was                            accomplished without difficulty. The patient                            tolerated the procedure well. Scope In: Scope  Out: Findings:      The upper third of the esophagus, middle third  of the esophagus and       lower third of the esophagus were normal.      A widely patent and non-obstructing Schatzki ring was found at the       gastroesophageal junction.      Six non-bleeding cratered, linear and superficial gastric ulcers with a       clean ulcer base (Forrest Class III) were found in the cardia, on the       lesser curvature of the gastric body, in the gastric antrum, in the       prepyloric region of the stomach and at the pylorus. The largest lesion       was 10 mm in largest dimension.      A benign-appearing, intrinsic moderate stenosis was found at the       pylorus. This was traversed. A TTS dilator was passed through the scope.       Dilation with a 15-16.5-18 mm balloon dilator was performed to 18 mm.       The dilation site was examined following endoscope reinsertion and       showed moderate mucosal disruption and complete resolution of luminal       narrowing.      The examined duodenum was normal.      Patient recently had an EGD with biopsies on 08/31/22( 3 days ago) and did       not have evidence of H pylori or intestinal metaplasia. Impression:               - Normal upper third of esophagus, middle third of                            esophagus and lower third of esophagus.                           - Widely patent and non-obstructing Schatzki ring.                           - Non-bleeding gastric ulcers with a clean ulcer                            base (Forrest Class III).                           - Gastric stenosis was found at the pylorus.                            Dilated.                           - Normal examined duodenum.                           - No specimens collected. Moderate Sedation:      Patient did not receive moderate sedation for this procedure, but       instead received monitored anesthesia care. Recommendation:           - Resume regular diet.                            -  Continue present medications.                           - Will need PPI BID for 2 months, avoid ASA and                            NSAIDs. Procedure Code(s):        --- Professional ---                           [phone], Esophagogastroduodenoscopy, flexible,                            transoral; with dilation of gastric/duodenal                            stricture(s) (eg, balloon, bougie) Diagnosis Code(s):        --- Professional ---                           K22.2, Esophageal obstruction                           K25.9, Gastric ulcer, unspecified as acute or                            chronic, without hemorrhage or perforation                           K31.1, Adult hypertrophic pyloric stenosis                           D62, Acute posthemorrhagic anemia CPT copyright 2022 American [employer] Association. All rights reserved. The codes documented in this report are preliminary and upon coder review may  be revised to meet current compliance requirements. Ronnette Juniper, MD 09/03/2022 3:36:06 PM This report has been signed electronically. Number of Addenda: 0 Subjective:  No significant events overnight, she denies any complaints today Discharge Exam: Vitals:   09/05/22 0835 09/05/22 1300  BP: 91/79 102/70  Pulse: 88 80  Resp: (!) 23 (!) 21  Temp:  97.6 F (36.4 C)  SpO2: 100% 100%   Vitals:   09/05/22 0825 09/05/22 0830 09/05/22 0835 09/05/22 1300  BP:   91/79 102/70  Pulse: 81 84 88 80  Resp: 19 17 (!) 23 (!) 21  Temp:    97.6 F (36.4 C)  TempSrc:    Oral  SpO2: 100% 100% 100% 100%  Weight:      Height:         Awake Alert, Oriented X 3, frail, legally blind Symmetrical Chest wall movement, Good air movement bilaterally, CTAB RRR,No Gallops,Rubs or new Murmurs, No Parasternal Heave +ve B.Sounds, Abd Soft, No tenderness, No rebound - guarding or rigidity. No Cyanosis, Clubbing or edema, No new Rash or bruise      The results of significant diagnostics  from this hospitalization (including imaging, microbiology, ancillary and laboratory) are listed below for reference.     Microbiology: No results found for this or any previous visit (from the past 240 hour(s)).   Labs: BNP (last 3 results) No results  for input(s): "BNP" in the last 8760 hours. Basic Metabolic Panel: Recent Labs  Lab 09/01/22 0324 09/02/22 0502 09/03/22 0359 09/04/22 0431 09/05/22 0529  NA 139 138 138 141 140  K 4.8 4.7 5.4* 3.8 3.2*  CL 106 103 106 102 101  CO2 '24 30 27 28 31  '$ GLUCOSE 94 124* 131* 97 94  BUN 40* '21 20 20 16  '$ CREATININE 0.69 0.82 0.72 0.67 0.57  CALCIUM 8.5* 8.2* 8.4* 9.0 8.6*  MG  --  1.8 1.9  --   --    Liver Function Tests: Recent Labs  Lab 08/30/22 1400 08/31/22 0102  AST 24 19  ALT 19 15  ALKPHOS 87 64  BILITOT 0.7 0.7  PROT 6.6 5.2*  ALBUMIN 3.6 2.9*   Recent Labs  Lab 08/30/22 1400  LIPASE 35   No results for input(s): "AMMONIA" in the last 168 hours. CBC: Recent Labs  Lab 08/30/22 1350 08/30/22 1700 08/31/22 0102 09/01/22 0620 09/02/22 0502 09/03/22 0359 09/04/22 0431 09/05/22 0529  WBC 11.1* 10.5   < > 12.5* 12.2* 12.9* 11.3* 7.5  NEUTROABS 10.2* 9.0*  --   --   --   --   --   --   HGB 9.5* 7.5*   < > 7.6* 6.6* 10.1* 9.8* 8.6*  HCT 31.8* 23.5*   < > 24.9* 22.2* 33.5* 31.4* 27.6*  MCV 99.7 97.1   < > 97.3 100.5* 95.2 93.7 94.2  PLT 149* 136*   < > 112* 123* 160 166 144*   < > = values in this interval not displayed.   Cardiac Enzymes: No results for input(s): "CKTOTAL", "CKMB", "CKMBINDEX", "TROPONINI" in the last 168 hours. BNP: Invalid input(s): "POCBNP" CBG: No results for input(s): "GLUCAP" in the last 168 hours. D-Dimer No results for input(s): "DDIMER" in the last 72 hours. Hgb A1c No results for input(s): "HGBA1C" in the last 72 hours. Lipid Profile No results for input(s): "CHOL", "HDL", "LDLCALC", "TRIG", "CHOLHDL", "LDLDIRECT" in the last 72 hours. Thyroid function studies No results for  input(s): "TSH", "T4TOTAL", "T3FREE", "THYROIDAB" in the last 72 hours.  Invalid input(s): "FREET3" Anemia work up No results for input(s): "VITAMINB12", "FOLATE", "FERRITIN", "TIBC", "IRON", "RETICCTPCT" in the last 72 hours. Urinalysis    Component Value Date/Time   COLORURINE YELLOW 05/23/2015 1521   APPEARANCEUR CLEAR 05/23/2015 1521   LABSPEC 1.006 05/23/2015 1521   PHURINE 8.5 (H) 05/23/2015 1521   GLUCOSEU NEGATIVE 05/23/2015 1521   HGBUR TRACE (A) 05/23/2015 1521   BILIRUBINUR NEGATIVE 05/23/2015 1521   KETONESUR NEGATIVE 05/23/2015 1521   PROTEINUR NEGATIVE 05/23/2015 1521   UROBILINOGEN 0.2 05/23/2015 1521   NITRITE NEGATIVE 05/23/2015 1521   LEUKOCYTESUR LARGE (A) 05/23/2015 1521   Sepsis Labs Recent Labs  Lab 09/02/22 0502 09/03/22 0359 09/04/22 0431 09/05/22 0529  WBC 12.2* 12.9* 11.3* 7.5   Microbiology No results found for this or any previous visit (from the past 240 hour(s)).   Time coordinating discharge: Over 30 minutes  SIGNED:   Phillips Climes, MD  [facility] 09/05/2022, 1:49 PM Pager   If 7PM-7AM, please contact night-coverage [email or web address]

## 2022-09-04 NOTE — Progress Notes (Signed)
Subjective: Sitting up on bed, appears more cheerful and comfortable than the last 2 days.  Wants to go home.  Has not had a bowel movement.  Has only been on clear liquids.  Objective: Vital signs in last 24 hours: Temp:  [97.4 F (36.3 C)-97.7 F (36.5 C)] 97.6 F (36.4 C) (01/11 0331) Pulse Rate:  [89-116] 107 (01/11 0800) Resp:  [20-33] 20 (01/11 0800) BP: (102-151)/(61-84) 151/65 (01/11 0800) SpO2:  [87 %-100 %] 99 % (01/11 0800) Weight change:  Last BM Date : 08/31/21  PE: Legally blind, on oxygen by nasal cannula GENERAL: Mild pallor, not in respiratory distress  ABDOMEN: Soft, nondistended, nontender abdomen EXTREMITIES: No deformity, no edema  Lab Results: Results for orders placed or performed during the hospital encounter of 08/30/22 (from the past 48 hour(s))  Basic metabolic panel     Status: Abnormal   Collection Time: 09/03/22  3:59 AM  Result Value Ref Range   Sodium 138 135 - 145 mmol/L   Potassium 5.4 (H) 3.5 - 5.1 mmol/L    Comment: HEMOLYSIS AT THIS LEVEL MAY AFFECT RESULT   Chloride 106 98 - 111 mmol/L   CO2 27 22 - 32 mmol/L   Glucose, Bld 131 (H) 70 - 99 mg/dL    Comment: Glucose reference range applies only to samples taken after fasting for at least 8 hours.   BUN 20 8 - 23 mg/dL   Creatinine, Ser 0.72 0.44 - 1.00 mg/dL   Calcium 8.4 (L) 8.9 - 10.3 mg/dL   GFR, Estimated >60 >60 mL/min    Comment: (NOTE) Calculated using the CKD-EPI Creatinine Equation (2021)    Anion gap 5 5 - 15    Comment: Performed at [facility] [address] [place], [place] [address]  CBC     Status: Abnormal   Collection Time: 09/03/22  3:59 AM  Result Value Ref Range   WBC 12.9 (H) 4.0 - 10.5 K/uL   RBC 3.52 (L) 3.87 - 5.11 MIL/uL   Hemoglobin 10.1 (L) 12.0 - 15.0 g/dL    Comment: REPEATED TO VERIFY POST TRANSFUSION SPECIMEN    HCT 33.5 (L) 36.0 - 46.0 %   MCV 95.2 80.0 - 100.0 fL   MCH 28.7 26.0 - 34.0 pg   MCHC 30.1 30.0 - 36.0 g/dL   RDW 18.2 (H)  11.5 - 15.5 %   Platelets 160 150 - 400 K/uL   nRBC 1.2 (H) 0.0 - 0.2 %    Comment: Performed at [facility], [place] [address], [place], [place] [address]  Magnesium     Status: None   Collection Time: 09/03/22  3:59 AM  Result Value Ref Range   Magnesium 1.9 1.7 - 2.4 mg/dL    Comment: Performed at [facility] [address] [place], [place] [address]  CBC     Status: Abnormal   Collection Time: 09/04/22  4:31 AM  Result Value Ref Range   WBC 11.3 (H) 4.0 - 10.5 K/uL   RBC 3.35 (L) 3.87 - 5.11 MIL/uL   Hemoglobin 9.8 (L) 12.0 - 15.0 g/dL   HCT 31.4 (L) 36.0 - 46.0 %   MCV 93.7 80.0 - 100.0 fL   MCH 29.3 26.0 - 34.0 pg   MCHC 31.2 30.0 - 36.0 g/dL   RDW 16.7 (H) 11.5 - 15.5 %   Platelets 166 150 - 400 K/uL   nRBC 0.4 (H) 0.0 - 0.2 %    Comment: Performed  at [facility], [place] [address] [place], [place] [address]  Basic metabolic panel     Status: None   Collection Time: 09/04/22  4:31 AM  Result Value Ref Range   Sodium 141 135 - 145 mmol/L   Potassium 3.8 3.5 - 5.1 mmol/L   Chloride 102 98 - 111 mmol/L   CO2 28 22 - 32 mmol/L   Glucose, Bld 97 70 - 99 mg/dL    Comment: Glucose reference range applies only to samples taken after fasting for at least 8 hours.   BUN 20 8 - 23 mg/dL   Creatinine, Ser 0.67 0.44 - 1.00 mg/dL   Calcium 9.0 8.9 - 10.3 mg/dL   GFR, Estimated >60 >60 mL/min    Comment: (NOTE) Calculated using the CKD-EPI Creatinine Equation (2021)    Anion gap 11 5 - 15    Comment: Performed at [facility] [address] [place], [place] [address]    Studies/Results: No results found.  Medications: I have reviewed the patient's current medications.  Assessment: Pyloric stenosis, likely related to ulcer, status post balloon dilation, 15/16.5/18 mm yesterday  Multiple ulcers: Gastric cardia, antrum, pyloric channel, pylorus-all clean-based and nonbleeding  Normal BUN Hemoglobin increased from 6.6-10.1 with 1 unit PRBC  transfusion?  Spurious, is 9.8 today without evidence of melena or hematemesis/coffee-ground emesis  Plan: Patient has been started on regular diet. Discussed with patient's hospitalist Dr. [facility]ron [facility], she will need to resume aspirin because of history of PAD, mesenteric artery thrombosis, CAD.  Hold aspirin for 2 weeks and then resume at 81 mg/day. Avoid NSAIDs. PPI twice daily for 2 months. Biopsies did not show evidence of H. Pylori. Okay to DC home from GI standpoint.  Ronnette Juniper, MD 09/04/2022, 8:43 AM

## 2022-09-04 NOTE — Progress Notes (Signed)
SATURATION QUALIFICATIONS: (This note is used to comply with regulatory documentation for home oxygen)  Patient Saturations on Room Air at Rest = 85%  Patient Saturations on Room Air while Ambulating =   Patient Saturations on 4 Liters of oxygen while Ambulating =  Please briefly explain why patient needs home oxygen: SOB at rest on room air

## 2022-09-04 NOTE — Evaluation (Signed)
Occupational Therapy Evaluation Patient Details Name: Kelly Phillips MRN: [id number] DOB: 1946/05/07 Today's Date: 09/04/2022   History of Present Illness 77 year old female admitted 08/30/22 with hematemesis. PMH GI bleed due to PUD, PAD, mesenteric artery stenosis, CAD, COPD, chronic hypoxic respiratory failure, legal blindness.   Clinical Impression   Patient admitted for the diagnosis above.  PTA, it appears she lives alone, has HHA 2x/wk primarily for community mobility and iADL assist.  The patient states she needs O2 at baseline, and generally walks with a RW.  In addition, she states she completes her own showers, and heats up meals in the microwave.  Primary deficits are lethargy and weakness.  Increased edema to bilateral arms is noted, and patient is getting dyspneic with very little activity.  Currently she is needing Min A for basic mobility and up to Mod A for ADL completion.  Patient really needs 24 hour assist at home to directly transition there.  It does not appear she has that level of assist, so SNF level rehab is recommended.  OT will follow in the acute setting to address deficits listed.        Recommendations for follow up therapy are one component of a multi-disciplinary discharge planning process, led by the attending physician.  Recommendations may be updated based on patient status, additional functional criteria and insurance authorization.   Follow Up Recommendations  Skilled nursing-short term rehab (<3 hours/day)     Assistance Recommended at Discharge Frequent or constant Supervision/Assistance  Patient can return home with the following Help with stairs or ramp for entrance;Assist for transportation;Assistance with cooking/housework;A lot of help with bathing/dressing/bathroom;A little help with walking and/or transfers    Functional Status Assessment  Patient has had a recent decline in their functional status and demonstrates the ability to make significant  improvements in function in a reasonable and predictable amount of time.  Equipment Recommendations  None recommended by OT    Recommendations for Other Services       Precautions / Restrictions Precautions Precautions: Fall Precaution Comments: on 3.5L O2 at baseline. monitor HR Restrictions Weight Bearing Restrictions: No      Mobility Bed Mobility               General bed mobility comments: up in the recliner Patient Response: Cooperative  Transfers Overall transfer level: Needs assistance Equipment used: Rolling walker (2 wheels) Transfers: Sit to/from Stand Sit to Stand: Min assist                  Balance Overall balance assessment: Needs assistance Sitting-balance support: No upper extremity supported, Feet supported Sitting balance-Leahy Scale: Fair   Postural control: Posterior lean Standing balance support: Reliant on assistive device for balance Standing balance-Leahy Scale: Poor                             ADL either performed or assessed with clinical judgement   ADL Overall ADL's : Needs assistance/impaired Eating/Feeding: Set up;Sitting   Grooming: Wash/dry hands;Wash/dry face;Set up;Sitting   Upper Body Bathing: Minimal assistance;Sitting   Lower Body Bathing: Moderate assistance;Sit to/from stand   Upper Body Dressing : Minimal assistance;Sitting   Lower Body Dressing: Moderate assistance;Sit to/from stand   Toilet Transfer: Moderate assistance;Ambulation;BSC/3in1                   Vision Baseline Vision/History: 2 Legally blind       Perception     Praxis  Pertinent Vitals/Pain Pain Assessment Pain Assessment: No/denies pain     Hand Dominance Right   Extremity/Trunk Assessment Upper Extremity Assessment Upper Extremity Assessment: Generalized weakness   Lower Extremity Assessment Lower Extremity Assessment: Defer to PT evaluation   Cervical / Trunk Assessment Cervical / Trunk  Assessment: Kyphotic   Communication Communication Communication: No difficulties   Cognition Arousal/Alertness: Lethargic Behavior During Therapy: Flat affect Overall Cognitive Status: No family/caregiver present to determine baseline cognitive functioning                       Memory: Decreased short-term memory Following Commands: Follows one step commands with increased time     Problem Solving: Slow processing, Decreased initiation, Requires verbal cues, Requires tactile cues       General Comments  HR 105-118, SpO2 92-97% on 4L supplemental O2. 3/4 dyspnea with ambulation.    Exercises     Shoulder Instructions      Home Living Family/patient expects to be discharged to:: Private residence Living Arrangements: Alone Available Help at Discharge: Other (Comment) ([place] aide 2x/wk) Type of Home: House Home Access: Stairs to enter [employer] of Steps: 3 Entrance Stairs-Rails: Can reach both Home Layout: One level     Bathroom Shower/Tub: Teacher, early years/pre: Standard Bathroom Accessibility: No   Home Equipment: Cane - single point;Rolling Walker (2 wheels);Transport chair;Wheelchair - manual;BSC/3in1   Additional Comments: Patient very lethargic, unsure of accuracy      Prior Functioning/Environment Prior Level of Function : Needs assist;Patient poor historian/Family not available             Mobility Comments: Reports she walks without a walker at home; has aides assist with IADLs 2x/week ADLs Comments: States she showers without assist, and generally uses microwave for meals.        OT Problem List: Impaired balance (sitting and/or standing);Decreased activity tolerance;Decreased strength;Increased edema      OT Treatment/Interventions: Self-care/ADL training;Therapeutic activities;Cognitive remediation/compensation;Patient/family education;Balance training;DME and/or AE instruction    OT Goals(Current goals can be  found in the care plan section) Acute Rehab OT Goals Patient Stated Goal: Return home when ready OT Goal Formulation: With patient Time For Goal Achievement: 09/18/22 Potential to Achieve Goals: Good ADL Goals Pt Will Perform Grooming: with supervision;standing Pt Will Perform Upper Body Dressing: with set-up;sitting Pt Will Perform Lower Body Dressing: with supervision;sit to/from stand Pt Will Transfer to Toilet: with supervision;ambulating;regular height toilet Pt Will Perform Toileting - Clothing Manipulation and hygiene: with modified independence;sit to/from stand  OT Frequency: Min 2X/week    Co-evaluation              AM-PAC OT "6 Clicks" Daily Activity     Outcome Measure Help from another person eating meals?: None Help from another person taking care of personal grooming?: A Little Help from another person toileting, which includes using toliet, bedpan, or urinal?: A Lot Help from another person bathing (including washing, rinsing, drying)?: A Lot Help from another person to put on and taking off regular upper body clothing?: A Little Help from another person to put on and taking off regular lower body clothing?: A Lot 6 Click Score: 16   End of Session Equipment Utilized During Treatment: Rolling walker (2 wheels);Oxygen Nurse Communication: Mobility status  Activity Tolerance: Patient limited by lethargy Patient left: in chair;with call bell/phone within reach;with chair alarm set  OT Visit Diagnosis: Unsteadiness on feet (R26.81);Muscle weakness (generalized) (M62.81)  Time: 2595-6387 OT Time Calculation (min): 20 min Charges:  OT General Charges $OT Visit: 1 Visit OT Evaluation $OT Eval Moderate Complexity: 1 Mod  09/04/2022  RP, OTR/L  Acute Rehabilitation Services  Office:  [phone]   Metta Clines 09/04/2022, 12:12 PM

## 2022-09-04 NOTE — TOC Progression Note (Signed)
Transition of Care [facility]) - Progression Note    Patient Details  Name: Kelly Phillips MRN: [id number] Date of Birth: [birth year]  Transition of Care [facility]) CM/SW [place], LCSW Phone Number: 09/04/2022, 1:51 PM  Clinical Narrative:    CSW received return call from [place] [phone]). She stated that patient's son has decided patient should go to SNF rehab while they figure out a better plan at home. They requested a [facility]. Only one in network with [facility] is [facility] so CSW asked them to review. Erin requested updates as available.    Expected Discharge Plan: Home/Self Care (has caregiver) Barriers to Discharge: No Barriers Identified  Expected Discharge Plan and Services In-house Referral: Clinical Social Work   Post Acute Care Choice: [place] arrangements for the past 2 months: Single Family Home Expected Discharge Date: 09/04/22               DME Arranged: Oxygen DME Agency: [employer] Date DME Agency Contacted: 09/04/22 Time DME Agency Contacted: [id number] Representative spoke with at DME Agency: [place] is active with oxygen they will send a tank up to get her back home. HH Arranged: PT, OT, Social Work [employer] Agency: [place] Date HH Agency Contacted: 09/04/22 Time [place]: [place] Representative spoke with at [place]: [place] ([place]) Interventions SDOH Screenings   Food Insecurity: No Food Insecurity (08/31/2022)  Housing: Low Risk  (08/31/2022)  Transportation Needs: No Transportation Needs (08/31/2022)  Utilities: Not At Risk (08/31/2022)  Depression (PHQ2-9): Low Risk  (09/22/2019)  Tobacco Use: Medium Risk (09/03/2022)    Readmission Risk Interventions     No data to display

## 2022-09-04 NOTE — Evaluation (Addendum)
Physical Therapy Evaluation Patient Details Name: Kelly Phillips MRN: [id number] DOB: 1946/07/17 Today's Date: 09/04/2022  History of Present Illness  77 year old female admitted 08/30/22 with hematemesis. PMH GI bleed due to PUD, PAD, mesenteric artery stenosis, CAD, COPD, chronic hypoxic respiratory failure, legal blindness.  Clinical Impression  Pt admitted with above diagnosis. Poor awareness, disoriented. Min assist for transfers, bed mobility, and gait. Limited hx but reports that she currently has an aide that assists 2x/week with IADLs. PTA denies using an assistive device for mobility. Pt impulsive with reduced safety awareness. HR 105-118 during assessment, SpO2 92-97% on 4L with 3/4 dyspnea after walking. Pt currently with functional limitations due to the deficits listed below (see PT Problem List). Pt will benefit from skilled PT to increase their independence and safety with mobility to allow discharge to the venue listed below.          Recommendations for follow up therapy are one component of a multi-disciplinary discharge planning process, led by the attending physician.  Recommendations may be updated based on patient status, additional functional criteria and insurance authorization.  Follow Up Recommendations Skilled nursing-short term rehab (<3 hours/day) If patient has 24/7 assist at home, HHPT will be appropriate. Can patient physically be transported by private vehicle: Yes    Assistance Recommended at Discharge Frequent or constant Supervision/Assistance  Patient can return home with the following  A little help with walking and/or transfers;A lot of help with bathing/dressing/bathroom;Assistance with cooking/housework;Direct supervision/assist for medications management;Direct supervision/assist for financial management;Assist for transportation;Help with stairs or ramp for entrance    Equipment Recommendations None recommended by PT  Recommendations for Other  Services       Functional Status Assessment Patient has had a recent decline in their functional status and demonstrates the ability to make significant improvements in function in a reasonable and predictable amount of time.     Precautions / Restrictions Precautions Precautions: Fall Precaution Comments: on 3.5L O2 at baseline. monitor HR Restrictions Weight Bearing Restrictions: No      Mobility  Bed Mobility Overal bed mobility: Needs Assistance Bed Mobility: Supine to Sit     Supine to sit: Min assist, HOB elevated     General bed mobility comments: Min assist for light trunk support to rise to EOB. Frequent VC to continue motion. Pulling throught therapist hand to assist.    Transfers Overall transfer level: Needs assistance Equipment used: Rolling walker (2 wheels) Transfers: Sit to/from Stand Sit to Stand: Min assist           General transfer comment: Min assist for boost to stand, posterior lean. Practiced several times from bed. Cues for technique. Pt sits impulsively at times.    Ambulation/Gait Ambulation/Gait assistance: Min assist Gait Distance (Feet): 15 Feet Assistive device: Rolling walker (2 wheels) Gait Pattern/deviations: Step-through pattern, Decreased stride length, Shuffle, Trunk flexed Gait velocity: dec Gait velocity interpretation: <1.31 ft/sec, indicative of household ambulator   General Gait Details: Educated on appropriate use of RW. Managed lines/leads, CGA for safety. Pt requires frequent cues for direction in room, impulsive to start walking in opposite direction at times. No buckling noted.  Stairs            Wheelchair Mobility    Modified Rankin (Stroke Patients Only)       Balance Overall balance assessment: Needs assistance Sitting-balance support: No upper extremity supported, Feet supported Sitting balance-Leahy Scale: Fair     Standing balance support: Single extremity supported Standing balance-Leahy  Scale: Poor  Pertinent Vitals/Pain Pain Assessment Pain Assessment: No/denies pain    Home Living Family/patient expects to be discharged to:: Private residence Living Arrangements: Alone Available Help at Discharge: Other (Comment) ([place] aide) Type of Home: House Home Access: Stairs to enter Entrance Stairs-Rails: Can reach both Entrance Stairs-Number of Steps: 3   Home Layout: One level Home Equipment: [place] - single [place] (2 wheels);Transport chair;Wheelchair - manual;BSC/3in1 Additional Comments: Less than great historian, some info taken from [facility]. Reports HHA 3hr Wed and Friday    Prior Function Prior Level of Function : Needs assist;Patient poor historian/Family not available             Mobility Comments: Reports she walks without a walker at home; has aides assist with IADLs 2x/week       Hand Dominance        Extremity/Trunk Assessment   Upper Extremity Assessment Upper Extremity Assessment: Defer to OT evaluation    Lower Extremity Assessment Lower Extremity Assessment: Generalized weakness       Communication   Communication: No difficulties  Cognition Arousal/Alertness: Awake/alert Behavior During Therapy: Impulsive Overall Cognitive Status: No family/caregiver present to determine baseline cognitive functioning Area of Impairment: Orientation, Memory, Following commands, Safety/judgement, Problem solving                 Orientation Level: Disoriented to, Person, Place, Situation   Memory: Decreased short-term memory Following Commands: Follows one step commands inconsistently, Follows one step commands with increased time Safety/Judgement: Decreased awareness of safety, Decreased awareness of deficits   Problem Solving: Slow processing, Decreased initiation, Difficulty sequencing, Requires verbal cues, Requires tactile cues General Comments: Showing some better recall towards end of  session - she was able to state that she was in the hospital, and was taken here because she vomitted blood. Did not recall any of this info at beginning of session.        General Comments General comments (skin integrity, edema, etc.): HR 105-118, SpO2 92-97% on 4L supplemental O2. 3/4 dyspnea with ambulation.    Exercises     Assessment/Plan    PT Assessment Patient needs continued PT services  PT Problem List Decreased strength;Decreased activity tolerance;Decreased balance;Decreased mobility;Decreased cognition;Decreased knowledge of use of DME;Decreased safety awareness;Cardiopulmonary status limiting activity       PT Treatment Interventions DME instruction;Gait training;Functional mobility training;Therapeutic activities;Therapeutic exercise;Balance training;Neuromuscular re-education;Cognitive remediation;Patient/family education    PT Goals (Current goals can be found in the Care Plan section)  Acute Rehab PT Goals Patient Stated Goal: Maybe get some rehab before going home PT Goal Formulation: Patient unable to participate in goal setting Time For Goal Achievement: 09/18/22 Potential to Achieve Goals: Good    Frequency Min 3X/week     Co-evaluation               AM-PAC PT "6 Clicks" Mobility  Outcome Measure Help needed turning from your back to your side while in a flat bed without using bedrails?: A Little Help needed moving from lying on your back to sitting on the side of a flat bed without using bedrails?: A Little Help needed moving to and from a bed to a chair (including a wheelchair)?: A Little Help needed standing up from a chair using your arms (e.g., wheelchair or bedside chair)?: A Little Help needed to walk in hospital room?: A Little Help needed climbing 3-5 steps with a railing? : A Lot 6 Click Score: 17    End of Session Equipment Utilized During Treatment: Gait  belt Activity Tolerance: Patient tolerated treatment well Patient left: in  chair;with call bell/phone within reach;with chair alarm set Nurse Communication: Mobility status PT Visit Diagnosis: Unsteadiness on feet (R26.81);Other abnormalities of gait and mobility (R26.89);Muscle weakness (generalized) (M62.81);Difficulty in walking, not elsewhere classified (R26.2)    Time: 7195-9747 PT Time Calculation (min) (ACUTE ONLY): 28 min   Charges:   PT Evaluation $PT Eval Low Complexity: 1 Low PT Treatments $Therapeutic Activity: 8-22 mins        Candie Mile, PT, DPT Physical Therapist Acute Rehabilitation Services [place]   Ellouise Newer 09/04/2022, 11:53 AM

## 2022-09-04 NOTE — TOC Progression Note (Signed)
Transition of Care [facility]) - Progression Note    Patient Details  Name: Kelly Phillips MRN: [id number] Date of Birth: [birth year]  Transition of Care [facility]) CM/SW [place], LCSW Phone Number: 09/04/2022, 1:11 PM  Clinical Narrative:    CSW and RNCM met with patient at bedside and had patient's previous caregiver on the phone Apolonio Schneiders [phone]). She stated that she was not aware that patient was discharging so they need time to plan as she does normally have caregivers all day. CSW explained PT recommendation of SNF for rehab while they plan but patient stated she would like to go home not SNF. Apolonio Schneiders provided contact info for current caregiver Junie Panning [phone]). CSW spoke with Junie Panning. She stated she will call patient's son to see if he wants to increase their pay for them to be with patient at home and call CSW back. She requested for patient to discharge in the morning so she has time to get things together and come pick her up.    Expected Discharge Plan: Home/Self Care (has caregiver)    Expected Discharge Plan and Services In-house Referral: Clinical Social Work   Post Acute Care Choice: [place] arrangements for the past 2 months: [place] Expected Discharge Date: 09/04/22               DME Arranged: Oxygen DME Agency: [employer] Date DME Agency Contacted: 09/04/22 Time DME Agency Contacted: [phone] Representative spoke with at DME Agency: Lawrence Marseilles is active with oxygen they will send a tank up to get her back home.             Social Determinants of Health (SDOH) Interventions SDOH Screenings   Food Insecurity: No Food Insecurity (08/31/2022)  Housing: Low Risk  (08/31/2022)  Transportation Needs: No Transportation Needs (08/31/2022)  Utilities: Not At Risk (08/31/2022)  Depression (PHQ2-9): Low Risk  (09/22/2019)  Tobacco Use: Medium Risk (09/03/2022)    Readmission Risk Interventions     No data to display

## 2022-09-04 NOTE — TOC Progression Note (Addendum)
Transition of Care [facility]) - Progression Note    Patient Details  Name: Kelly Phillips MRN: [id number] Date of Birth: [birth year]  Transition of Care [facility]) CM/SW [place], RN Phone Number: 09/04/2022, 12:41 PM  Clinical Narrative:      Spoke to patient, OT in the room, received a message from PT that they are going to recommend skilled nursing ( SNF) as the patient only has caregivers come twice a week ( paid out of pocket). Brought In home health list ( also on shadow chart). Paitent just wants a agency that works best with her insurance.   Called son Evangeline Gula, he conformed that the caregivers are only a couple of times a week.  PT just sent out a message as I was speaking to the son regarding their recommendation of SNF, Made MD aware. Discussed SNF with son. CSW will discuss with patient regarding this. Darren states her biggest fear is that she goes into a  facility and she never can get back home.  Messaged provider and CSW regarding this. Provider also spoke to son on the phone to update him 1300 caregiver Apolonio Schneiders called and wanted a update , she states she has been calling the room and no answer. Patient understands PT recommendation, deciding to go home. She is alert and oriented. She spoke to [place] and gave [place] permission to call her other caregiver Junie Panning. Patient is fairly sleepy she stated it has been "a hard week" [employer] called to bring oxygen tank for discharge.   TOC will continue to monitor, can be DC today  Expected Discharge Plan: Home/Self Care (has caregiver)    Expected Discharge Plan and Services         Expected Discharge Date: 09/04/22               DME Arranged: Oxygen DME Agency: [employer] Date DME Agency Contacted: 09/04/22 Time DME Agency Contacted: 0940 Representative spoke with at DME Agency: Lawrence Marseilles is active with oxygen they will send a tank up to get her back home.             Social Determinants of Health (SDOH)  Interventions SDOH Screenings   Food Insecurity: No Food Insecurity (08/31/2022)  Housing: Low Risk  (08/31/2022)  Transportation Needs: No Transportation Needs (08/31/2022)  Utilities: Not At Risk (08/31/2022)  Depression (PHQ2-9): Low Risk  (09/22/2019)  Tobacco Use: Medium Risk (09/03/2022)    Readmission Risk Interventions     No data to display

## 2022-09-05 DIAGNOSIS — I739 Peripheral vascular disease, unspecified: Secondary | ICD-10-CM | POA: Diagnosis not present

## 2022-09-05 DIAGNOSIS — Z951 Presence of aortocoronary bypass graft: Secondary | ICD-10-CM | POA: Diagnosis not present

## 2022-09-05 DIAGNOSIS — I6523 Occlusion and stenosis of bilateral carotid arteries: Secondary | ICD-10-CM | POA: Diagnosis not present

## 2022-09-05 DIAGNOSIS — K279 Peptic ulcer, site unspecified, unspecified as acute or chronic, without hemorrhage or perforation: Secondary | ICD-10-CM | POA: Diagnosis not present

## 2022-09-05 DIAGNOSIS — M6289 Other specified disorders of muscle: Secondary | ICD-10-CM | POA: Diagnosis not present

## 2022-09-05 DIAGNOSIS — J449 Chronic obstructive pulmonary disease, unspecified: Secondary | ICD-10-CM | POA: Diagnosis not present

## 2022-09-05 DIAGNOSIS — I10 Essential (primary) hypertension: Secondary | ICD-10-CM | POA: Diagnosis not present

## 2022-09-05 DIAGNOSIS — D72829 Elevated white blood cell count, unspecified: Secondary | ICD-10-CM | POA: Diagnosis not present

## 2022-09-05 DIAGNOSIS — F419 Anxiety disorder, unspecified: Secondary | ICD-10-CM | POA: Diagnosis not present

## 2022-09-05 DIAGNOSIS — K5900 Constipation, unspecified: Secondary | ICD-10-CM | POA: Diagnosis not present

## 2022-09-05 DIAGNOSIS — J9611 Chronic respiratory failure with hypoxia: Secondary | ICD-10-CM | POA: Diagnosis not present

## 2022-09-05 DIAGNOSIS — D649 Anemia, unspecified: Secondary | ICD-10-CM | POA: Diagnosis not present

## 2022-09-05 DIAGNOSIS — R000 Tachycardia, unspecified: Secondary | ICD-10-CM | POA: Diagnosis not present

## 2022-09-05 DIAGNOSIS — M1990 Unspecified osteoarthritis, unspecified site: Secondary | ICD-10-CM | POA: Diagnosis not present

## 2022-09-05 DIAGNOSIS — R531 Weakness: Secondary | ICD-10-CM | POA: Diagnosis not present

## 2022-09-05 DIAGNOSIS — M62838 Other muscle spasm: Secondary | ICD-10-CM | POA: Diagnosis not present

## 2022-09-05 DIAGNOSIS — D6949 Other primary thrombocytopenia: Secondary | ICD-10-CM | POA: Diagnosis not present

## 2022-09-05 DIAGNOSIS — K219 Gastro-esophageal reflux disease without esophagitis: Secondary | ICD-10-CM | POA: Diagnosis not present

## 2022-09-05 DIAGNOSIS — D62 Acute posthemorrhagic anemia: Secondary | ICD-10-CM | POA: Diagnosis not present

## 2022-09-05 DIAGNOSIS — K551 Chronic vascular disorders of intestine: Secondary | ICD-10-CM | POA: Diagnosis not present

## 2022-09-05 DIAGNOSIS — Z743 Need for continuous supervision: Secondary | ICD-10-CM | POA: Diagnosis not present

## 2022-09-05 DIAGNOSIS — K922 Gastrointestinal hemorrhage, unspecified: Secondary | ICD-10-CM | POA: Diagnosis not present

## 2022-09-05 DIAGNOSIS — E119 Type 2 diabetes mellitus without complications: Secondary | ICD-10-CM | POA: Diagnosis not present

## 2022-09-05 DIAGNOSIS — H3530 Unspecified macular degeneration: Secondary | ICD-10-CM | POA: Diagnosis not present

## 2022-09-05 DIAGNOSIS — R69 Illness, unspecified: Secondary | ICD-10-CM | POA: Diagnosis not present

## 2022-09-05 DIAGNOSIS — G4700 Insomnia, unspecified: Secondary | ICD-10-CM | POA: Diagnosis not present

## 2022-09-05 DIAGNOSIS — Z7401 Bed confinement status: Secondary | ICD-10-CM | POA: Diagnosis not present

## 2022-09-05 DIAGNOSIS — I2510 Atherosclerotic heart disease of native coronary artery without angina pectoris: Secondary | ICD-10-CM | POA: Diagnosis not present

## 2022-09-05 DIAGNOSIS — M6281 Muscle weakness (generalized): Secondary | ICD-10-CM | POA: Diagnosis not present

## 2022-09-05 DIAGNOSIS — R1319 Other dysphagia: Secondary | ICD-10-CM | POA: Diagnosis not present

## 2022-09-05 LAB — BASIC METABOLIC PANEL
Anion gap: 8 (ref 5–15)
BUN: 16 mg/dL (ref 8–23)
CO2: 31 mmol/L (ref 22–32)
Calcium: 8.6 mg/dL — ABNORMAL LOW (ref 8.9–10.3)
Chloride: 101 mmol/L (ref 98–111)
Creatinine, Ser: 0.57 mg/dL (ref 0.44–1.00)
GFR, Estimated: >60 mL/min (ref >60)
Glucose, Bld: 94 mg/dL (ref 70–99)
Potassium: 3.2 mmol/L — ABNORMAL LOW (ref 3.5–5.1)
Sodium: 140 mmol/L (ref 135–145)

## 2022-09-05 LAB — CBC
HCT: 27.6 % — ABNORMAL LOW (ref 36.0–46.0)
Hemoglobin: 8.6 g/dL — ABNORMAL LOW (ref 12.0–15.0)
MCH: 29.4 pg (ref 26.0–34.0)
MCHC: 31.2 g/dL (ref 30.0–36.0)
MCV: 94.2 fL (ref 80.0–100.0)
Platelets: 144 10*3/uL — ABNORMAL LOW (ref 150–400)
RBC: 2.93 MIL/uL — ABNORMAL LOW (ref 3.87–5.11)
RDW: 15.9 % — ABNORMAL HIGH (ref 11.5–15.5)
WBC: 7.5 10*3/uL (ref 4.0–10.5)
nRBC: 0 % (ref 0.0–0.2)

## 2022-09-05 MED ORDER — ALPRAZOLAM 0.5 MG PO TABS: 0.2500 mg | ORAL_TABLET | Freq: Two times a day (BID) | ORAL | 0 refills | Status: DC | PRN

## 2022-09-05 MED ORDER — ALPRAZOLAM 0.5 MG PO TABS: 0.2500 mg | ORAL_TABLET | Freq: Two times a day (BID) | ORAL | 0 refills | Status: AC | PRN

## 2022-09-05 MED ORDER — MIDODRINE HCL 5 MG PO TABS
5.0000 mg | ORAL_TABLET | Freq: Three times a day (TID) | ORAL | Status: DC
  Administered 2022-09-05 (×3): 5 mg via ORAL
  Filled 2022-09-05 (×3): qty 1

## 2022-09-05 MED ORDER — MIDODRINE HCL 5 MG PO TABS: 5.0000 mg | ORAL_TABLET | Freq: Three times a day (TID) | ORAL | Status: AC

## 2022-09-05 MED ORDER — POTASSIUM CHLORIDE CRYS ER 10 MEQ PO TBCR
30.0000 meq | EXTENDED_RELEASE_TABLET | Freq: Four times a day (QID) | ORAL | Status: AC
  Administered 2022-09-05 (×2): 30 meq via ORAL
  Filled 2022-09-05 (×2): qty 3

## 2022-09-05 MED ORDER — METOPROLOL TARTRATE 12.5 MG HALF TABLET: 12.5000 mg | ORAL_TABLET | Freq: Two times a day (BID) | ORAL | Status: DC

## 2022-09-05 MED ORDER — POTASSIUM CHLORIDE CRYS ER 20 MEQ PO TBCR: 40.0000 meq | EXTENDED_RELEASE_TABLET | Freq: Four times a day (QID) | ORAL | Status: DC

## 2022-09-05 NOTE — Plan of Care (Signed)

## 2022-09-05 NOTE — Progress Notes (Signed)
Called [facility] at [phone] and gave report to [place].

## 2022-09-05 NOTE — Progress Notes (Signed)
Called for patient tray this morning.  At approximately 0815 tray was in room and I went in and asked patient if she wanted to eat and she advised not now.  I later went in at approximately 0900 and patient stated she was not ready to eat.

## 2022-09-05 NOTE — Progress Notes (Signed)
PROGRESS NOTE  Kelly Phillips XNA:[id number] DOB: 04-10-1946 DOA: 08/30/2022 PCP: Emmaline Kluver, MD   LOS: 6 days   Brief Narrative / Interim history:  77 year old female with prior GI bleed due to PUD, PAD, mesenteric artery stenosis, CAD, COPD, chronic hypoxic respiratory failure, legal blindness comes into the hospital with hematemesis.  This was associated with epigastric pain.  Caregiver witnessed some blood in the vomit, patient is blind but could feel the taste of blood.  GI consulted and she was admitted to the hospital  Subjective / 24h Interval events:  No significant events overnight, she denies any complaints today   Assesement and Plan: Principal Problem:   Acute upper GI bleeding Active Problems:   Chronic respiratory failure with hypoxia (HCC)   GERD without esophagitis   Peripheral vascular disease, unspecified (HCC)   Status post aortobifemoral bypass surgery   Benign essential hypertension   Bilateral carotid artery stenosis   Superior mesenteric artery stenosis (HCC)   COPD (chronic obstructive pulmonary disease) with emphysema (HCC)    Upper GI bleed  - patient with history of PUD, most recent endoscopy in 2021.  She is drinking EtOH daily, although small amount, but using NSAIDs every day.  GI consulted, underwent an EGD on 1/7 which showed large amount of probably some food and old blood in the stomach, nonbleeding gastric ulcer with no stigmata of bleeding, normal duodenum.  -Hemoglobin dropped again  to 6.6, requiring unit PRBC with great response at 10.1. she went for repeat endoscopy 11/10, which was significant for pyloric stenosis, likely related to ulcers, status post balloon dilation, and evidence of multiple ulcers,Gastric cardia, antrum, pyloric channel, pylorus-all clean-based and nonbleeding  . -Discussed with GI, she is okay to resume her regular diet, continue with Protonix 40 mg twice daily, patient  was instructed to hold NSAIDs and will  hold aspirin for 2 weeks, to be resumed after that if her hemoglobin remained stable. -She was transfused total of 2 units PRBC during hospital stay  Acute blood loss anemia  - she required 2 units PRBC, hemoglobin is down to 8.6 today, monitor CBC closely.  PAD, CAD, mesenteric artery stenosis - status post aortofemoral bypass.  Hold home aspirin for now, resume in 2 weeks  Macular degeneration, legal blindness - noted  Somnolence  patient has been somnolent with poor oral intake over last 24 hours, restarted gabapentin at bedtime from 600-300, and will hold it for daytime.  Hypertension -Home medications on hold, she was started on metoprolol due to sinus tachycardia, blood pressure is soft this morning will add low-dose midodrine, will hold metoprolol today as well.  COPD with chronic hypoxic respiratory failure, 3.5 L at home- respiratory status is at baseline but subjectively she is feeling more short of breath, possibly in the setting of anemia.  Chest x-ray yesterday unremarkable.  Continue supplemental oxygen  Scheduled Meds:  budesonide (PULMICORT) nebulizer solution  0.25 mg Nebulization BID   gabapentin  300 mg Oral QHS   midodrine  5 mg Oral TID WC   pantoprazole (PROTONIX) IV  40 mg Intravenous Q12H   potassium chloride  30 mEq Oral Q6H   sodium chloride flush  3 mL Intravenous Q12H   Continuous Infusions:   PRN Meds:.acetaminophen **OR** acetaminophen, ALPRAZolam, ipratropium-albuterol, ondansetron (ZOFRAN) IV, mouth rinse, polyethylene glycol  Current Outpatient Medications  Medication Instructions   ALPRAZolam (XANAX) 0.25-0.5 mg, Oral, See admin instructions, Take 1/2 tablet (0.25 mg) by mouth every morning and 1 tablet (0.5 mg)  at night   amLODipine-benazepril (LOTREL) 5-10 MG capsule 1 capsule, Oral, Daily at bedtime   aspirin EC 81 mg, Oral, Daily at bedtime, Swallow whole.   baclofen (LIORESAL) 10 mg, Oral, 2 times daily PRN   docusate sodium (COLACE) 100-200  mg, Oral, See admin instructions, Take one capsule (100 mg) by mouth every other night at bedtime, take two capsules (200 mg) on alternate nights   fluticasone (FLOVENT HFA) 110 MCG/ACT inhaler 2 puffs, Inhalation, Daily   furosemide (LASIX) 20 mg, Oral, Daily PRN   gabapentin (NEURONTIN) 300 mg, Oral, Daily at bedtime, Take one capsule (300 mg) by mouth after lunch and two capsules (600 mg) at night   ipratropium-albuterol (DUONEB) 0.5-2.5 (3) MG/3ML SOLN 3 mLs, Nebulization, Every 6 hours PRN   meloxicam (MOBIC) 15 mg, Oral, Daily PRN   metoprolol tartrate (LOPRESSOR) 25 mg, Oral, 2 times daily   OXYGEN 3 L, Inhalation, Continuous   pantoprazole (PROTONIX) 40 mg, Oral, 2 times daily   polyethylene glycol (MIRALAX / GLYCOLAX) 17 g, Oral, Daily PRN    Diet Orders (From admission, onward)     Start     Ordered   09/04/22 0000  Diet - low sodium heart healthy        09/04/22 1144   09/03/22 1944  Diet regular Room service appropriate? Yes; Fluid consistency: Thin  Diet effective now       Question Answer Comment  Room service appropriate? Yes   Fluid consistency: Thin      09/03/22 1943            DVT prophylaxis: Place and maintain sequential compression device Start: 09/05/22 0818 Place and maintain sequential compression device Start: 09/05/22 0649 SCDs Start: 08/30/22 1621   Lab Results  Component Value Date   PLT 144 (L) 09/05/2022      Code Status: DNR  Family Communication: no family at bedside, discussed with son by phone 1/11  Status is: Inpatient Remains inpatient appropriate because: GI bleed   Level of care: Progressive  Consultants:  GI  Objective: Vitals:   09/05/22 0820 09/05/22 0825 09/05/22 0830 09/05/22 0835  BP:    91/79  Pulse: 81 81 84 88  Resp: '17 19 17 '$ (!) 23  Temp:      TempSrc:      SpO2: 100% 100% 100% 100%  Weight:      Height:        Intake/Output Summary (Last 24 hours) at 09/05/2022 1142 Last data filed at 09/05/2022  0320 Gross per 24 hour  Intake --  Output 1000 ml  Net -1000 ml   Wt Readings from Last 3 Encounters:  08/30/22 54.4 kg  02/24/20 56.7 kg  10/28/19 58 kg    Examination:  Awake Alert, Oriented X 3, frail, legally blind Symmetrical Chest wall movement, Good air movement bilaterally, CTAB RRR,No Gallops,Rubs or new Murmurs, No Parasternal Heave +ve B.Sounds, Abd Soft, No tenderness, No rebound - guarding or rigidity. No Cyanosis, Clubbing or edema, No new Rash or bruise       Data Reviewed: I have independently reviewed following labs and imaging studies  CBC Recent Labs  Lab 08/30/22 1350 08/30/22 1700 08/31/22 0102 09/01/22 0620 09/02/22 0502 09/03/22 0359 09/04/22 0431 09/05/22 0529  WBC 11.1* 10.5   < > 12.5* 12.2* 12.9* 11.3* 7.5  HGB 9.5* 7.5*   < > 7.6* 6.6* 10.1* 9.8* 8.6*  HCT 31.8* 23.5*   < > 24.9* 22.2* 33.5* 31.4* 27.6*  PLT  149* 136*   < > 112* 123* 160 166 144*  MCV 99.7 97.1   < > 97.3 100.5* 95.2 93.7 94.2  MCH 29.8 31.0   < > 29.7 29.9 28.7 29.3 29.4  MCHC 29.9* 31.9   < > 30.5 29.7* 30.1 31.2 31.2  RDW 13.4 13.4   < > 15.6* 14.6 18.2* 16.7* 15.9*  LYMPHSABS 0.4* 0.5*  --   --   --   --   --   --   MONOABS 0.4 0.8  --   --   --   --   --   --   EOSABS 0.0 0.0  --   --   --   --   --   --   BASOSABS 0.0 0.0  --   --   --   --   --   --    < > = values in this interval not displayed.    Recent Labs  Lab 08/30/22 1300 08/30/22 1400 08/30/22 1400 08/31/22 0102 09/01/22 0324 09/02/22 0502 09/03/22 0359 09/04/22 0431 09/05/22 0529  NA  --  139   < > 143 139 138 138 141 140  K  --  5.0   < > 4.8 4.8 4.7 5.4* 3.8 3.2*  CL  --  95*   < > 105 106 103 106 102 101  CO2  --  32   < > '31 24 30 27 28 31  '$ GLUCOSE  --  113*   < > 124* 94 124* 131* 97 94  BUN  --  57*   < > 68* 40* '21 20 20 16  '$ CREATININE  --  0.82   < > 0.75 0.69 0.82 0.72 0.67 0.57  CALCIUM  --  9.4   < > 8.8* 8.5* 8.2* 8.4* 9.0 8.6*  AST  --  24  --  19  --   --   --   --   --    ALT  --  19  --  15  --   --   --   --   --   ALKPHOS  --  87  --  64  --   --   --   --   --   BILITOT  --  0.7  --  0.7  --   --   --   --   --   ALBUMIN  --  3.6  --  2.9*  --   --   --   --   --   MG  --   --   --   --   --  1.8 1.9  --   --   INR 1.0  --   --   --   --   --   --   --   --    < > = values in this interval not displayed.    ------------------------------------------------------------------------------------------------------------------ No results for input(s): "CHOL", "HDL", "LDLCALC", "TRIG", "CHOLHDL", "LDLDIRECT" in the last 72 hours.  Lab Results  Component Value Date   HGBA1C 6.1 (H) 05/17/2015   ------------------------------------------------------------------------------------------------------------------ No results for input(s): "TSH", "T4TOTAL", "T3FREE", "THYROIDAB" in the last 72 hours.  Invalid input(s): "FREET3"  Cardiac Enzymes No results for input(s): "CKMB", "TROPONINI", "MYOGLOBIN" in the last 168 hours.  Invalid input(s): "CK" ------------------------------------------------------------------------------------------------------------------    Component Value Date/Time   BNP 440.9 (H) 05/14/2015 0553    CBG: No results for input(s): "GLUCAP" in the  last 168 hours.  No results found for this or any previous visit (from the past 240 hour(s)).   Radiology Studies: No results found.   Phillips Climes , MD [facility]  Between 7 am - 7 pm I am available, please contact me via Amion (for emergencies) or Securechat (non urgent messages)  Between 7 pm - 7 am I am not available, please contact night coverage MD/APP via Amion

## 2022-09-05 NOTE — TOC Initial Note (Addendum)
Transition of Care [facility]) - Initial/Assessment Note    Patient Details  Name: Kelly Phillips MRN: [id number] Date of Birth: 1946/05/24  Transition of Care [facility]) CM/SW Contact:    Benard Halsted, LCSW Phone Number: 09/05/2022, 2:40 PM  Clinical Narrative:                 Biochemist, clinical received for [employer] today. CSW updated patient's son and Junie Panning. Will require PTAR.  CSW updated patient. She was frustrated and does not want to go but stated to hurry and get her to the facility.   Expected Discharge Plan: Skilled Nursing Facility Barriers to Discharge: Insurance Authorization   Patient Goals and CMS Choice Patient states their goals for this hospitalization and ongoing recovery are:: Return home CMS Medicare.gov Compare Post Acute Care list provided to:: Patient Choice offered to / list presented to : Patient [place] ownership interest in [facility].provided to:: Patient    Expected Discharge Plan and Services In-house Referral: Clinical Social Work   Post Acute Care Choice: [place] arrangements for the past 2 months: [place] Expected Discharge Date: 09/05/22               DME Arranged: Oxygen DME Agency: [employer] Date DME Agency Contacted: 09/04/22 Time DME Agency Contacted: [phone] Representative spoke with at DME Agency: Lawrence Marseilles is active with oxygen they will send a tank up to get her back home. HH Arranged: PT, OT, Social Work [employer] Agency: Nadine Date [place]: 09/04/22 Time [place]: 51 Representative spoke with at [place]: Sharmon Revere  Prior Living Arrangements/Services Living arrangements for the past 2 months: [place] with:: Self Patient language and need for interpreter reviewed:: Yes Do you feel safe going back to the place where you live?: Yes      Need for Family Participation in Patient Care: Yes (Comment) Care giver support system in  place?: Yes (comment) Current home services: Homehealth aide, DME (O2) Criminal Activity/Legal Involvement Pertinent to Current Situation/Hospitalization: No - Comment as needed  Activities of Daily Living Home Assistive Devices/Equipment: Walker (specify type) ADL Screening (condition at time of admission) Patient's cognitive ability adequate to safely complete daily activities?: Yes Is the patient deaf or have difficulty hearing?: No Does the patient have difficulty seeing, even when wearing glasses/contacts?: Yes Does the patient have difficulty concentrating, remembering, or making decisions?: No Patient able to express need for assistance with ADLs?: Yes Does the patient have difficulty dressing or bathing?: No Independently performs ADLs?: No Communication: Independent, Dependent Is this a change from baseline?: Pre-admission baseline Dressing (OT): Independent Grooming: Independent Feeding: Independent Bathing: Independent Toileting: Independent In/Out Bed: Independent Walks in Home: Independent Does the patient have difficulty walking or climbing stairs?: No Weakness of Legs: Both Weakness of Arms/Hands: None  Permission Sought/Granted Permission sought to share information with : Facility Sport and exercise psychologist, Family Supports Permission granted to share information with : Yes, Verbal Permission Granted  Share Information with NAME: Darren/Erin  Permission granted to share info w AGENCY: SNFs  Permission granted to share info w Relationship: Son/Caregiver     Emotional Assessment Appearance:: Appears stated age Attitude/Demeanor/Rapport: Engaged Affect (typically observed): Quiet Orientation: : Oriented to Self, Oriented to Place, Oriented to  Time, Oriented to Situation Alcohol / Substance Use: Not Applicable Psych Involvement: No (comment)  Admission diagnosis:  Acute upper GI bleeding [K92.2] Hematemesis with nausea [K92.0] Patient Active Problem List    Diagnosis  Date Noted   Acute upper GI bleeding 08/30/2022   Upper GI bleed 08/27/2020   Acute lower GI bleeding 08/26/2020   Chronic respiratory failure with hypoxia ([place]) 08/26/2020   GERD without esophagitis 08/26/2020   Heartburn 10/28/2019   Shortness of breath 10/28/2019   Status post aortobifemoral bypass surgery 09/09/2019   Tachycardia 09/09/2019   Acute blood loss anemia    Thrombocytopenia (HCC)    Exercise hypoxemia 07/10/2016   Benign essential hypertension 01/17/2016   Bilateral carotid artery stenosis 01/17/2016   Former smoker 01/17/2016   Superior mesenteric artery stenosis ([place]) 01/17/2016   Multiple lung nodules on CT 12/28/2015   Atherosclerosis of native artery of extremity with intermittent claudication ([place]) 11/22/2015   Chronic mesenteric insufficiency ([place]) 11/22/2015   Edema of extremities 11/22/2015   Ventilator dependence, reintubated 9/7 for return to OR for thrombectomy, Massive volume overload, may need trach 05/11/2015   History of smoking 05/11/2015   Peripheral vascular disease, unspecified ([place]) 05/11/2015   Arthritis 05/11/2015   Macular degeneration 05/11/2015   Aortic occlusion ([place]) 05/02/2015   COPD (chronic obstructive pulmonary disease) with emphysema ([place]) 05/02/2015   Aortic thromboembolism ([place]) 04/04/2015   PCP:  Street, Sharon [place], MD Pharmacy:   [id number]: [phone]: [phone] [facility] [address]Phone: [phone]: [phone]    Social Determinants of Health (SDOH) Social History: SDOH Screenings   Food Insecurity: No Food Insecurity (08/31/2022)  Housing: Low Risk  (08/31/2022)  Transportation Needs: No Transportation Needs (08/31/2022)  Utilities: Not At Risk (08/31/2022)  Depression (PHQ2-9): Low Risk  (09/22/2019)  Tobacco Use: Medium Risk  (09/03/2022)   SDOH Interventions:     Readmission Risk Interventions     No data to display

## 2022-09-05 NOTE — TOC Transition Note (Signed)
Transition of Care [facility]) - CM/SW Discharge Note   Patient Details  Name: Kelly Phillips MRN: [id number] Date of Birth: 06-06-1946  Transition of Care [facility]) CM/SW Contact:  Benard Halsted, LCSW Phone Number: 09/05/2022, 4:42 PM   Clinical Narrative:    Patient will DC to: [facility] Anticipated DC date: 09/05/22 Family notified: Son and caregiver Architectural technologist by: Corey Harold   Per MD patient ready for DC to [place]. RN to call report prior to discharge [phone] room [phone]). RN, patient, patient's family, and facility notified of DC. Discharge Summary and FL2 sent to facility. DC packet on chart including signed DNR and signed pad script from MD for Xanax. Yellow copy placed in hard chart. Ambulance transport requested for patient.   CSW will sign off for now as social work intervention is no longer needed. Please consult [place] again if new needs arise.     Final next level of care: Skilled Nursing Facility Barriers to Discharge: Barriers Resolved   Patient Goals and CMS Choice CMS Medicare.gov Compare Post Acute Care list provided to:: Patient Choice offered to / list presented to : Patient  Discharge Placement     Existing PASRR number confirmed : 09/05/22          Patient chooses bed at:  [facility]) Patient to be transferred to facility by: [place] Name of family member notified: Son Patient and family notified of of transfer: 09/05/22  Discharge Plan and Services Additional resources added to the After Visit Summary for   In-house Referral: Clinical Social Work   Post Acute Care Choice: Home Health          DME Arranged: Oxygen DME Agency: [employer] Date DME Agency Contacted: 09/04/22 Time DME Agency Contacted: [phone] Representative spoke with at DME Agency: Lawrence Marseilles is active with oxygen they will send a tank up to get her back home. HH Arranged: PT, OT, Social Work [employer] Agency: [place] Date HH Agency Contacted: 09/04/22 Time [place]: [place] Representative spoke with at [place]: [place] ([place]) Interventions SDOH Screenings   Food Insecurity: No Food Insecurity (08/31/2022)  Housing: Low Risk  (08/31/2022)  Transportation Needs: No Transportation Needs (08/31/2022)  Utilities: Not At Risk (08/31/2022)  Depression (PHQ2-9): Low Risk  (09/22/2019)  Tobacco Use: Medium Risk (09/03/2022)     Readmission Risk Interventions     No data to display

## 2022-09-05 NOTE — TOC Progression Note (Signed)
Transition of Care [facility]) - Progression Note    Patient Details  Name: Kelly Phillips MRN: [id number] Date of Birth: [birth year]  Transition of Care [facility]) CM/SW [place], LCSW Phone Number: 09/05/2022, 9:34 AM  Clinical Narrative:    CSW received voicemail from [place] stating patient's son had received a call from a physical therapist stating patient's son had discharged home and the family was confused and upset. CSW returned Erin's call and made her aware that patient is still in the hospital and awaiting insurance authorization for SNF as stated yesterday. She thanked CSW and stated they called hospital last night and confirmed patient was still here. Per chart review in RNCM's note, it must have been the home health liaison that called patient's son. CSW made [facility] aware that patient is going to SNF.    Expected Discharge Plan: [place] Barriers to Discharge: Insurance Authorization  Expected Discharge Plan and Services In-house Referral: Clinical Social Work   Post Acute Care Choice: [place] arrangements for the past 2 months: [place] Expected Discharge Date: 09/04/22               DME Arranged: Oxygen DME Agency: [employer] Date DME Agency Contacted: 09/04/22 Time DME Agency Contacted: [id number] Representative spoke with at DME Agency: [place] is active with oxygen they will send a tank up to get her back home. HH Arranged: PT, OT, Social Work [employer] Agency: [place] Date HH Agency Contacted: 09/04/22 Time [place]: [place] Representative spoke with at [place]: [place] ([place]) Interventions SDOH Screenings   Food Insecurity: No Food Insecurity (08/31/2022)  Housing: Low Risk  (08/31/2022)  Transportation Needs: No Transportation Needs (08/31/2022)  Utilities: Not At Risk (08/31/2022)  Depression (PHQ2-9): Low Risk  (09/22/2019)  Tobacco Use: Medium Risk  (09/03/2022)    Readmission Risk Interventions     No data to display

## 2022-09-05 NOTE — Progress Notes (Signed)
Physical Therapy Treatment Patient Details Name: Kelly Phillips MRN: [id number] DOB: Jul 31, 1946 Today's Date: 09/05/2022   History of Present Illness 77 year old female admitted 08/30/22 with hematemesis. PMH GI bleed due to PUD, PAD, mesenteric artery stenosis, CAD, COPD, chronic hypoxic respiratory failure, legal blindness.    PT Comments    Pt presents today with impaired strength, balance, and endurance. Pt confused throughout session, slowly improving towards the end of the session. Pt requiring minA for all mobility today, able to ambulate ~4 feet to bedside chair with 1HHA, however desatting to ~85% on 4L O2 [place], recovering quickly with seated rest break. Encouraged pt to continue to mobilize, however pt reporting fatigue and requesting to rest. Pt left with all needs in reach and chair alarm on. Pt will continue to benefit from skilled acute PT at this time to progress mobility and endurance, current discharge recommendations remain appropriate.    Recommendations for follow up therapy are one component of a multi-disciplinary discharge planning process, led by the attending physician.  Recommendations may be updated based on patient status, additional functional criteria and insurance authorization.  Follow Up Recommendations  Skilled nursing-short term rehab (<3 hours/day) Can patient physically be transported by private vehicle: Yes   Assistance Recommended at Discharge Frequent or constant Supervision/Assistance  Patient can return home with the following A little help with walking and/or transfers;A lot of help with bathing/dressing/bathroom;Assistance with cooking/housework;Direct supervision/assist for medications management;Direct supervision/assist for financial management;Assist for transportation;Help with stairs or ramp for entrance   Equipment Recommendations  None recommended by PT    Recommendations for Other Services       Precautions / Restrictions  Precautions Precautions: Fall Precaution Comments: on 3.5L O2 at baseline. monitor HR Restrictions Weight Bearing Restrictions: No     Mobility  Bed Mobility Overal bed mobility: Needs Assistance Bed Mobility: Supine to Sit     Supine to sit: Min assist, HOB elevated     General bed mobility comments: use of side rails, verbal cueing for sequencing and for visual deficits, minA for pulling up and balance    Transfers Overall transfer level: Needs assistance Equipment used: 1 person hand held assist (declining use of RW today) Transfers: Sit to/from Stand, Bed to chair/wheelchair/BSC Sit to Stand: Min assist   Step pivot transfers: Min assist       General transfer comment: 1HHA and minA with verbal cueing for sequencing and visual deficits.    Ambulation/Gait Ambulation/Gait assistance: Min assist Gait Distance (Feet): 4 Feet Assistive device: 1 person hand held assist Gait Pattern/deviations: Step-through pattern, Decreased step length - right, Decreased step length - left, Shuffle, Trunk flexed, Wide base of support Gait velocity: decreased     General Gait Details: short shuffled steps with increased time in B stance phases (desatting to ~85% with ambulation, recovers quickly with seated rest break)   Stairs             Wheelchair Mobility    Modified Rankin (Stroke Patients Only)       Balance Overall balance assessment: Needs assistance Sitting-balance support: Feet supported, Bilateral upper extremity supported Sitting balance-Leahy Scale: Fair Sitting balance - Comments: increased trunk flexion with static sitting, close guard provided for balance, no sway noted with static sitting   Standing balance support: Single extremity supported Standing balance-Leahy Scale: Fair Standing balance comment: minA and 1HHA provided for balance, mild unsteadiness noted  Cognition Arousal/Alertness:  Awake/alert Behavior During Therapy: Impulsive Overall Cognitive Status: No family/caregiver present to determine baseline cognitive functioning Area of Impairment: Orientation, Memory, Following commands, Safety/judgement, Problem solving                 Orientation Level: Disoriented to, Place   Memory: Decreased short-term memory Following Commands: Follows one step commands with increased time Safety/Judgement: Decreased awareness of safety   Problem Solving: Slow processing, Decreased initiation, Requires verbal cues General Comments: Pt reoriented to place throughout session, pt finally accepting place at the end of the session, denying with initial reorientation attempts.        Exercises      General Comments        Pertinent Vitals/Pain Pain Assessment Pain Assessment: 0-10 Pain Score: 8  Pain Location: BLE Pain Descriptors / Indicators: Constant Pain Intervention(s): Monitored during session    Home Living                          Prior Function            PT Goals (current goals can now be found in the care plan section) Acute Rehab PT Goals Patient Stated Goal: Maybe get some rehab before going home PT Goal Formulation: Patient unable to participate in goal setting Time For Goal Achievement: 09/18/22 Potential to Achieve Goals: Good Progress towards PT goals: Progressing toward goals    Frequency    Min 3X/week      PT Plan Current plan remains appropriate    Co-evaluation              AM-PAC PT "6 Clicks" Mobility   Outcome Measure  Help needed turning from your back to your side while in a flat bed without using bedrails?: A Little Help needed moving from lying on your back to sitting on the side of a flat bed without using bedrails?: A Little Help needed moving to and from a bed to a chair (including a wheelchair)?: A Little Help needed standing up from a chair using your arms (e.g., wheelchair or bedside chair)?: A  Little Help needed to walk in hospital room?: A Little Help needed climbing 3-5 steps with a railing? : A Lot 6 Click Score: 17    End of Session Equipment Utilized During Treatment: Gait belt Activity Tolerance: Patient limited by fatigue Patient left: in chair;with call bell/phone within reach;with chair alarm set Nurse Communication: Mobility status PT Visit Diagnosis: Unsteadiness on feet (R26.81);Other abnormalities of gait and mobility (R26.89);Muscle weakness (generalized) (M62.81);Difficulty in walking, not elsewhere classified (R26.2)     Time: 5277-8242 PT Time Calculation (min) (ACUTE ONLY): 16 min  Charges:  $Therapeutic Activity: 8-22 mins                     Kelly Phillips, PT DPT Acute Rehabilitation Services Office [phone]    Luvenia Heller 09/05/2022, 1:23 PM

## 2022-09-05 NOTE — Progress Notes (Signed)
Placed SCD's on patient.  Bilateral.

## 2022-09-05 NOTE — Progress Notes (Signed)
Printer for printing prescriptions is currently not working.  Retrieved one prescription page with yellow attached from [employer] and gave to [place], Education officer, museum, for physician, Dr. Waldron Labs to complete so that patient can be discharged to [facility].

## 2022-09-06 ENCOUNTER — Encounter (HOSPITAL_COMMUNITY): Payer: Self-pay | Admitting: Gastroenterology

## 2022-09-08 DIAGNOSIS — I2510 Atherosclerotic heart disease of native coronary artery without angina pectoris: Secondary | ICD-10-CM | POA: Diagnosis not present

## 2022-09-08 DIAGNOSIS — I739 Peripheral vascular disease, unspecified: Secondary | ICD-10-CM | POA: Diagnosis not present

## 2022-09-08 DIAGNOSIS — K279 Peptic ulcer, site unspecified, unspecified as acute or chronic, without hemorrhage or perforation: Secondary | ICD-10-CM | POA: Diagnosis not present

## 2022-09-08 DIAGNOSIS — K922 Gastrointestinal hemorrhage, unspecified: Secondary | ICD-10-CM | POA: Diagnosis not present

## 2022-09-09 DIAGNOSIS — R69 Illness, unspecified: Secondary | ICD-10-CM | POA: Diagnosis not present

## 2022-09-09 DIAGNOSIS — J449 Chronic obstructive pulmonary disease, unspecified: Secondary | ICD-10-CM | POA: Diagnosis not present

## 2022-09-09 DIAGNOSIS — K551 Chronic vascular disorders of intestine: Secondary | ICD-10-CM | POA: Diagnosis not present

## 2022-09-09 DIAGNOSIS — M62838 Other muscle spasm: Secondary | ICD-10-CM | POA: Diagnosis not present

## 2022-09-09 DIAGNOSIS — F419 Anxiety disorder, unspecified: Secondary | ICD-10-CM | POA: Diagnosis not present

## 2022-09-10 ENCOUNTER — Other Ambulatory Visit (HOSPITAL_COMMUNITY): Payer: Self-pay

## 2022-09-11 DIAGNOSIS — G4700 Insomnia, unspecified: Secondary | ICD-10-CM | POA: Diagnosis not present

## 2022-09-11 DIAGNOSIS — K5900 Constipation, unspecified: Secondary | ICD-10-CM | POA: Diagnosis not present

## 2022-09-11 DIAGNOSIS — D649 Anemia, unspecified: Secondary | ICD-10-CM | POA: Diagnosis not present

## 2022-09-11 DIAGNOSIS — D72829 Elevated white blood cell count, unspecified: Secondary | ICD-10-CM | POA: Diagnosis not present

## 2022-09-25 DEATH — deceased
